# Patient Record
Sex: Male | Born: 1960 | Race: White | Hispanic: No | Marital: Married | Smoking: Former smoker
Health system: Southern US, Community
[De-identification: ages and names within clinical notes are randomized; demographics above are authoritative.]

## PROBLEM LIST (undated history)

## (undated) DIAGNOSIS — I2510 Atherosclerotic heart disease of native coronary artery without angina pectoris: Secondary | ICD-10-CM

## (undated) DIAGNOSIS — D351 Benign neoplasm of parathyroid gland: Secondary | ICD-10-CM

## (undated) DIAGNOSIS — E039 Hypothyroidism, unspecified: Secondary | ICD-10-CM

## (undated) DIAGNOSIS — E079 Disorder of thyroid, unspecified: Secondary | ICD-10-CM

## (undated) DIAGNOSIS — I10 Essential (primary) hypertension: Secondary | ICD-10-CM

## (undated) DIAGNOSIS — Z87891 Personal history of nicotine dependence: Secondary | ICD-10-CM

## (undated) DIAGNOSIS — M1990 Unspecified osteoarthritis, unspecified site: Secondary | ICD-10-CM

## (undated) DIAGNOSIS — G4730 Sleep apnea, unspecified: Secondary | ICD-10-CM

## (undated) DIAGNOSIS — I252 Old myocardial infarction: Secondary | ICD-10-CM

## (undated) DIAGNOSIS — E785 Hyperlipidemia, unspecified: Secondary | ICD-10-CM

## (undated) DIAGNOSIS — I472 Ventricular tachycardia, unspecified: Secondary | ICD-10-CM

## (undated) HISTORY — DX: Hyperlipidemia, unspecified: E78.5

## (undated) HISTORY — DX: Atherosclerotic heart disease of native coronary artery without angina pectoris: I25.10

## (undated) HISTORY — DX: Personal history of nicotine dependence: Z87.891

## (undated) HISTORY — PX: OTHER SURGICAL HISTORY: SHX169

## (undated) HISTORY — DX: Ventricular tachycardia, unspecified: I47.20

## (undated) HISTORY — DX: Ventricular tachycardia: I47.2

## (undated) HISTORY — DX: Old myocardial infarction: I25.2

## (undated) HISTORY — PX: CHOLECYSTECTOMY: SHX55

## (undated) HISTORY — DX: Benign neoplasm of parathyroid gland: D35.1

---

## 1998-03-22 HISTORY — PX: CARDIAC CATHETERIZATION: SHX172

## 1998-06-14 ENCOUNTER — Encounter: Payer: Self-pay | Admitting: Emergency Medicine

## 1998-06-14 ENCOUNTER — Inpatient Hospital Stay (HOSPITAL_COMMUNITY): Admission: AD | Admit: 1998-06-14 | Discharge: 1998-06-16 | Payer: Self-pay | Admitting: Cardiology

## 1998-06-14 ENCOUNTER — Emergency Department (HOSPITAL_COMMUNITY): Admission: EM | Admit: 1998-06-14 | Discharge: 1998-06-14 | Payer: Self-pay | Admitting: Emergency Medicine

## 2004-08-20 HISTORY — PX: CARDIAC CATHETERIZATION: SHX172

## 2004-09-03 ENCOUNTER — Ambulatory Visit: Payer: Self-pay | Admitting: Cardiology

## 2004-09-03 ENCOUNTER — Inpatient Hospital Stay (HOSPITAL_COMMUNITY): Admission: EM | Admit: 2004-09-03 | Discharge: 2004-09-07 | Payer: Self-pay | Admitting: *Deleted

## 2004-09-11 ENCOUNTER — Emergency Department: Payer: Self-pay | Admitting: Unknown Physician Specialty

## 2004-09-11 ENCOUNTER — Other Ambulatory Visit: Payer: Self-pay

## 2004-09-15 ENCOUNTER — Encounter: Payer: Self-pay | Admitting: Cardiology

## 2004-09-19 ENCOUNTER — Encounter: Payer: Self-pay | Admitting: Cardiology

## 2005-02-28 ENCOUNTER — Other Ambulatory Visit: Payer: Self-pay

## 2005-02-28 ENCOUNTER — Inpatient Hospital Stay: Payer: Self-pay | Admitting: Internal Medicine

## 2005-03-01 ENCOUNTER — Other Ambulatory Visit: Payer: Self-pay

## 2008-08-26 ENCOUNTER — Emergency Department: Payer: Self-pay | Admitting: Emergency Medicine

## 2008-11-20 HISTORY — PX: CARDIAC CATHETERIZATION: SHX172

## 2008-12-03 ENCOUNTER — Ambulatory Visit: Payer: Self-pay | Admitting: Internal Medicine

## 2008-12-03 ENCOUNTER — Inpatient Hospital Stay (HOSPITAL_COMMUNITY): Admission: EM | Admit: 2008-12-03 | Discharge: 2008-12-07 | Payer: Self-pay | Admitting: Emergency Medicine

## 2009-11-11 ENCOUNTER — Ambulatory Visit: Payer: Self-pay | Admitting: Cardiology

## 2010-05-15 ENCOUNTER — Ambulatory Visit: Payer: Self-pay | Admitting: Cardiology

## 2010-05-17 ENCOUNTER — Observation Stay: Payer: Self-pay | Admitting: Internal Medicine

## 2010-05-20 ENCOUNTER — Ambulatory Visit (INDEPENDENT_AMBULATORY_CARE_PROVIDER_SITE_OTHER): Payer: 59 | Admitting: Cardiology

## 2010-05-20 DIAGNOSIS — I472 Ventricular tachycardia: Secondary | ICD-10-CM

## 2010-05-20 DIAGNOSIS — I4949 Other premature depolarization: Secondary | ICD-10-CM

## 2010-05-20 DIAGNOSIS — I4729 Other ventricular tachycardia: Secondary | ICD-10-CM

## 2010-05-20 DIAGNOSIS — I2510 Atherosclerotic heart disease of native coronary artery without angina pectoris: Secondary | ICD-10-CM

## 2010-06-26 LAB — COMPREHENSIVE METABOLIC PANEL
Alkaline Phosphatase: 75 U/L (ref 39–117)
BUN: 16 mg/dL (ref 6–23)
Chloride: 104 mEq/L (ref 96–112)
GFR calc non Af Amer: >60 mL/min (ref >60)
Glucose, Bld: 188 mg/dL — ABNORMAL HIGH (ref 70–99)
Potassium: 4.1 mEq/L (ref 3.5–5.1)
Total Bilirubin: 0.9 mg/dL (ref 0.3–1.2)

## 2010-06-26 LAB — CBC
HCT: 41.3 % (ref 39.0–52.0)
Hemoglobin: 14.3 g/dL (ref 13.0–17.0)
MCHC: 34.7 g/dL (ref 30.0–36.0)
MCV: 97.7 fL (ref 78.0–100.0)
Platelets: 191 10*3/uL (ref 150–400)
RBC: 4.22 MIL/uL (ref 4.22–5.81)
RDW: 13 % (ref 11.5–15.5)
WBC: 7.4 10*3/uL (ref 4.0–10.5)

## 2010-06-26 LAB — DIFFERENTIAL
Basophils Absolute: 0 10*3/uL (ref 0.0–0.1)
Basophils Relative: 0 % (ref 0–1)
Neutro Abs: 4.8 10*3/uL (ref 1.7–7.7)
Neutrophils Relative %: 65 % (ref 43–77)

## 2010-06-26 LAB — GLUCOSE, CAPILLARY: Glucose-Capillary: 136 mg/dL — ABNORMAL HIGH (ref 70–99)

## 2010-06-26 LAB — PROTIME-INR
INR: 1 (ref 0.00–1.49)
Prothrombin Time: 13.1 seconds (ref 11.6–15.2)

## 2010-08-07 NOTE — Discharge Summary (Signed)
NAMEESSEX, PERRY NO.:  [id number]   MEDICAL RECORD NO.:  [id number]          PATIENT TYPE:  INP   LOCATION:  2015                         FACILITY:  [facility]   PHYSICIAN:  [name]. Swaziland, M.D.  DATE OF BIRTH:  November 09, 1960   DATE OF ADMISSION:  09/03/2004  DATE OF DISCHARGE:  09/07/2004                                 DISCHARGE SUMMARY   HISTORY OF PRESENT ILLNESS:  Mr. Ladona Ridgel is a 50 year old white male who has  known history of coronary artery disease. He had had previous stenting of  the proximal right coronary artery and distal right coronary artery. He has  a history of diabetes mellitus type 2 and dyslipidemia. He has not had any  recent cardiac follow-up. He presented to the emergency room on 09/03/2004  with complaints of chest pain involving his left upper chest and a focal  area in his left anterior shoulder. Initial ECG showed minimal ST-segment  changes. The patient became pain-free without treatment. However, he was  subsequently noted on the monitor to have significant ST elevation and  repeat ECG showed marked ST elevation in the inferior leads consistent with  acute inferior myocardial infarction. The patient was admitted for further  treatment. For details of his past medical history, social history, family  history, and physical exam, please see admission history and physical.   LABORATORY DATA:  Hemoglobin was 14.4, hematocrit 41.4, white count 6400,  platelets 191,000. Sodium 140, potassium 4.0, chloride 108, CO2 28, BUN 11,  creatinine 1.0, glucose of 164. Coag's were normal, liver function studies  were normal with the exception of AST of 63, albumin was 3.6. Calcium was  8.2. TSH was normal at 4.971. Initial point of care cardiac enzymes showed a  myoglobin of 82.9, CPK MB was less than 1, and troponin was less than 0.05.  Subsequent serial cardiac enzymes showed a peak CPK of 1099 with 149.6 MB.  Troponin increased to a peak of 32.35  before both of these values and  declined further. Serum cholesterol was 146 with an LDL 68, HDL 28, and  triglycerides of 250.   HOSPITAL COURSE:  The patient was treated in the emergency room with  aspirin, IV heparin, and IV nitroglycerin. He was taken emergently to the  cardiac catheterization laboratory where coronary angiography showed mild  nonobstructive disease throughout the left coronary system. He had a total  occlusion of the distal right coronary artery prior to the PDA. The proximal  stented segment was still patent. He was started on Integrilin and underwent  intervention of the distal right coronary artery. This was opened and  stented with a 2.5 x 24 mm Taxus stent and then in overlapping fashion,  slightly more proximal a 2.75 x 20 mm Taxus stent. An excellent angiographic  result was obtained with TIMI grade 3 flow and no evidence of distal cut-off  or thrombus. The patient's pain resolved, his ST-segment resolved. He had no  significant groin complications. His subsequent ECG shows resolution of his  ST elevation but he did have development of Q-waves in leads III and  aVF.  The patient was kept on Integrilin for 18 hours. His IV nitroglycerin was  weaned off. He had no recurrent chest pain. He had no significant  arrhythmias. He was treated with increased beta blocker therapy. An ACE  inhibitor was begun. He was placed on high dose Statin therapy. His  Glucophage was held until the time of discharge, at which time it was  resumed. The patient had no significant arrhythmias. It was felt at this  point, he was stable for discharge and he was discharged home on 09/07/2004.   DISCHARGE DIAGNOSES:  1.  Acute inferior myocardial infarction.  2.  Adult onset diabetes mellitus.  3.  Dyslipidemia.   DISCHARGE MEDICATIONS:  1.  Enteric-coated aspirin 325 milligrams daily.  2.  Plavix 75 milligrams per day.  3.  Altace 2.5 milligrams daily.  4.  Metoprolol 50 milligrams  twice a day.  5.  Effexor 150 milligrams daily.  6.  Lipitor were 80 milligrams per day.  7.  Niaspan 500 milligrams q.h.s.  8.  Actos and metformin 15/500 milligrams twice a day.  9.  Nitroglycerin p.r.n.   DISCHARGE INSTRUCTIONS:  The patient was referred to phase II cardiac rehab,  but states that he would be able to do this exercise on his own at home. He  recommended he remain on carbohydrate-modified, low-fat diet. He may not  return to work or drive for 2 weeks and will be reassessed at that point. If  he is able to return to work, he will need to have restrictions on lifting,  hours, and exposure to heat. He will follow-up with Dr. Swaziland in 2 weeks.       PMJ/MEDQ  D:  09/07/2004  T:  09/07/2004  Job:  [id number]   cc:   Julieanne Manson  [address]  [place]  [place] [address]  Fax: [phone]   Carole Binning, M.D. [facility]

## 2010-08-07 NOTE — H&P (Signed)
NAMEFELICIA, BOTH NO.:  [id number]   MEDICAL RECORD NO.:  [id number]          PATIENT TYPE:  INP   LOCATION:  1823                         FACILITY:  [facility]   PHYSICIAN:  Rollene Rotunda, M.D.   DATE OF BIRTH:  Mar 25, 1960   DATE OF ADMISSION:  09/03/2004  DATE OF DISCHARGE:                                HISTORY & PHYSICAL   REASON FOR PRESENTATION:  Chest pain.   HISTORY OF PRESENT ILLNESS:  The patient is a pleasant 50 year old gentleman  with a past history of coronary artery disease and an acute coronary  syndrome in 1999.  At that time, he had 80% proximal and 99% distal right  coronary stenosis (before the PDA.  He received two stents.  Following this,  he did very well.  He stopped smoking.  He has had follow up stress tests,  the most recent he reports three years ago.  This apparently was a nuclear  study which was negative.  He has had no symptoms.  He remains active.  He  works as a Chiropodist.  With this, he denies any chest discomfort,  shortness of breath, or other anginal symptoms.  Today, after eating lunch,  he felt some left upper chest discomfort.  It was slightly different than  his previous angina.  It was 2 out of 10 in intensity.  However, he had some  radiation to his jaw.  His wife informs me that he probably had some of this  over the weekend, as well.  He presented to the emergency room  where he  spontaneously became pain free.  Initial EKG showed some very mild inferior  ST elevation that was very subtle.  However, follow up EKG blossomed into 3-  4 mm ST elevation in 2, 3, and AVF, 1-2 mm ST elevation in v4, v5, with  reciprocal ST depression in the anterior precordial leads.   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   PAST SURGICAL HISTORY:  Cholecystectomy, parathyroidectomy for  hypercalcemia, tonsillectomy.   ALLERGIES:   Penicillin.   MEDICATIONS:  Lipitor 10 mg daily, Metoprolol b.i.d., aspirin, Effexor 150  mg daily, Actos Plus (the patient does not recall all the doses).   SOCIAL HISTORY:  The patient is married, he has three children.  He works as  a Copywriter, advertising for [employer].  He quit smoking in 1999.   FAMILY HISTORY:  Noncontributory for early coronary artery disease.   REVIEW OF SYMPTOMS:  As stated in the HPI and negative for all other  systems.   PHYSICAL EXAMINATION:  GENERAL:  The patient is in no distress.  He has an appropriate affect.  VITAL SIGNS:  Blood pressure 120/70, heart rate 80 and regular, afebrile,  HEENT:  Eyes unremarkable.  Pupils equal, round, reactive to light.  Fundi  not visualized.  Oral mucosa unremarkable.  NECK:  No jugular venous distention at 45 degrees, carotid upstroke brisk  and symmetric, no bruits, no thyromegaly.  LYMPHATICS:  No cervical, axillary, or inguinal adenopathy.  LUNGS:  Clear to auscultation bilaterally.  BACK:  No costovertebral angle tenderness.  CHEST:  Unremarkable.  HEART:  PMI non displaced or sustained.  S1 and S2 within normal limits.  No  S3, S4, no rubs.  ABDOMEN:  Flat, positive bowel sounds, normal in frequency and pitch, no  rebound, no guarding, no midline pulsatile mass, no organomegaly.  SKIN:  No rashes.  EXTREMITIES:  2+ pulses throughout, no edema, no cyanosis, no clubbing.  NEUROLOGICAL:  Oriented to person, place and time.  Cranial nerves 2-12  grossly intact.  Motor grossly intact.   LABORATORY DATA:  EKG as described above.  Chest x-ray no air space disease.  WBC 6.4, hemoglobin 14.4, platelets 199, INR 1, sodium 138, potassium 4.4,  BUN 18, creatinine 1, initial point of care markers negative.   ASSESSMENT/PLAN:  1.  Acute inferior myocardial infarction.  The patient will be taken      urgently to the cardiac catheterization lab.  The risks and benefits      have been described.  He is being managed with aspirin and  heparin      currently.  He will receive Plavix and 2B3A inhibitors as needed in the      lab.  2.  Diabetes mellitus.  The patient will continue on his oral medicines      holding Glucophage the next 48 hours, we will cover him with sliding      scale insulin.  3.  Dyslipidemia.  We will continue his Lipitor and have a lipid profile.  4.  The patient will be followed by Dr. Swaziland.       JH/MEDQ  D:  09/03/2004  T:  09/03/2004  Job:  [id number]   cc:   Julieanne Manson  [address]  [place]  [place] [address]  Fax: [phone]   [name]. Swaziland, M.D.  [address]  [place], [place] [address]  Fax: [phone]

## 2010-08-07 NOTE — Cardiovascular Report (Signed)
[name], [name] NO.:  [id number]   MEDICAL RECORD NO.:  [id number]          PATIENT TYPE:  INP   LOCATION:  2922                         FACILITY:  [facility]   PHYSICIAN:  Carole Binning, M.D. LHCDATE OF BIRTH:  1961/01/22   DATE OF PROCEDURE:  09/03/2004  DATE OF DISCHARGE:                              CARDIAC CATHETERIZATION   PROCEDURE PERFORMED:  1.  Left heart catheterization with coronary angiography and left      ventriculography.  2.  PTCA with placement of drug-eluting stents x2 in the distal right      coronary.   INDICATIONS:  The patient is a 43-year male with history of coronary disease  and previous stents to the right coronary in 2000. He presented to the  emergency room this afternoon with substernal chest pain. EKG was consistent  with an acute inferolateral wall myocardial infarction. The patient was  brought emergently to the cardiac catheterization laboratory.   CATHETERIZATION PROCEDURE NOTE:  A 6-French sheath was placed in the right  femoral artery. Coronary angiography was performed using a standard Judkins  6-French catheters. Left ventriculography was performed with an angled  pigtail catheter. Contrast was Omnipaque. There were no complications.   HEMODYNAMIC RESULTS:  Left ventricular pressure 110/25. Aortic pressure  112/74. There is no aortic valve gradient.   LEFT VENTRICULOGRAM:  There is moderate akinesis of the inferior wall.  Ejection fraction calculated at 45%. There is no mitral regurgitation.   CORONARY ARTERIOGRAPHY:  1.  Left main is normal.  2.  Left anterior descending artery has a 30% stenosis mid vessel. LAD gives      rise to single small diagonal branch.  3.  Left circumflex gives rise to a large first obtuse marginal, a normal-      sized bifurcating second obtuse marginal branch. There is a diffuse 30%      stenosis in the mid to distal circumflex.  4.  Right coronary is a dominant vessel. There is a  60% stenosis in the      proximal vessel. In the mid vessel there is a stent with approximately      50% stenosis within the stent. In the distal vessel there is a long 70%      stenosis which begins proximal to the stent and extends to within the      previously placed in the distal vessel. The distal vessels is 100%      occluded at the distal edge of the previously placed stent. There is      TIMI 0 flow beyond it. Prior to occlusion, the distal right coronary      gives rise to large acute marginal branch which supplies the apical      portion of the inferior septum. Distal to the occlusion, the right      coronary gives rise to a small posterior descending artery and a large      posterolateral branch.   IMPRESSION:  1.  Mildly decreased left ventricular systolic function secondary to the      acute inferolateral wall myocardial infarction.  2.  One-vessel coronary disease characterized by 100% occlusion of the      distal right coronary.   PLAN:  Percutaneous coronary intervention of the right coronary.   PTCA PROCEDURE NOTE:  We utilized a 6-French sheath in the right femoral  artery. Heparin and Integrilin were administered per protocol. We used a 6-  [place] JR-4 guiding catheter. A Hi-Torque floppy wire was advanced under  fluoroscopic guidance beyond the occlusion. The distal portion of this wire  was advanced into the posterior descending artery. We then performed a  single pass with an export thrombectomy catheter. This was successful in  reestablishing perfusion into the distal vessel. We then advanced an [employer]  soft coronary guidewire beyond the distal right coronary into the large  posterolateral branch. We then performed PTCA of the distal right coronary  over the [facility] wire with a 2.5 x 20-mm Maverick balloon inflated to 20  atmospheres. We then removed our Hi-Torque floppy wire from the posterior  descending artery. We advanced a 2.5 x 24 mm Taxus drug-eluting stent  and  positioned this across the more distal aspect of the disease extending the  stent into the distal AV groove portion of the right coronary. The stent was  deployed at 9 atmospheres. We then went in with a 2.5 x 15 mm Quantum  balloon and inflated this to 15 atmospheres in the distal aspect of stent  and 20 atmospheres in the proximal aspect of stent. The stent did overlap  with the old previously placed stent in the right coronary. Following this,  we advanced a 2.75 x 20 mm Taxus drug-eluting stent and positioned this  proximal to the first stent in the distal vessel with overlap of the just  placed Taxus stent. We deployed the stent at 16 atmospheres. We then went  back with a 3.0 x 20 mm Quantum balloon and inflated this to 20 atmospheres  within the newly placed stent as well as 20 atmospheres in the area of  overlap of the two stents. Intermittent doses of intracoronary nitroglycerin  were administered. Final angiographic images were obtained showing patency  right coronary with 0% residual stenosis at the stent site and TIMI III flow  into the distal vessel.   COMPLICATIONS:  None.   RESULTS:  Successful percutaneous transluminal coronary angioplasty with  placement of two drug-eluting stents in the distal right coronary. A 100%  occlusion with TIMI zero flow was reduced to 0% residual with TIMI III flow.   PLAN:  Integrilin will be continued for 24 hours. The patient will be  treated with Plavix for a minimum of 12 months. He would also benefit from  aggressive risk factor modification.       MWP/MEDQ  D:  09/03/2004  T:  09/03/2004  Job:  [id number]   cc:   Rollene Rotunda, M.D.

## 2010-10-27 ENCOUNTER — Other Ambulatory Visit: Payer: Self-pay | Admitting: *Deleted

## 2010-10-27 MED ORDER — SOTALOL HCL 120 MG PO TABS: 120.0000 mg | ORAL_TABLET | Freq: Two times a day (BID) | ORAL | Status: DC

## 2010-10-27 NOTE — Telephone Encounter (Signed)
escribe medication per fax request  

## 2011-01-07 ENCOUNTER — Other Ambulatory Visit: Payer: Self-pay | Admitting: Cardiology

## 2011-01-11 ENCOUNTER — Other Ambulatory Visit: Payer: Self-pay

## 2011-01-25 ENCOUNTER — Other Ambulatory Visit: Payer: Self-pay | Admitting: Cardiology

## 2011-04-22 ENCOUNTER — Other Ambulatory Visit: Payer: Self-pay | Admitting: Cardiology

## 2011-05-17 IMAGING — CT CT CHEST W/ CM
1 series · 16 of 33 positions shown, 20 images · IV contrast (APPLIED)
Comparison: none

REASON FOR EXAM: chest pain elevated d diamer
COMMENTS:

PROCEDURE:     CT  - CT CHEST (FOR PE) W  - May 16, 2010  [DATE]
RESULT:
TECHNIQUE: Helical 3 mm sections were obtained from the thoracic inlet
through the lung bases status post intravenous administration of 100 ml of
Msovue-[id number].

[Series 4: soft tissue · axial · 0.74mm/px · z∈[-278,+22]mm · 16 of 108 slices shown, 20 images]
[im 4/108  mediastinal]
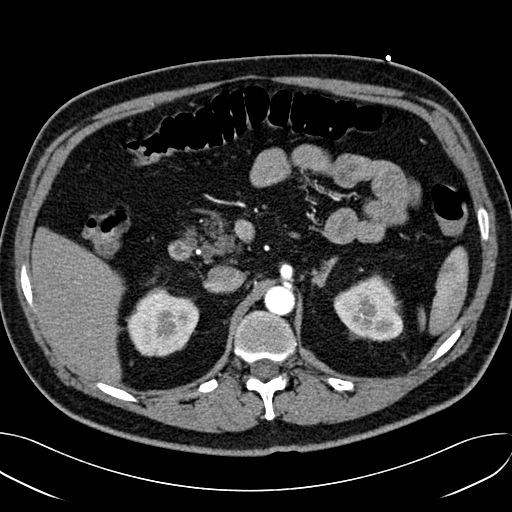
[im 4/108  lung]
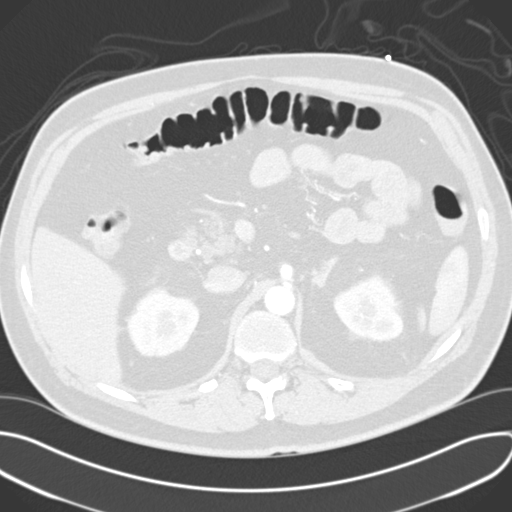
[im 12/108  lung]
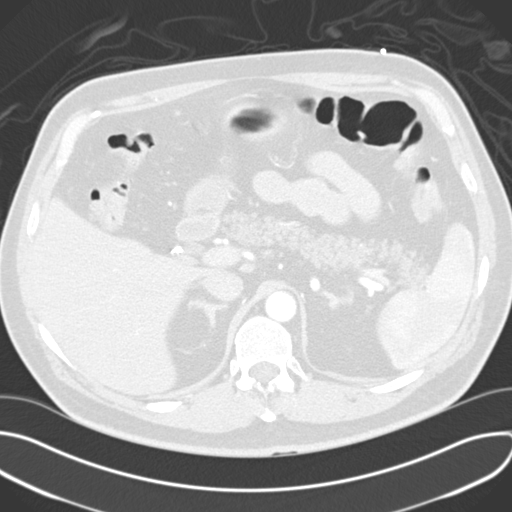
[im 20/108  lung]
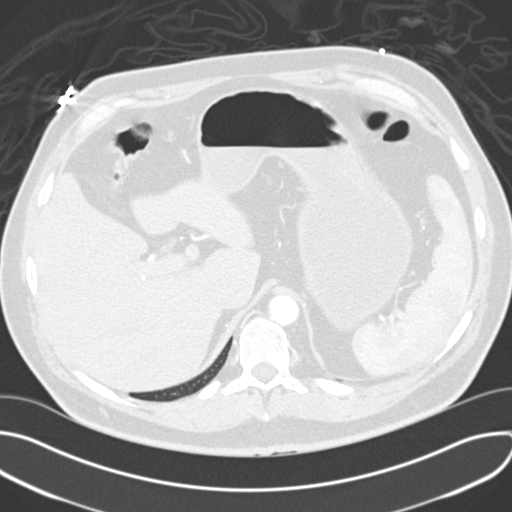
[im 24/108  lung]
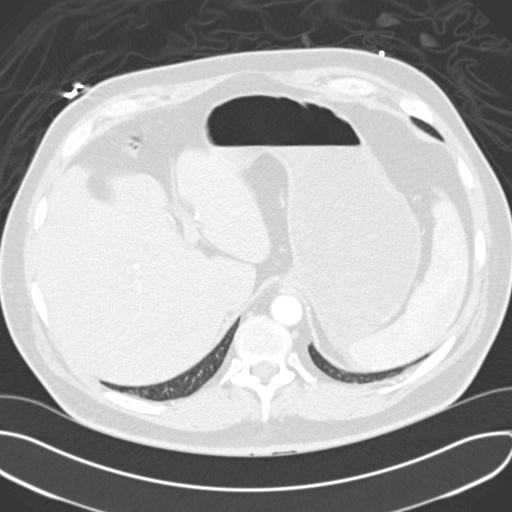
[im 32/108  mediastinal]
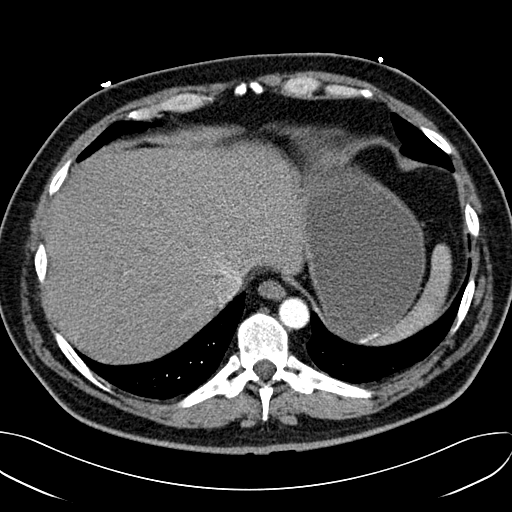
[im 32/108  lung]
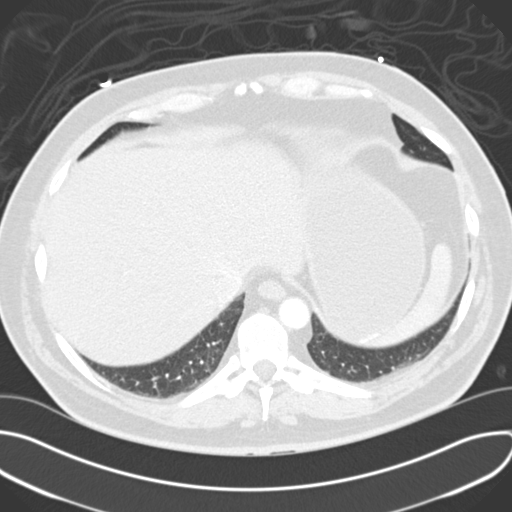
[im 40/108  lung]
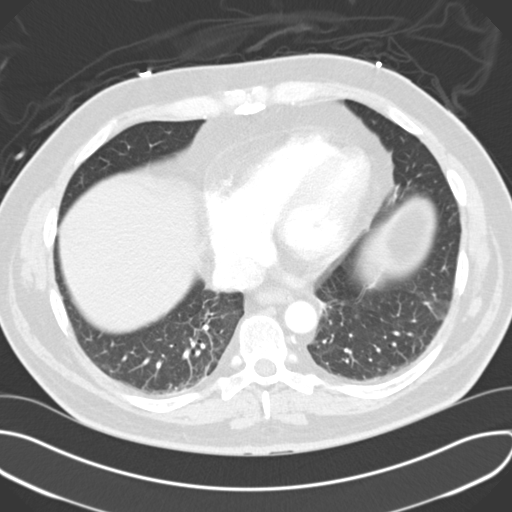
[im 44/108  lung]
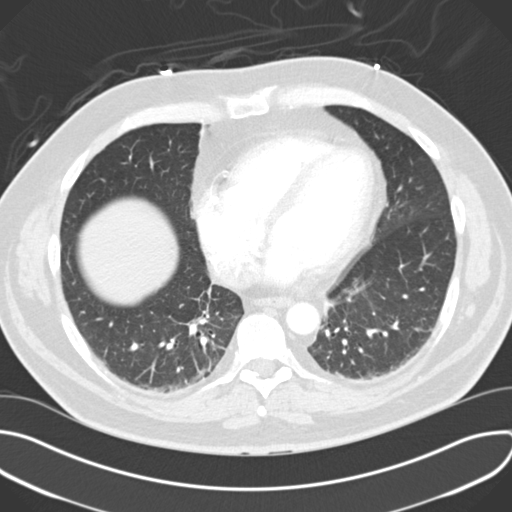
[im 52/108  lung]
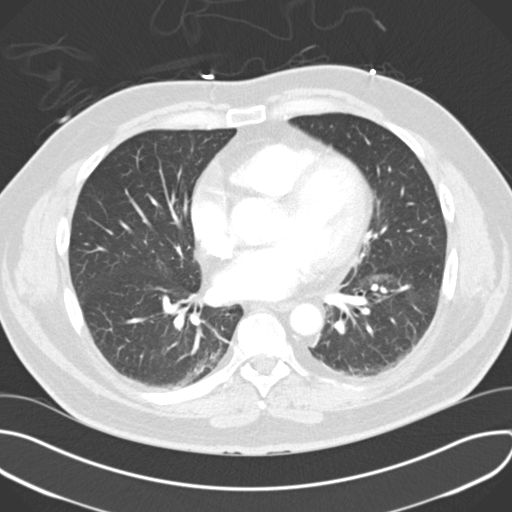
[im 57/108  mediastinal]
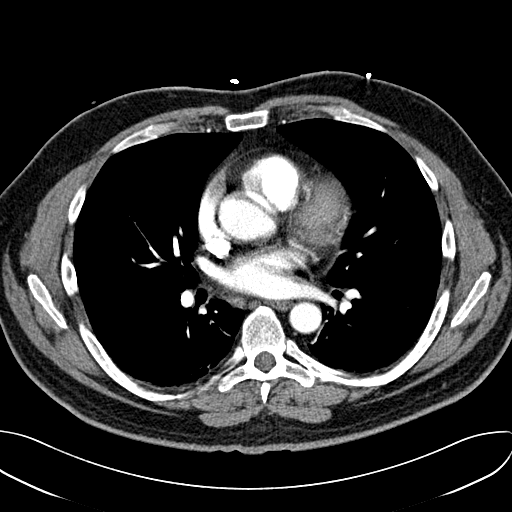
[im 57/108  lung]
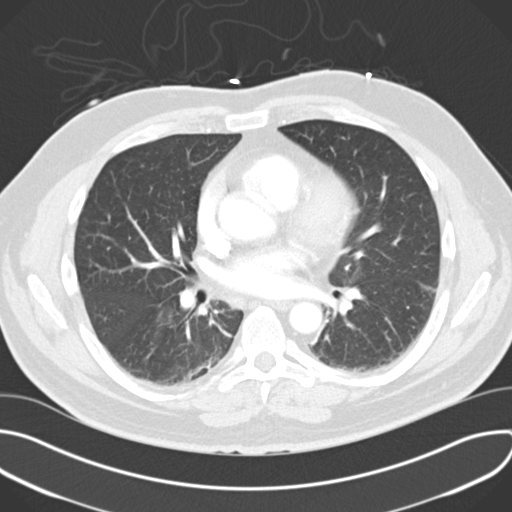
[im 64/108  lung]
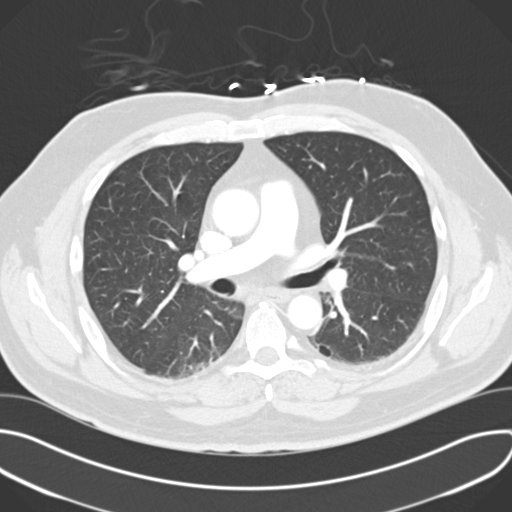
[im 68/108  lung]
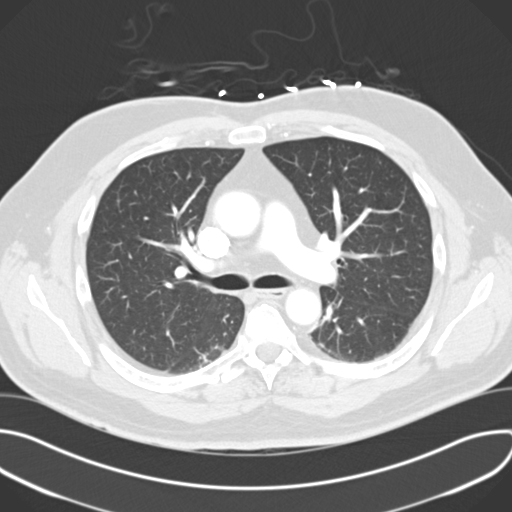
[im 76/108  lung]
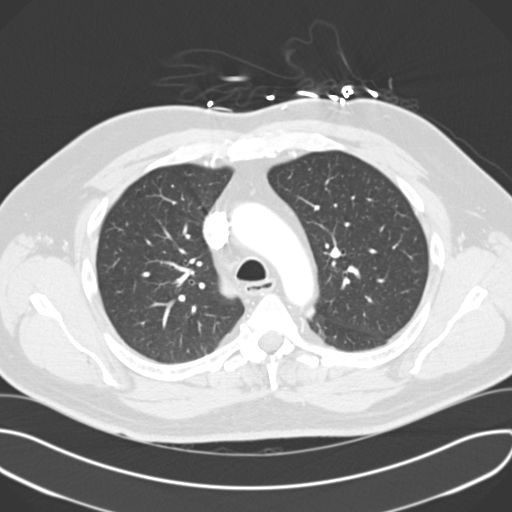
[im 84/108  mediastinal]
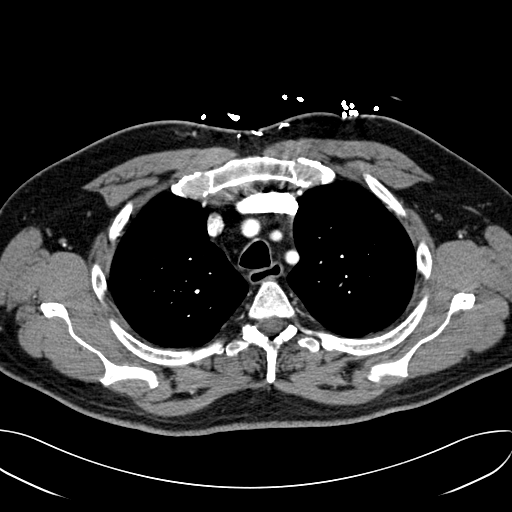
[im 84/108  lung]
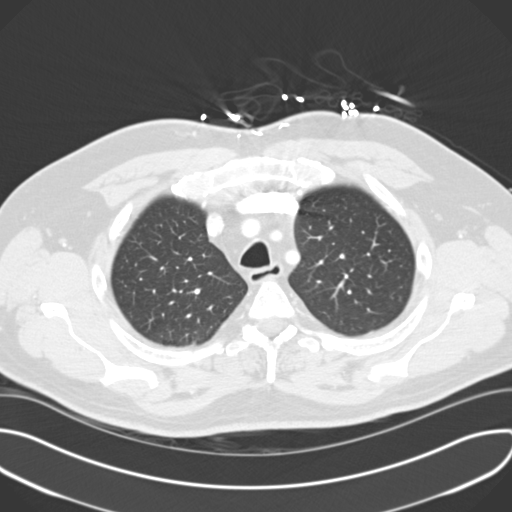
[im 88/108  lung]
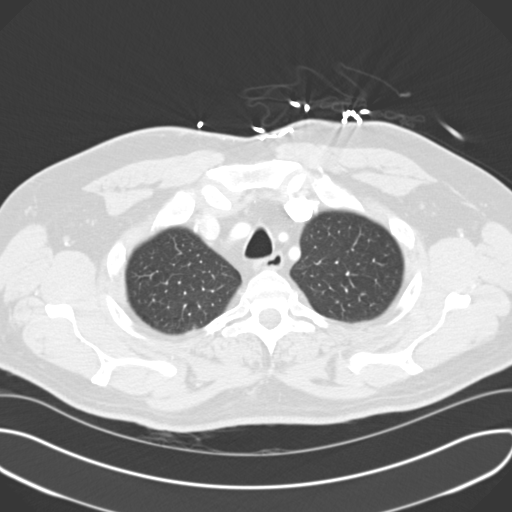
[im 96/108  lung]
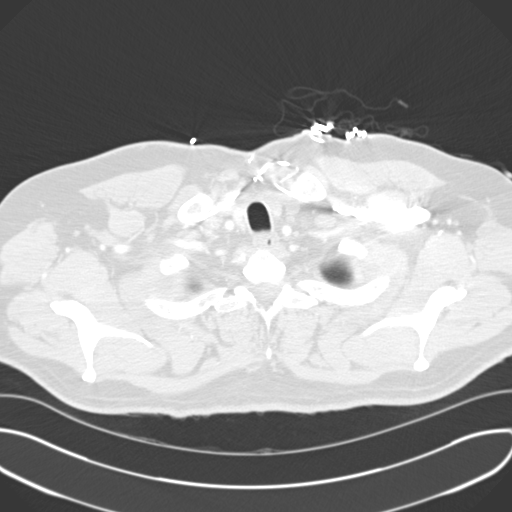
[im 104/108  lung]
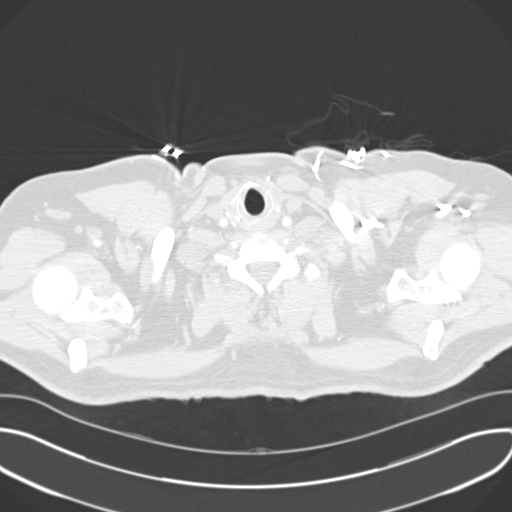

[16 of 33 positions shown; findings below may reference images not displayed]

FINDINGS: Evaluation of the mediastinum and hilar regions and structures
demonstrates no evidence of mediastinal or hilar adenopathy nor masses.
There is no evidence of filling defects within the main, lobar, or segmental
pulmonary arteries. The lung parenchyma demonstrates no evidence of focal
infiltrates, effusions or edema. Hypoventilation is appreciated within the
right lung base. The visualized upper abdominal viscera demonstrate no gross
abnormalities.
IMPRESSION: 1. No CT evidence of pulmonary arterial embolic disease.
2. No evidence of focal or acute abnormalities.

Dr. Paetzel of the Emergency Department was informed of these findings via
a preliminary faxed report.

## 2011-06-29 ENCOUNTER — Encounter: Payer: Self-pay | Admitting: Cardiology

## 2011-06-29 ENCOUNTER — Ambulatory Visit (INDEPENDENT_AMBULATORY_CARE_PROVIDER_SITE_OTHER): Payer: 59 | Admitting: Cardiology

## 2011-06-29 VITALS — BP 118/72 | HR 69 | Ht 70.0 in | Wt 211.0 lb

## 2011-06-29 DIAGNOSIS — I2510 Atherosclerotic heart disease of native coronary artery without angina pectoris: Secondary | ICD-10-CM

## 2011-06-29 DIAGNOSIS — I4729 Other ventricular tachycardia: Secondary | ICD-10-CM

## 2011-06-29 DIAGNOSIS — I252 Old myocardial infarction: Secondary | ICD-10-CM

## 2011-06-29 DIAGNOSIS — I472 Ventricular tachycardia: Secondary | ICD-10-CM | POA: Insufficient documentation

## 2011-06-29 DIAGNOSIS — E1169 Type 2 diabetes mellitus with other specified complication: Secondary | ICD-10-CM | POA: Insufficient documentation

## 2011-06-29 DIAGNOSIS — E785 Hyperlipidemia, unspecified: Secondary | ICD-10-CM

## 2011-06-29 DIAGNOSIS — E119 Type 2 diabetes mellitus without complications: Secondary | ICD-10-CM

## 2011-06-29 MED ORDER — ASPIRIN 325 MG PO TBEC: 325.0000 mg | DELAYED_RELEASE_TABLET | Freq: Every day | ORAL | Status: DC

## 2011-06-29 NOTE — Assessment & Plan Note (Signed)
His ventricular tachycardia is well controlled on his current dose of Betapace. I suspect that some of his episodes of lightheadedness are related to recurrent arrhythmia but these only last a couple of seconds at most. His QTC is normal. We will continue on his current therapy.

## 2011-06-29 NOTE — Progress Notes (Signed)
[name] Date of Birth: [birth year] Medical Record #[id number]  History of Present Illness: [name] is seen for yearly followup. He has a significant history of coronary disease. He had his first myocardial infarction at age 51. At that time he had stenting of the proximal right coronary with a 4.0 x 13 mm MultiLink stent in the distal vessel with a 3.0 x 8 mm MultiLink stent. In June of 2010 he again had an acute inferior myocardial infarction related to occlusion of the distal right coronary. The distal vessel was stented with a 2.5 x 24 mm Taxus stent and a 2.75 x 20 mm Taxus stent at the crux. He had followup cardiac catheterization in September of 2010 which demonstrated nonobstructive disease and ejection fraction of 55%. He also has a history of symptomatic monomorphic ventricular tachycardia that has been treated with Betapace therapy. On followup today he reports that he is doing well. Occasionally he will get a strange feeling in his stomach like riding a roller coaster. He feels a little bit lightheaded. It only lasts 1-2 seconds. He has had no chest pain. He denies any dyspnea. He admits that he is not doing well with his diet and eating a lot of sweets. His last A1c was 8% in January. He cannot recall when his lipids were last checked. He has not been exercising to any extent.   Current Outpatient Prescriptions on File Prior to Visit  Medication Sig Dispense Refill  . atorvastatin (LIPITOR) 80 MG tablet TAKE 1 TABLET AT BEDTIME  90 tablet  2  . ramipril (ALTACE) 2.5 MG capsule TAKE 1 CAPSULE DAILY  90 capsule  2  . sotalol (BETAPACE) 120 MG tablet TAKE 1 TABLET TWICE A DAY  180 tablet  2    Allergies  Allergen Reactions  . Crestor (Rosuvastatin Calcium)     ITCHING AND PALPITATIONS  . Penicillins     Past Medical History  Diagnosis Date  . Coronary artery disease   . Ventricular tachycardia     Symptomatic ventricular tachycardia  . Old Inferior Myocardial Infarction     . Diabetes mellitus   . Dyslipidemia   . History of tobacco use   . Gout   . Parathyroid adenoma     Past Surgical History  Procedure Date  . Cardiac catheterization     Ejection fraction is estimated at 55%  . Other surgical history     Status post removal of parathyroid adenoma  . Cholecystectomy     History  Smoking status  . Former Smoker  Smokeless tobacco  . Not on file    History  Alcohol Use     History reviewed. No pertinent family history.  Review of Systems: As noted in history of present illness.  All other systems were reviewed and are negative.  Physical Exam: BP 118/72  Pulse 69  Ht 5\' 10"  (1.778 m)  Wt 211 lb (95.709 kg)  BMI 30.28 kg/m2 He is an obese white male in no acute distress.The patient is alert and oriented x 3.  The mood and affect are normal.  The skin is warm and dry.  Color is normal.  The HEENT exam reveals that the sclera are nonicteric.  The mucous membranes are moist.  The carotids are 2+ without bruits.  There is no thyromegaly.  There is no JVD.  The lungs are clear.  The chest wall is non tender.  The heart exam reveals a regular rate with a normal S1 and  S2.  There are no murmurs, gallops, or rubs.  The PMI is not displaced.   Abdominal exam reveals good bowel sounds.  There is no guarding or rebound.  There is no hepatosplenomegaly or tenderness.  There are no masses.  Exam of the legs reveal no clubbing, cyanosis, or edema.  The legs are without rashes.  The distal pulses are intact.  Cranial nerves II - XII are intact.  Motor and sensory functions are intact.  The gait is normal.  LABORATORY DATA: ECG today demonstrates normal sinus rhythm with left axis deviation and evidence of old inferior myocardial infarction. He has poor weight progression in the anterior leads. His QTC is normal at 450 ms.  Assessment / Plan:

## 2011-06-29 NOTE — Patient Instructions (Signed)
You need to get regular aerobic exercise 4-5 days/week  Reduce your sweet intake.  Follow up with Dr. Sullivan Lone for your lab work.  Continue your medication  I will see you again in 1 year

## 2011-06-29 NOTE — Assessment & Plan Note (Signed)
Pardeep remains asymptomatic. He is on appropriate medications with aspirin, statin and ACE inhibitor. He is not on beta blocker therapy since he is on sotalol. He is really doing very little to modify his lifestyle. I stressed the importance of dietary modification with reduction in sweets intake. I recommended regular aerobic exercise at least 5 days per week. He needs to decrease his weight. I recommended that he have his lipids rechecked with his primary physician.

## 2011-09-10 ENCOUNTER — Other Ambulatory Visit: Payer: Self-pay | Admitting: *Deleted

## 2011-09-10 MED ORDER — ATORVASTATIN CALCIUM 80 MG PO TABS: 80.0000 mg | ORAL_TABLET | Freq: Every day | ORAL | Status: DC

## 2011-09-21 ENCOUNTER — Other Ambulatory Visit: Payer: Self-pay | Admitting: Cardiology

## 2011-10-05 ENCOUNTER — Encounter (HOSPITAL_COMMUNITY): Payer: Self-pay | Admitting: *Deleted

## 2011-10-05 ENCOUNTER — Inpatient Hospital Stay (HOSPITAL_COMMUNITY)
Admission: EM | Admit: 2011-10-05 | Discharge: 2011-10-06 | DRG: 287 | Disposition: A | Discharge status: Home / Self Care | Payer: 59 | Attending: Cardiovascular Disease | Admitting: Cardiovascular Disease

## 2011-10-05 ENCOUNTER — Emergency Department (HOSPITAL_COMMUNITY): Payer: 59

## 2011-10-05 DIAGNOSIS — Z880 Allergy status to penicillin: Secondary | ICD-10-CM

## 2011-10-05 DIAGNOSIS — E785 Hyperlipidemia, unspecified: Secondary | ICD-10-CM | POA: Diagnosis present

## 2011-10-05 DIAGNOSIS — E119 Type 2 diabetes mellitus without complications: Secondary | ICD-10-CM

## 2011-10-05 DIAGNOSIS — E039 Hypothyroidism, unspecified: Secondary | ICD-10-CM

## 2011-10-05 DIAGNOSIS — F419 Anxiety disorder, unspecified: Secondary | ICD-10-CM

## 2011-10-05 DIAGNOSIS — I472 Ventricular tachycardia, unspecified: Secondary | ICD-10-CM | POA: Diagnosis present

## 2011-10-05 DIAGNOSIS — I252 Old myocardial infarction: Secondary | ICD-10-CM

## 2011-10-05 DIAGNOSIS — R079 Chest pain, unspecified: Secondary | ICD-10-CM

## 2011-10-05 DIAGNOSIS — Z9861 Coronary angioplasty status: Secondary | ICD-10-CM

## 2011-10-05 DIAGNOSIS — E1169 Type 2 diabetes mellitus with other specified complication: Secondary | ICD-10-CM | POA: Diagnosis present

## 2011-10-05 DIAGNOSIS — I4729 Other ventricular tachycardia: Secondary | ICD-10-CM | POA: Diagnosis present

## 2011-10-05 DIAGNOSIS — I2510 Atherosclerotic heart disease of native coronary artery without angina pectoris: Secondary | ICD-10-CM | POA: Diagnosis present

## 2011-10-05 DIAGNOSIS — F411 Generalized anxiety disorder: Secondary | ICD-10-CM | POA: Diagnosis present

## 2011-10-05 DIAGNOSIS — R0789 Other chest pain: Principal | ICD-10-CM | POA: Diagnosis present

## 2011-10-05 DIAGNOSIS — Z87891 Personal history of nicotine dependence: Secondary | ICD-10-CM

## 2011-10-05 HISTORY — DX: Essential (primary) hypertension: I10

## 2011-10-05 HISTORY — DX: Unspecified osteoarthritis, unspecified site: M19.90

## 2011-10-05 HISTORY — DX: Sleep apnea, unspecified: G47.30

## 2011-10-05 HISTORY — DX: Disorder of thyroid, unspecified: E07.9

## 2011-10-05 HISTORY — DX: Hypothyroidism, unspecified: E03.9

## 2011-10-05 LAB — CBC
MCH: 33.2 pg (ref 26.0–34.0)
MCHC: 35.8 g/dL (ref 30.0–36.0)
Platelets: 165 10*3/uL (ref 150–400)

## 2011-10-05 LAB — BASIC METABOLIC PANEL
Calcium: 8.4 mg/dL (ref 8.4–10.5)
Creatinine, Ser: 0.81 mg/dL (ref 0.50–1.35)
GFR calc non Af Amer: >90 mL/min (ref >90)
Glucose, Bld: 264 mg/dL — ABNORMAL HIGH (ref 70–99)
Sodium: 138 mEq/L (ref 135–145)

## 2011-10-05 LAB — LIPID PANEL
Cholesterol: 108 mg/dL (ref 0–200)
Total CHOL/HDL Ratio: 2.9 RATIO
Triglycerides: 150 mg/dL — ABNORMAL HIGH (ref <150)

## 2011-10-05 LAB — CARDIAC PANEL(CRET KIN+CKTOT+MB+TROPI)
Relative Index: INVALID (ref 0.0–2.5)
Total CK: 82 U/L (ref 7–232)
Total CK: 81 U/L (ref 7–232)
Troponin I: <0.3 ng/mL (ref <0.30)
Troponin I: <0.3 ng/mL (ref <0.30)

## 2011-10-05 LAB — POCT I-STAT TROPONIN I: Troponin i, poc: 0 ng/mL (ref 0.00–0.08)

## 2011-10-05 LAB — GLUCOSE, CAPILLARY
Glucose-Capillary: 178 mg/dL — ABNORMAL HIGH (ref 70–99)
Glucose-Capillary: 239 mg/dL — ABNORMAL HIGH (ref 70–99)
Glucose-Capillary: 174 mg/dL — ABNORMAL HIGH (ref 70–99)

## 2011-10-05 LAB — MAGNESIUM: Magnesium: 1.3 mg/dL — ABNORMAL LOW (ref 1.5–2.5)

## 2011-10-05 MED ORDER — HEPARIN (PORCINE) IN NACL 100-0.45 UNIT/ML-% IJ SOLN
1100.0000 [IU]/h | INTRAMUSCULAR | Status: DC
  Administered 2011-10-05: 1100 [IU]/h via INTRAVENOUS
  Filled 2011-10-05 (×2): qty 250

## 2011-10-05 MED ORDER — SODIUM CHLORIDE 0.9 % IV SOLN
INTRAVENOUS | Status: DC
  Administered 2011-10-06: 04:00:00 via INTRAVENOUS

## 2011-10-05 MED ORDER — ASPIRIN 81 MG PO CHEW
324.0000 mg | CHEWABLE_TABLET | Freq: Once | ORAL | Status: AC
  Administered 2011-10-05: 324 mg via ORAL
  Filled 2011-10-05: qty 4

## 2011-10-05 MED ORDER — SODIUM CHLORIDE 0.9 % IJ SOLN: 3.0000 mL | INTRAMUSCULAR | Status: DC | PRN

## 2011-10-05 MED ORDER — ASPIRIN EC 81 MG PO TBEC
81.0000 mg | DELAYED_RELEASE_TABLET | Freq: Every day | ORAL | Status: DC
  Filled 2011-10-05: qty 1

## 2011-10-05 MED ORDER — SODIUM CHLORIDE 0.9 % IJ SOLN: 3.0000 mL | Freq: Two times a day (BID) | INTRAMUSCULAR | Status: DC

## 2011-10-05 MED ORDER — RAMIPRIL 2.5 MG PO CAPS
2.5000 mg | ORAL_CAPSULE | Freq: Every day | ORAL | Status: DC
  Filled 2011-10-05: qty 1

## 2011-10-05 MED ORDER — ONDANSETRON HCL 4 MG/2ML IJ SOLN
4.0000 mg | Freq: Four times a day (QID) | INTRAMUSCULAR | Status: DC | PRN
  Filled 2011-10-05: qty 2

## 2011-10-05 MED ORDER — ALPRAZOLAM 0.25 MG PO TABS: 0.2500 mg | ORAL_TABLET | Freq: Two times a day (BID) | ORAL | Status: DC | PRN

## 2011-10-05 MED ORDER — SODIUM CHLORIDE 0.9 % IV SOLN: 250.0000 mL | INTRAVENOUS | Status: DC | PRN

## 2011-10-05 MED ORDER — NITROGLYCERIN 0.4 MG SL SUBL: 0.4000 mg | SUBLINGUAL_TABLET | SUBLINGUAL | Status: DC | PRN

## 2011-10-05 MED ORDER — DIAZEPAM 5 MG PO TABS
5.0000 mg | ORAL_TABLET | ORAL | Status: AC
  Administered 2011-10-06: 5 mg via ORAL
  Filled 2011-10-05: qty 1

## 2011-10-05 MED ORDER — DIAZEPAM 5 MG PO TABS: 5.0000 mg | ORAL_TABLET | ORAL | Status: DC

## 2011-10-05 MED ORDER — ZOLPIDEM TARTRATE 5 MG PO TABS: 5.0000 mg | ORAL_TABLET | Freq: Every evening | ORAL | Status: DC | PRN

## 2011-10-05 MED ORDER — INSULIN ASPART 100 UNIT/ML ~~LOC~~ SOLN
0.0000 [IU] | Freq: Three times a day (TID) | SUBCUTANEOUS | Status: DC
  Administered 2011-10-05: 5 [IU] via SUBCUTANEOUS
  Administered 2011-10-06: 2 [IU] via SUBCUTANEOUS
  Filled 2011-10-05: qty 1

## 2011-10-05 MED ORDER — SOTALOL HCL 120 MG PO TABS
120.0000 mg | ORAL_TABLET | Freq: Two times a day (BID) | ORAL | Status: DC
  Administered 2011-10-05: 120 mg via ORAL
  Filled 2011-10-05 (×3): qty 1

## 2011-10-05 MED ORDER — HEPARIN BOLUS VIA INFUSION
4000.0000 [IU] | Freq: Once | INTRAVENOUS | Status: AC
  Administered 2011-10-05: 4000 [IU] via INTRAVENOUS

## 2011-10-05 MED ORDER — CLOPIDOGREL BISULFATE 300 MG PO TABS
600.0000 mg | ORAL_TABLET | Freq: Once | ORAL | Status: AC
  Administered 2011-10-05: 600 mg via ORAL
  Filled 2011-10-05: qty 2

## 2011-10-05 MED ORDER — ACETAMINOPHEN 325 MG PO TABS
650.0000 mg | ORAL_TABLET | ORAL | Status: DC | PRN
  Filled 2011-10-05: qty 2

## 2011-10-05 MED ORDER — ATORVASTATIN CALCIUM 80 MG PO TABS
80.0000 mg | ORAL_TABLET | Freq: Every day | ORAL | Status: DC
  Filled 2011-10-05: qty 1

## 2011-10-05 NOTE — ED Provider Notes (Signed)
I saw and evaluated the patient, reviewed the resident's note and I agree with the findings and plan.  Dollie Jonathan Corpus, MD 10/05/11 1600 

## 2011-10-05 NOTE — ED Provider Notes (Signed)
History     CSN: [id number]  Arrival date & time 10/05/11  7829   First MD Initiated Contact with Patient 10/05/11 [phone]      Chief Complaint  Patient presents with  . Chest Pain    (Consider location/radiation/quality/duration/timing/severity/associated sxs/prior treatment) HPI  Patient who has significant past medical history for coronary artery disease with stent placement as well as history of 2 myocardial infarctions with this first at age 51 the second in 2010 was followed by [facility] cardiology presents to emergency department complaining of a few day to one week history of intermittent discomfort in his left chest with associated "flushed feeling." Patient states that this morning he was lying in bed and had this intermittent fleeting discomfort in his left upper chest and would fell as if his chest "was flushed." Patient states that symptoms are fleeting. He denies associated fevers, chills, shortness of breath, diaphoresis, nausea, or vomiting. He denies abdominal pain. Patient took aspirin last night but has not had any aspirin today. Patient denies aggravating or alleviating factors. Patient denies any current pain.   Past Medical History  Diagnosis Date  . Coronary artery disease   . Ventricular tachycardia     Symptomatic ventricular tachycardia  . Old Inferior Myocardial Infarction   . Diabetes mellitus   . Dyslipidemia   . History of tobacco use   . Gout   . Parathyroid adenoma   . Thyroid disease     Past Surgical History  Procedure Date  . Cardiac catheterization     Ejection fraction is estimated at 55%  . Other surgical history     Status post removal of parathyroid adenoma  . Cholecystectomy     No family history on file.  History  Substance Use Topics  . Smoking status: Former Games developer  . Smokeless tobacco: Not on file  . Alcohol Use: No      Review of Systems  All other systems reviewed and are negative.    Allergies  Crestor and  Penicillins  Home Medications   Current Outpatient Rx  Name Route Sig Dispense Refill  . ASPIRIN 325 MG PO TABS Oral Take 325 mg by mouth daily.    . ATORVASTATIN CALCIUM 80 MG PO TABS Oral Take 1 tablet (80 mg total) by mouth daily. 90 tablet 3  . RAMIPRIL 2.5 MG PO CAPS Oral Take 2.5 mg by mouth daily.    Marland Kitchen SOTALOL HCL 120 MG PO TABS Oral Take 120 mg by mouth 2 (two) times daily.      BP 147/89  Pulse 77  Temp 98.4 F (36.9 C) (Oral)  Resp 20  SpO2 100%  Physical Exam  Nursing note and vitals reviewed. Constitutional: He is oriented to person, place, and time. He appears well-developed and well-nourished. No distress.  HENT:  Head: Normocephalic and atraumatic.  Eyes: Conjunctivae and EOM are normal. Pupils are equal, round, and reactive to light.  Neck: Normal range of motion. Neck supple.  Cardiovascular: Normal rate, regular rhythm, normal heart sounds and intact distal pulses.  Exam reveals no gallop and no friction rub.   No murmur heard. Pulmonary/Chest: Effort normal and breath sounds normal. No respiratory distress. He has no wheezes. He has no rales. He exhibits no tenderness.  Abdominal: Bowel sounds are normal. He exhibits no distension and no mass. There is no tenderness. There is no rebound and no guarding.  Musculoskeletal: Normal range of motion. He exhibits no edema and no tenderness.  Neurological: He is alert  and oriented to person, place, and time.  Skin: Skin is warm and dry. No rash noted. He is not diaphoretic. No erythema.  Psychiatric: He has a normal mood and affect.    ED Course  Procedures (including critical care time)  PO ASA  IV lock   Date: 10/05/2011  Rate: 76  Rhythm: normal sinus rhythm  QRS Axis: normal  Intervals: normal  ST/T Wave abnormalities: nonspecific T wave changes  Conduction Disutrbances:incomplete RBBB  Narrative Interpretation:   Old EKG Reviewed: non provocative EKG with no significant changes from Dec 05, 2008  9:45 AM Case discussed with Dr. Weldon Inches. Pending labs and chest xray. Will consult [facility] cards after labs result. Patient instructed to inform staff if return of chest discomfort.   Labs Reviewed  BASIC METABOLIC PANEL - Abnormal; Notable for the following:    Glucose, Bld 264 (*)     All other components within normal limits  CBC  POCT I-STAT TROPONIN I   Dg Chest 2 View  10/05/2011  *RADIOLOGY REPORT*  Clinical Data: Left upper chest pain, pressure.  CHEST - 2 VIEW  Comparison: 12/04/2008  Findings: Heart is upper limits normal in size.  Lungs are clear. No effusions.  No acute bony abnormality.  IMPRESSION: No active cardiopulmonary disease.  Original Report Authenticated By: Cyndie Chime, M.D.     1. Chest pain       MDM  Cards to admit for CP in setting of significant cardiac hx. No STEMI on EKG or NSTEMI with initial labs. CP free. VSS in ER.         [place], [place] 10/05/11 1212

## 2011-10-05 NOTE — ED Notes (Signed)
Cardiology at bedside.

## 2011-10-05 NOTE — Progress Notes (Signed)
ANTICOAGULATION CONSULT NOTE - Initial Consult  Pharmacy Consult for Heparin Indication: chest pain/ACS  Allergies  Allergen Reactions  . Crestor (Rosuvastatin Calcium)     ITCHING AND PALPITATIONS  . Penicillins     Patient Measurements: Height: 5\' 10"  (177.8 cm) Weight: 208 lb 12.8 oz (94.711 kg) IBW/kg (Calculated) : 73  Heparin Dosing Weight: 92 kg  Vital Signs: Temp: 98.1 F (36.7 C) (07/16 1700) Temp src: Oral (07/16 1234) BP: 126/74 mmHg (07/16 1700) Pulse Rate: 73  (07/16 1700)  Labs:  Basename 10/05/11 1902 10/05/11 1658 10/05/11 0933  HGB -- -- 14.3  HCT -- -- 39.9  PLT -- -- 165  APTT -- -- --  LABPROT 14.4 -- --  INR 1.10 -- --  HEPARINUNFRC 0.56 -- --  CREATININE -- -- 0.81  CKTOTAL -- 82 --  CKMB -- 1.4 --  TROPONINI -- <0.30 --    Estimated Creatinine Clearance: 121.9 ml/min (by C-G formula based on Cr of 0.81).   Medical History: Past Medical History  Diagnosis Date  . Coronary artery disease   . Ventricular tachycardia     Symptomatic ventricular tachycardia  . Old Inferior Myocardial Infarction   . Diabetes mellitus   . Dyslipidemia   . History of tobacco use   . Gout   . Parathyroid adenoma   . Thyroid disease      Assessment: 6hour heparin level = 0.56 on Heparin IV rate 1100 units/hr in this 51 yo male with PMH significant for CAD (first MI at age 79), VT, DM, hypothyroidism (recently started on Synthroid). Currently on heparin infusion for chest pain. The heparin level is therapeutic.  No bleeding noted.   Goal of Therapy:  Heparin level 0.3-0.7 units/ml Monitor platelets by anticoagulation protocol: Yes   Plan: Continue IV heparin drip 1100 units/hr.  Daily heparin level and CBC starting tomorrow morning.   Noah Delaine, RPh Clinical Pharmacist 10/05/2011,9:28 PM

## 2011-10-05 NOTE — ED Notes (Signed)
Pt has been getting flushed feeling in chest and a dull ache in left upper chest.  Pt states this has been intermittent for one week.  Pt sts he has had this before and his magnesium was low.  Pt states just started new thyroid medicine a few weeks ago and has had 2 MIs in the past

## 2011-10-05 NOTE — H&P (Signed)
History and Physical   Patient ID: [name] MRN: [id number], DOB/AGE: [birth year]   Admit date: 10/05/2011 Date of Consult: 10/05/2011   Primary Physician: Bosie Clos, MD Primary Cardiologist: [place], Leathia Farnell, MD  Pt. 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  PAST CARDIAC HISTORY  2000 cardiac cath (~12 years ago): pRCA 4.0 x 13 mm MultiLink stent, dRCA 3.0 x 8 mm MultiLink stent 08/2004 cardiac cath: acute inferior MI 2/2 dRCA occlusion s/p 2.5 x 24 mm Taxus stent and a 2.75 x 20 mm Taxus stent at the crux; LVEF 45%, moderate akinesis of the inferior wall 11/2008 cardiac cath: nonobstructive CAD, left main normal, LAD normal, minor LCx irregularities <10%, mRCA 30% ISR, dRCA 30% narrowing proximal to PDA and PLB bifurcation; stented segments widely patent, LVEF 55%; severe inferior basal wall hypokinesis  HPI:   He was last seen by Dr. [place] in clinic in 06/2011 for yearly follow-up. Symptomatically, he had been doing well noting occasional abdominal discomfort described as "riding a roller coaster" and lightheadedness lasting 1-2 seconds. The latter was believed to represent an intermittent, transient, self-limiting arrhythmia (NSVT?). He denies chest pain or SOB. Increased exercise, healthy diet and weight loss was stressed as the patient noted eating a lot of sweets (A1C 8%). He was continued on ASA/ACEi/sotalol/statin (on atorvastatin as he notes a rosuvastatin intolerance).  He reports left-upper intermittent, chest pain described as pressure beginning the morning lasting for 3-4 seconds occurring fairly frequently associated hot flushing and lightheadedness. The sensation is  localized to his left upper chest, but states his entire body soon becomes flushed. Last Thursday night, he awoke in the middle of the night with a similar sensation. No attempt to alleviate with NTG SL. He denies shortness of breath, palpitations, n/v, diaphoresis. No association with exertion. No LE edema, increased weight gain, orthopnea, PND or new cough. Denies fevers, chills or sick contacts. He recently traveled to [place] last week, and did not get out to rest. This is not reminiscent of prior heart attacks. He had eaten breakfast 10 minutes prior this morning, but describes this differently from indigestion. No aggravation with inspiration. He notes these episodes make him quite anxious. 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 He takes Actos+Met for type 2 DM managed by PCP. He is fairly sedentary, but states he could go on a long walk if he wanted to. No tobacco abuse, EtOH or illicit drug use. He admits to not taking Synthroid consistently. He also takes Effexor.   Upon arrival in the ED, EKG reveals no active ischemia and is unchanged from prior tracings (NSR, RBBB, LAD, QTc 447, low anterior lead voltage). POC trop-I WNL. No evidence of arrhythmia on telemetry review. CXR reveals no evidence of cardiopulmonary disease. CBC unremarkable. Glu 264, BMET otherwise unremarkable.   Problem List: Past Medical History  Diagnosis Date  . Coronary artery disease   . Ventricular tachycardia     Symptomatic ventricular tachycardia  . Old Inferior Myocardial Infarction   . Diabetes mellitus   . Dyslipidemia   . History of tobacco use   . Gout   . Parathyroid adenoma   . Thyroid disease     Past Surgical History  Procedure  Date  . Cardiac catheterization 11/2008    nonobstructive CAD, left main normal, LAD normal, minor LCx irregularities <10%, mRCA 30% ISR, dRCA 30% narrowing proximal to PDA and PLB bifurcation; stented segments widely patent, LVEF 55%; severe  inferior basal wall hypokinesis  . Other surgical history     Status post removal of parathyroid adenoma  . Cholecystectomy   . Cardiac catheterization 08/2004    Acute inferior MI 2/2 dRCA occlusion s/p 2.5 x 24 mm Taxus stent and a 2.75 x 20 mm Taxus stent at the crux; LVEF 45%, moderate akinesis of the inferior wall  . Cardiac catheterization 2000    pRCA 4.0 x 13 mm MultiLink stent, dRCA 3.0 x 8 mm MultiLink stent     Allergies:  Allergies  Allergen Reactions  . Crestor (Rosuvastatin Calcium)     ITCHING AND PALPITATIONS  . Penicillins     Home Medications: Prior to Admission medications   Medication Sig Start Date End Date Taking? Authorizing Provider  aspirin 325 MG tablet Take 325 mg by mouth daily.   Yes Historical Provider, MD  atorvastatin (LIPITOR) 80 MG tablet Take 1 tablet (80 mg total) by mouth daily. 09/10/11  Yes Dezarae Mcclaran M [place], MD  ramipril (ALTACE) 2.5 MG capsule Take 2.5 mg by mouth daily.   Yes Historical Provider, MD  sotalol (BETAPACE) 120 MG tablet Take 120 mg by mouth 2 (two) times daily.   Yes Historical Provider, MD    Inpatient Medications:     . aspirin  324 mg Oral Once    (Not in a hospital admission)  Family History  Problem Relation Age of Onset  . Heart attack Father      History   Social History  . Marital Status: Married    Spouse Name: N/A    Number of Children: 3  . 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 Smoking status: Former Smoker    Quit date: 10/04/1997  . Smokeless tobacco: Not on file  . Alcohol Use: No  . Drug Use: No  . Sexually Active: Not on file   Other Topics Concern  . Not on file   Social History Narrative   Lives in [place], [place] with wife.      Review of Systems: General: positive for hot flushing, negative for chills, fever, night sweats or weight changes.  Cardiovascular: positive for chest pain, negative for dyspnea on exertion, edema,  orthopnea, palpitations, paroxysmal nocturnal dyspnea or shortness of breath Dermatological: negative for rash Respiratory: negative for cough or wheezing Urologic: negative for hematuria Abdominal: negative for nausea, vomiting, diarrhea, bright red blood per rectum, melena, or hematemesis Neurologic: positive for lightheadedness, negative for visual changes, syncope All other systems reviewed and are otherwise negative except as noted above.  Physical Exam: Blood pressure 117/76, pulse 69, temperature 98.4 F (36.9 C), temperature source Oral, resp. rate 13, SpO2 100.00%.   General: Well developed, well nourished, in no acute distress. Head: Normocephalic, atraumatic, sclera non-icteric, no xanthomas, nares are without discharge.  Neck: Negative for carotid bruits. JVD not elevated. Lungs: Clear bilaterally to auscultation without wheezes, rales, or rhonchi. Breathing is unlabored. Heart: RRR with S1 S2. No murmurs, rubs, or gallops appreciated. Abdomen: Soft, non-tender, non-distended with normoactive bowel sounds. No hepatomegaly. No rebound/guarding. No obvious abdominal masses. Msk:  Strength and tone appears normal for age. Extremities: No clubbing, cyanosis or edema.  Distal pedal pulses are  2+ and equal bilaterally. Neuro: Alert and oriented X 3. Moves all extremities spontaneously. Psych:  Responds to questions appropriately with a normal affect.  Labs: Recent Labs  [facility] 10/05/11 0933   WBC 8.5   HGB 14.3   HCT 39.9   MCV 92.6   PLT 165    Lab 10/05/11 0933  NA 138  K 3.8  CL 101  CO2 27  BUN 12  CREATININE 0.81  CALCIUM 8.4  PROT --  BILITOT --  ALKPHOS --  ALT --  AST --  AMYLASE --  LIPASE --  GLUCOSE 264*   Radiology/Studies: Dg Chest 2 View  10/05/2011  *RADIOLOGY REPORT*  Clinical Data: Left upper chest pain, pressure.  CHEST - 2 VIEW  Comparison: 12/04/2008  Findings: Heart is upper limits normal in size.  Lungs are clear. No effusions.  No  acute bony abnormality.  IMPRESSION: No active cardiopulmonary disease.  Original Report Authenticated By: Cyndie Chime, M.D.    EKG: NSR, 76, RBBB, LAD, low voltage QRS anteriorly (consistent with prior tracings)  ASSESSMENT AND PLAN:   1. Unstable angina/CAD- patient reports intermittent episodes of chest pressure in his left upper chest without radiation, associated with hot flushing and lightheadedness, self-limited lasting for several seconds occuring since last week and becoming more frequent. As noted in the HPI, he denies further ischemic, arrhythmic or heart failure-type symptoms. No infectious or respiratory symptoms. Not reminiscent of prior MIs or associated with exertion. He does note a recent trip to [place], however there is no evidence of tachycardia, tachypnea, hypoxia, dyspnea or LE edema. Low-suspicion for PE. He had recently eaten breakfast prior to the episodes this morning, but denies association with meals last week and is different from indigestion. He does note significant baseline anxiety at work and has a daughter getting married soon. He states that these episodes further potentiate his anxiety. In the ED, EKG is without evidence of ischemia and POC trop-I is WNL. CXR, CBC and BMET unremarkable. Telemetry reveals no evidence of arrhythmia. After discussing with MD, given his significant cardiac history, RFs, HPI concerning for unstable angina and lack of ischemic eval since 2010, will plan for cardiac catheterization. Will load with Plavix and start on heparin. Cycle cardiac biomarkers. Continue cardiac medications including sotalol after MD discussed with EP (concern was use of sotalol given coronary disease and history of decreased LV function). Full-dose ASA will be reduced to daily low-dose ASA. Further recommendations to follow pending interventionalist's findings.  2. History of monomorphic VT- he notes lightheadedness, but denies palpitations during this time which is  characteristic of his VT/NSVT. No evidence of arrhythmia on telemetry in the ED. Will continue to monitor rhythm on telemetry on admission. Check Mg. EKG reveals QTc WNL. Continue sotalol.  3. Type 2 DM- states Hgb A1C has been somewhat high recently (8% in 06/2011 as noted by Dr. [place]). He takes Actos+Met consistently at home. Will switch to SSI while inpatient.   4. Hyperlipidemia- has been on high-dose atorvastatin since his MI in 2000. He has tolerated this well. Will review repeat lipid panel given poor diet and sedentary lifestyle.   5. Hypothyroidism- recently diagnosed per PCP. He admittedly has not been compliant with Synthroid at home, and may certainly be contributing to his lightheadedness and hot flushing. Will check TSH. He could not volunteer the exact dosage of Synthroid he takes at home. This will need to be clarified on pharmacy review of home medications so that this may be  continued inpatient.   6. Anxiety- may certainly be contributing to his symptoms, but given cardiac history will proceed with cardiac cath prior to considering this etiology. Of note, the patient is on Effexor SNRI outpatient (will need to be review per pharmacy as well for continuation on admission). I discussed methods to channel his anxiety into a consistent exercise regimen, the benefits of which would extend beyond anxiety management given his cardiac and metabolic history. He understood and agreed to implement this going forward.    Signed, R. Hurman Horn, PA-C 10/05/2011, 11:59 AM   Patient examined chart reviewed Pain a bit worrisome.  Load with Plavix and start heparin Discussed with Dr Ladona Ridgel ok to continue sotolol.  QT is ok.  No actute ECG changes Patient amenable to cath.  Charlton Haws 12:58 PM 10/05/2011

## 2011-10-05 NOTE — Progress Notes (Signed)
ANTICOAGULATION CONSULT NOTE - Initial Consult  Pharmacy Consult for Heparin Indication: chest pain/ACS  Allergies  Allergen Reactions  . Crestor (Rosuvastatin Calcium)     ITCHING AND PALPITATIONS  . Penicillins     Patient Measurements: Height: 5\' 10"  (177.8 cm) Weight: 208 lb 2 oz (94.405 kg) IBW/kg (Calculated) : 73  Heparin Dosing Weight: 92 kg  Vital Signs: Temp: 98.3 F (36.8 C) (07/16 1234) Temp src: Oral (07/16 1234) BP: 117/76 mmHg (07/16 1130) Pulse Rate: 69  (07/16 1234)  Labs:  [facility] 10/05/11 0933  HGB 14.3  HCT 39.9  PLT 165  APTT --  LABPROT --  INR --  HEPARINUNFRC --  CREATININE 0.81  CKTOTAL --  CKMB --  TROPONINI --    Estimated Creatinine Clearance: 121.7 ml/min (by C-G formula based on Cr of 0.81).   Medical History: Past Medical History  Diagnosis Date  . Coronary artery disease   . Ventricular tachycardia     Symptomatic ventricular tachycardia  . Old Inferior Myocardial Infarction   . Diabetes mellitus   . Dyslipidemia   . History of tobacco use   . Gout   . Parathyroid adenoma   . Thyroid disease      Assessment: 51 yo male with PMH significant for CAD (first MI at age 64), VT, DM, hypothyroidism (recently started on Synthroid) to start on heparin infusion for chest pain. CBC ok.   Goal of Therapy:  Heparin level 0.3-0.7 units/ml Monitor platelets by anticoagulation protocol: Yes   Plan:  Give 4000 units bolus x 1 Start heparin infusion at 1100 units/hr Heparin level in 6h at 1930 Daily heparin level and CBC starting tomorrow morning  Concha Norway 10/05/2011,12:56 PM

## 2011-10-05 NOTE — ED Provider Notes (Signed)
Medical screening examination/treatment/procedure(s) were conducted as a shared visit with non-physician practitioner(s) and myself.  I personally evaluated the patient during the encounter Hx of ami at 51 yo.  Had stent.  Had second ami with vague sxs. Now has left sided cp with flushed feeling.  Came to ed for eval. asx now.  ecg and labs neg.  Will discuss with cards for eval. Of poss [place].  Cheri Guppy, MD 10/05/11 1041

## 2011-10-06 ENCOUNTER — Encounter (HOSPITAL_COMMUNITY)
Admission: EM | Disposition: A | Discharge status: Home / Self Care | Payer: Self-pay | Source: Home / Self Care | Attending: Cardiovascular Disease

## 2011-10-06 ENCOUNTER — Encounter (HOSPITAL_COMMUNITY): Payer: Self-pay | Admitting: General Practice

## 2011-10-06 DIAGNOSIS — I2510 Atherosclerotic heart disease of native coronary artery without angina pectoris: Secondary | ICD-10-CM

## 2011-10-06 DIAGNOSIS — R079 Chest pain, unspecified: Secondary | ICD-10-CM

## 2011-10-06 HISTORY — PX: LEFT HEART CATHETERIZATION WITH CORONARY ANGIOGRAM: SHX5451

## 2011-10-06 HISTORY — PX: CARDIAC CATHETERIZATION: SHX172

## 2011-10-06 LAB — HEPARIN LEVEL (UNFRACTIONATED): Heparin Unfractionated: 0.54 IU/mL (ref 0.30–0.70)

## 2011-10-06 LAB — BASIC METABOLIC PANEL
Calcium: 8.2 mg/dL — ABNORMAL LOW (ref 8.4–10.5)
GFR calc non Af Amer: >90 mL/min (ref >90)
Glucose, Bld: 153 mg/dL — ABNORMAL HIGH (ref 70–99)
Sodium: 142 mEq/L (ref 135–145)

## 2011-10-06 LAB — CBC
MCH: 33.1 pg (ref 26.0–34.0)
MCHC: 36 g/dL (ref 30.0–36.0)
Platelets: 149 10*3/uL — ABNORMAL LOW (ref 150–400)
RBC: 4.11 MIL/uL — ABNORMAL LOW (ref 4.22–5.81)
RDW: 13.1 % (ref 11.5–15.5)

## 2011-10-06 LAB — CARDIAC PANEL(CRET KIN+CKTOT+MB+TROPI)
Relative Index: INVALID (ref 0.0–2.5)
Troponin I: <0.3 ng/mL (ref <0.30)

## 2011-10-06 LAB — GLUCOSE, CAPILLARY: Glucose-Capillary: 154 mg/dL — ABNORMAL HIGH (ref 70–99)

## 2011-10-06 SURGERY — LEFT HEART CATHETERIZATION WITH CORONARY ANGIOGRAM
Anesthesia: LOCAL

## 2011-10-06 MED ORDER — FENTANYL CITRATE 0.05 MG/ML IJ SOLN
INTRAMUSCULAR | Status: AC
  Filled 2011-10-06: qty 2

## 2011-10-06 MED ORDER — HEPARIN (PORCINE) IN NACL 2-0.9 UNIT/ML-% IJ SOLN
INTRAMUSCULAR | Status: AC
  Filled 2011-10-06: qty 2000

## 2011-10-06 MED ORDER — ASPIRIN 81 MG PO TBEC: 81.0000 mg | DELAYED_RELEASE_TABLET | Freq: Every day | ORAL | Status: AC

## 2011-10-06 MED ORDER — SODIUM CHLORIDE 0.9 % IV SOLN
INTRAVENOUS | Status: DC
  Administered 2011-10-06: 10:00:00 via INTRAVENOUS

## 2011-10-06 MED ORDER — LIDOCAINE HCL (PF) 1 % IJ SOLN
INTRAMUSCULAR | Status: AC
  Filled 2011-10-06: qty 30

## 2011-10-06 MED ORDER — NITROGLYCERIN 0.2 MG/ML ON CALL CATH LAB
INTRAVENOUS | Status: AC
  Filled 2011-10-06: qty 1

## 2011-10-06 MED ORDER — MIDAZOLAM HCL 2 MG/2ML IJ SOLN
INTRAMUSCULAR | Status: AC
  Filled 2011-10-06: qty 2

## 2011-10-06 MED ORDER — NITROGLYCERIN 0.4 MG SL SUBL: 0.4000 mg | SUBLINGUAL_TABLET | SUBLINGUAL | Status: DC | PRN

## 2011-10-06 MED ORDER — HEPARIN SODIUM (PORCINE) 1000 UNIT/ML IJ SOLN
INTRAMUSCULAR | Status: AC
  Filled 2011-10-06: qty 1

## 2011-10-06 MED ORDER — VERAPAMIL HCL 2.5 MG/ML IV SOLN
INTRAVENOUS | Status: AC
  Filled 2011-10-06: qty 2

## 2011-10-06 NOTE — Interval H&P Note (Signed)
History and Physical Interval Note:  10/06/2011 9:11 AM  [name]  has presented today for surgery, with the diagnosis of chest pain  The various methods of treatment have been discussed with the patient and family. After consideration of risks, benefits and other options for treatment, the patient has consented to  Procedure(s) (LRB): LEFT HEART CATHETERIZATION WITH CORONARY ANGIOGRAM (N/A) as a surgical intervention .  The patient's history has been reviewed, patient examined, no change in status, stable for surgery.  I have reviewed the patients' chart and labs.  Questions were answered to the patient's satisfaction.     Lorine Bears

## 2011-10-06 NOTE — Discharge Summary (Signed)
Discharge Summary   Patient ID: [name],  MRN: [id number], DOB/AGE: 1960-08-17 51 y.o.  Admit date: 2011/10/17 Discharge date: 10/06/2011  Primary Physician: Bosie Clos, MD Primary Cardiologist: Swaziland, Peter, MD  Discharge Diagnoses Principal Problem:  *Chest pain Active Problems:  CAD (coronary artery disease)  Hyperlipidemia  Ventricular tachycardia, monomorphic  Type 2 diabetes mellitus  Hypothyroidism  Anxiety   Allergies Allergies  Allergen Reactions  . Crestor (Rosuvastatin Calcium)     ITCHING AND PALPITATIONS  . Penicillins     Diagnostic Studies/Procedures  CHEST X-RAY- 17-Oct-2011  Comparison: 12/04/2008  Findings: Heart is upper limits normal in size. Lungs are clear.  No effusions. No acute bony abnormality.  IMPRESSION:  No active cardiopulmonary disease.  CARDIAC CATHETERIZATION- 10/06/11  Hemodynamics:  AO: 120/69 mmHg  LV: 120/6 mmHg  LVEDP: 17 mmHg  Coronary angiography:  Coronary dominance: Right  Left Main: Normal.  Left Anterior Descending (LAD): Normal in size with minor calcifications proximally. The vessel has minor luminal irregularities.  1st diagonal (D1): Normal in size and free of significant disease.  2nd diagonal (D2): Very small in size.  3rd diagonal (D3): Very small in size.  Circumflex (LCx): Medium in size and nondominant. The vessel has minor irregularities.  1st obtuse marginal: Large in size and free of significant disease. It's serves the ramus distribution.  2nd obtuse marginal: Small in size with minor irregularities.  3rd obtuse marginal: Medium size with minor irregularities.  Right Coronary Artery: Large in size and dominant. There is a 20% discrete stenosis proximally. The stent is noted in the midsegment with 40-50 % in-stent restenosis. A stent is noted distally which is patent with mild 20% restenosis proximally.  posterior descending artery: Normal in size with minor irregularities.  posterior  lateral branch: Large-size branch is with minor irregularities. Left ventriculography: Left ventricular systolic function is mildly reduced , LVEF is estimated at 45-50 %, there is no significant mitral regurgitation . Mild basal inferior wall hypokinesis.  Final Conclusions:  1. No evidence of obstructive coronary artery disease. Known history of one-vessel CAD with patent stents in the RCA with mild to moderate (40-50%) instent restenosis.  2. Mildly reduced LV systolic function with an ejection fraction of 45-50%.  History of Present Illness  [name] is a 51yo male with the above problem list who was admitted to [facility] on 10/17/2011 with chest pain concerning for unstable angina.   The morning prior to admission, he noted intermittent, self-limiting episodes of left-upper chest pain, hot flushing and lightheadedness. This episode had occurred several days prior, but had been less intensive and shorter in duration. Given these symptoms, he presented to [facility] ED. There, EKG revealed no evidence of ischemia. POC trop- I was WNL. CXR unremarkable. Given his cardiac history and symptoms concerning for unstable angina, he was admitted to telemetry. The plan was made to pursue cardiac catheterization the following day.   Hospital Course   The patient was continued on all outpatient medications. Heparin was dosed per pharmacy. There were no acute events overnight. Cardiac biomarkers returned WNL x 3 (trop-I x 4). EKG the following day revealed no evidence of ischemia.  The following morning, he was informed, consented and prepped for cardiac catheterization via the R wrist. The full details of the procedure are outlined above. Notably, findings consisted of normal left main, LAD, D1-3, LCx and OM1-OM3; 20% prox RCA stenosis, 40-50% ISR mid RCA, 20% ISR distal RCA; LVEF 45-50%; mild basal inferior wall hypokinesis.  He tolerated the procedure well without immediate complications. The  recommendation was made to continue medical therapy. Dr. Kirke Corin assessed the patient, and found him stable for discharge later that day. He was transferred back to telemetry in stable condition.   He will be discharged and continue outpatient medications. NTG SL PRN was added as his prior prescription was almost expired. Additionally, full-dose ASA was reduced to low-dose ASA daily to preserve antiplatelet effects and reduce bleeding risk. He will follow-up with Dr. Swaziland in ~ 4 weeks as noted below. Of note, the patient noted considerable anxiety with his job and acute stress recently as his daughter will be getting married soon. This may have contributed to his chest pain. Methods to manage his anxiety non-pharmacologically and pharmacologically were discussed. He will follow-up with his PCP for further management of this. A work note was provided as well. This information has been clearly outlined in the discharge AVS.   Discharge Vitals:  Blood pressure 129/76, pulse 66, temperature 98.5 F (36.9 C), temperature source Oral, resp. rate 18, height 5\' 10"  (1.778 m), weight 94.711 kg (208 lb 12.8 oz), SpO2 99.00%.   Labs: Recent Labs  Basename 10/06/11 0430 10/05/11 0933   WBC 8.6 8.5   HGB 13.6 14.3   HCT 37.8* 39.9   MCV 92.0 92.6   PLT 149* 165    Lab 10/06/11 0430 10/05/11 0933  NA 142 138  K 3.5 3.8  CL 104 101  CO2 26 27  BUN 10 12  CREATININE 0.70 0.81  CALCIUM 8.2* 8.4  PROT -- --  BILITOT -- --  ALKPHOS -- --  ALT -- --  AST -- --  AMYLASE -- --  LIPASE -- --  GLUCOSE 153* 264*   Recent Labs  Basename 10/06/11 0430 10/05/11 2305 10/05/11 1658   CKTOTAL 73 81 82   CKMB 1.2 1.3 1.4   CKMBINDEX -- -- --   TROPONINI <0.30 <0.30 <0.30   Recent Labs  Basename 10/05/11 0933   CHOL 108   HDL 37*   LDLCALC 41   TRIG 150*   CHOLHDL 2.9   LDLDIRECT --    Basename 10/05/11 1902  TSH 2.756  T4TOTAL --  T3FREE --  THYROIDAB --    Disposition:  Discharge Orders     Future Appointments: Provider: Department: Dept Phone: Center:   11/16/2011 3:15 PM [name] Swaziland, MD [facility] [phone] None     Follow-up Information    Follow up with Peter Swaziland, MD on 11/16/2011. (At 3:15 PM for follow-up after this hospitalization. )    Contact information:   [address], [address] [place] [address] [phone]          Discharge Medications:  Medication List  As of 10/06/2011  3:28 PM   START taking these medications         aspirin 81 MG EC tablet   Take 1 tablet (81 mg total) by mouth daily.      nitroGLYCERIN 0.4 MG SL tablet   Commonly known as: NITROSTAT   Place 1 tablet (0.4 mg total) under the tongue every 5 (five) minutes x 3 doses as needed for chest pain.         CONTINUE taking these medications         atorvastatin 80 MG tablet   Commonly known as: LIPITOR   Take 1 tablet (80 mg total) by mouth daily.      levothyroxine 50 MCG tablet  Commonly known as: SYNTHROID, LEVOTHROID      niacin 1000 MG CR tablet   Commonly known as: NIASPAN      pioglitazone-metformin 15-850 MG per tablet   Commonly known as: ACTOPLUS MET      ramipril 2.5 MG capsule   Commonly known as: ALTACE      sotalol 120 MG tablet   Commonly known as: BETAPACE      venlafaxine XR 150 MG 24 hr capsule   Commonly known as: EFFEXOR-XR          Where to get your medications    These are the prescriptions that you need to pick up. We sent them to a specific pharmacy, so you will need to go there to get them.   [id number] [place], [place] - [address]    [address] [place] [address]    Phone: [phone]        nitroGLYCERIN 0.4 MG SL tablet         Information on where to get these meds is not yet available. Ask your nurse or doctor.         aspirin 81 MG EC tablet           Outstanding Labs/Studies: None  Duration of Discharge Encounter: Greater than 30 minutes including physician  time.  Signed, R. Hurman Horn, PA-C 10/06/2011, 3:28 PM

## 2011-10-06 NOTE — Progress Notes (Signed)
ANTICOAGULATION CONSULT NOTE - Follow Up Consult  Pharmacy Consult for heparin Indication: chest pain/ACS  Allergies  Allergen Reactions  . Crestor (Rosuvastatin Calcium)     ITCHING AND PALPITATIONS  . Penicillins     Patient Measurements: Height: 5\' 10"  (177.8 cm) Weight: 208 lb 12.8 oz (94.711 kg) IBW/kg (Calculated) : 73  Heparin Dosing Weight: 92 kg  Vital Signs: Temp: 98.5 F (36.9 C) (07/17 0500) Temp src: Oral (07/17 0500) BP: 131/85 mmHg (07/17 0500) Pulse Rate: 63  (07/17 0500)  Labs:  Basename 10/06/11 0430 10/05/11 2305 10/05/11 1902 10/05/11 1658 10/05/11 0933  HGB 13.6 -- -- -- 14.3  HCT 37.8* -- -- -- 39.9  PLT 149* -- -- -- 165  APTT -- -- -- -- --  LABPROT -- -- 14.4 -- --  INR -- -- 1.10 -- --  HEPARINUNFRC 0.54 -- 0.56 -- --  CREATININE 0.70 -- -- -- 0.81  CKTOTAL 73 81 -- 82 --  CKMB 1.2 1.3 -- 1.4 --  TROPONINI <0.30 <0.30 -- <0.30 --    Estimated Creatinine Clearance: 123.4 ml/min (by C-G formula based on Cr of 0.7).   Medications:  Scheduled:    . aspirin  324 mg Oral Once  . aspirin EC  81 mg Oral Daily  . atorvastatin  80 mg Oral Daily  . clopidogrel  600 mg Oral Once  . diazepam  5 mg Oral On Call  . heparin      . heparin      . heparin  4,000 Units Intravenous Once  . insulin aspart  0-15 Units Subcutaneous TID WC  . lidocaine      . nitroGLYCERIN      . ramipril  2.5 mg Oral Daily  . sodium chloride  3 mL Intravenous Q12H  . sodium chloride  3 mL Intravenous Q12H  . sotalol  120 mg Oral BID  . verapamil      . DISCONTD: diazepam  5 mg Oral On Call   Infusions:    . sodium chloride 75 mL/hr at 10/06/11 0424  . heparin 1,100 Units/hr (10/05/11 1458)    Assessment: 51 yo male with PMH significant for CAD and VT admitted 10/05/11 with c/o CP. Cardiac enzymes neg x 3.    HL in therapeutic range this am at 0.54 on 1100 units/hr. H/H 13.6/37.8, slightly thrombocytopenic with plts 149 < 165. No bleeding noted per chart. Pt  to go to cath today.   Goal of Therapy:  Heparin level 0.3-0.7 units/ml Monitor platelets by anticoagulation protocol: Yes   Plan:  Continue IV heparin drip 1100 units/hr.  Daily heparin level and CBC    Tomi Bamberger, PharmD Clinical Pharmacist Pager: [phone] Pharmacy: [phone] 10/06/2011 9:08 AM

## 2011-10-06 NOTE — CV Procedure (Signed)
   Cardiac Catheterization Procedure Note  Name: [name] MRN: [id number] DOB: [birth year]  Procedure: Left Heart Cath, Selective Coronary Angiography, LV angiography  Indication: Unstable angina with known history of coronary artery disease.  Medications:  Sedation:  1 mg IV Versed, 100 mcg IV Fentanyl     Procedural Details: The right wrist was prepped, draped, and anesthetized with 1% lidocaine. Using the modified Seldinger technique, a 5 French sheath was introduced into the right radial artery. 3 mg of verapamil was administered through the sheath, weight-based unfractionated heparin was administered intravenously. A TIG 4 catheter was used for selective coronary angiography. A JR 4 catheter was used to engage the right coronary artery A pigtail catheter was used for left ventriculography. Catheter exchanges were performed over an exchange length guidewire. There were no immediate procedural complications. A TR band was used for radial hemostasis at the completion of the procedure.  The patient was transferred to the post catheterization recovery area for further monitoring.  Procedural Findings:  Hemodynamics: AO:  120/69   mmHg LV:  120/6    mmHg LVEDP: 17  mmHg  Coronary angiography: Coronary dominance: Right   Left Main:  Normal.  Left Anterior Descending (LAD):  Normal in size with minor calcifications proximally. The vessel has minor luminal irregularities.  1st diagonal (D1):  Normal in size and free of significant disease.  2nd diagonal (D2):  Very small in size.  3rd diagonal (D3):  Very small in size.  Circumflex (LCx):  Medium in size and nondominant. The vessel has minor irregularities.  1st obtuse marginal:  Large in size and free of significant disease. It's serves the ramus distribution.  2nd obtuse marginal:  Small in size with minor irregularities.  3rd obtuse marginal:  Medium size with minor irregularities.   Right Coronary Artery: Large in size  and dominant. There is a 20% discrete stenosis proximally. The stent is noted in the midsegment with 40-50 % in-stent restenosis. A stent is noted distally which is patent with mild 20% restenosis proximally.  posterior descending artery: Normal in size with minor irregularities.  posterior lateral branch:  Large-size branch is with minor irregularities.  Left ventriculography: Left ventricular systolic function is mildly reduced , LVEF is estimated at 45-50 %, there is no significant mitral regurgitation . Mild basal inferior wall hypokinesis.  Final Conclusions:   1. No evidence of obstructive coronary artery disease. Known history of one-vessel CAD with patent stents in the RCA with mild to moderate (40-50%) instent restenosis. 2. Mildly reduced LV systolic function with an ejection fraction of 45-50%.  Recommendations:  Continue medical therapy for coronary artery disease and mild cardiomyopathy. The patient can likely be discharged home today if no other issues.  Lorine Bears MD, [facility] 10/06/2011, 9:55 AM

## 2011-10-07 LAB — GLUCOSE, CAPILLARY: Glucose-Capillary: 202 mg/dL — ABNORMAL HIGH (ref 70–99)

## 2011-11-16 ENCOUNTER — Encounter: Payer: 59 | Admitting: Cardiology

## 2011-12-17 ENCOUNTER — Other Ambulatory Visit: Payer: Self-pay | Admitting: Cardiology

## 2012-02-09 ENCOUNTER — Encounter: Payer: Self-pay | Admitting: Cardiology

## 2012-02-15 ENCOUNTER — Other Ambulatory Visit: Payer: Self-pay

## 2012-02-15 MED ORDER — RAMIPRIL 2.5 MG PO CAPS: 2.5000 mg | ORAL_CAPSULE | Freq: Every day | ORAL | Status: DC

## 2012-05-15 ENCOUNTER — Encounter: Payer: Self-pay | Admitting: Cardiology

## 2012-05-17 ENCOUNTER — Other Ambulatory Visit: Payer: Self-pay | Admitting: Cardiology

## 2012-05-17 NOTE — Telephone Encounter (Signed)
..   Requested Prescriptions   Pending Prescriptions Disp Refills  . sotalol (BETAPACE) 120 MG tablet [Pharmacy Med Name: SOTALOL HCL TABS 120MG ] 180 tablet 1    Sig: TAKE 1 TABLET TWICE A DAY

## 2012-09-20 ENCOUNTER — Encounter: Payer: Self-pay | Admitting: Cardiology

## 2012-09-20 ENCOUNTER — Ambulatory Visit (INDEPENDENT_AMBULATORY_CARE_PROVIDER_SITE_OTHER): Payer: BC Managed Care – PPO | Admitting: Cardiology

## 2012-09-20 VITALS — BP 126/82 | HR 65 | Ht 70.0 in | Wt 203.8 lb

## 2012-09-20 DIAGNOSIS — I472 Ventricular tachycardia: Secondary | ICD-10-CM

## 2012-09-20 DIAGNOSIS — I4729 Other ventricular tachycardia: Secondary | ICD-10-CM

## 2012-09-20 DIAGNOSIS — F419 Anxiety disorder, unspecified: Secondary | ICD-10-CM

## 2012-09-20 DIAGNOSIS — E119 Type 2 diabetes mellitus without complications: Secondary | ICD-10-CM

## 2012-09-20 DIAGNOSIS — I252 Old myocardial infarction: Secondary | ICD-10-CM

## 2012-09-20 DIAGNOSIS — I2510 Atherosclerotic heart disease of native coronary artery without angina pectoris: Secondary | ICD-10-CM

## 2012-09-20 DIAGNOSIS — F411 Generalized anxiety disorder: Secondary | ICD-10-CM

## 2012-09-20 NOTE — Progress Notes (Signed)
[name] Date of Birth: Mar 04, 1961 Medical Record #[id number]  History of Present Illness: [name] is seen for followup. He was last seen one year ago. He has a significant history of coronary disease. He had his first myocardial infarction at age 52. At that time he had stenting of the proximal right coronary with a 4.0 x 13 mm MultiLink stent and the distal vessel with a 3.0 x 8 mm MultiLink stent. In June of 2010 he again had an acute inferior myocardial infarction related to occlusion of the distal right coronary. The distal vessel was stented with a 2.5 x 24 mm Taxus stent and a 2.75 x 20 mm Taxus stent at the crux. He had followup cardiac catheterization in September of 2010 which demonstrated nonobstructive disease and ejection fraction of 55%. His last evaluation with cardiac catheterization in July of 2013 showed nonobstructive disease as well. He also has a history of symptomatic monomorphic ventricular tachycardia that has been treated with Betapace therapy. On followup today he states he is doing okay from a cardiac standpoint. Several weeks ago he is experiencing some brief episodes of palpitations and feeling that his heart rate would increase to about 110 beats per minute. This usually occurred in the afternoon. He has been drinking a moderate amount of caffeine. He denies any significant chest pain or shortness of breath. He has been dealing with significant anxiety and depression symptoms. He is very sedentary and does no exercise. He is very stressed by his work environment. He has been started on thyroid replacement about 8 months ago.   Current Outpatient Prescriptions on File Prior to Visit  Medication Sig Dispense Refill  . aspirin EC 81 MG EC tablet Take 1 tablet (81 mg total) by mouth daily.      Marland Kitchen atorvastatin (LIPITOR) 80 MG tablet Take 1 tablet (80 mg total) by mouth daily.  90 tablet  3  . levothyroxine (SYNTHROID, LEVOTHROID) 50 MCG tablet Take 50 mcg by mouth daily.       . niacin (NIASPAN) 1000 MG CR tablet Take 1,000 mg by mouth at bedtime. Takes niaspan ER at bedtime with aspirin.      . nitroGLYCERIN (NITROSTAT) 0.4 MG SL tablet Place 1 tablet (0.4 mg total) under the tongue every 5 (five) minutes x 3 doses as needed for chest pain.  25 tablet  3  . pioglitazone-metformin (ACTOPLUS MET) 15-850 MG per tablet Take 1 tablet by mouth 2 (two) times daily with a meal.      . ramipril (ALTACE) 2.5 MG capsule Take 1 capsule (2.5 mg total) by mouth daily.  90 capsule  2  . sotalol (BETAPACE) 120 MG tablet TAKE 1 TABLET TWICE A DAY  180 tablet  1  . venlafaxine XR (EFFEXOR-XR) 150 MG 24 hr capsule Take 150 mg by mouth daily.       No current facility-administered medications on file prior to visit.    Allergies  Allergen Reactions  . Crestor (Rosuvastatin Calcium)     ITCHING AND PALPITATIONS  . Penicillins     Past Medical History  Diagnosis Date  . Coronary artery disease   . Ventricular tachycardia     Symptomatic ventricular tachycardia  . Old inferior myocardial infarction   . Diabetes mellitus   . Dyslipidemia   . History of tobacco use   . Gout   . Parathyroid adenoma   . Thyroid disease   . Hypertension   . Hypothyroidism   . Sleep apnea  does not use cpap  . Arthritis     Past Surgical History  Procedure Laterality Date  . Cardiac catheterization  11/2008    nonobstructive CAD, left main normal, LAD normal, minor LCx irregularities <10%, mRCA 30% ISR, dRCA 30% narrowing proximal to PDA and PLB bifurcation; stented segments widely patent, LVEF 55%; severe inferior basal wall hypokinesis  . Other surgical history      Status post removal of parathyroid adenoma  . Cholecystectomy    . Cardiac catheterization  08/2004    Acute inferior MI 2/2 dRCA occlusion s/p 2.5 x 24 mm Taxus stent and a 2.75 x 20 mm Taxus stent at the crux; LVEF 45%, moderate akinesis of the inferior wall  . Cardiac catheterization  2000    pRCA 4.0 x 13 mm  MultiLink stent, dRCA 3.0 x 8 mm MultiLink stent  . Parathroid    . Cardiac catheterization  10/06/11    normal left main, LAD, D1-3, LCx and OM1-OM3; 20% prox RCA stenosis, 40-50% ISR mid RCA, 20% ISR distal RCA; LVEF 45-50%; mild basal inferior wall hypokinesis.    History  Smoking status  . Former Smoker  . Quit date: 10/04/1997  Smokeless tobacco  . Current User  . Types: Snuff    History  Alcohol Use No    Family History  Problem Relation Age of Onset  . Heart attack Father     Review of Systems: As noted in history of present illness.  All other systems were reviewed and are negative.  Physical Exam: BP 126/82  Pulse 65  Ht 5\' 10"  (1.778 m)  Wt 203 lb 12.8 oz (92.443 kg)  BMI 29.24 kg/m2 He is an obese white male in no acute distress.The patient is alert and oriented x 3.  The mood and affect are depressed.  The skin is warm and dry.  Color is normal.  The HEENT exam reveals that the sclera are nonicteric.  The mucous membranes are moist.  The carotids are 2+ without bruits.  There is no thyromegaly.  There is no JVD.  The lungs are clear.   The heart exam reveals a regular rate with a normal S1 and S2.  There are no murmurs, gallops, or rubs.  The PMI is not displaced.   Abdominal exam reveals good bowel sounds.  There is no guarding or rebound.  There is no hepatosplenomegaly or tenderness.  There are no masses.  Exam of the legs reveal no clubbing, cyanosis, or edema.  The legs are without rashes.  The distal pulses are intact.  Cranial nerves II - XII are intact.  Motor and sensory functions are intact.  The gait is normal.  LABORATORY DATA: ECG today demonstrates normal sinus rhythm with left axis deviation and evidence of old inferior myocardial infarction.  His QTC is normal at 440 ms.  Assessment / Plan: 1. Coronary disease with multiple interventions as noted in history of present illness. Patient is having no significant anginal symptoms. Continue aspirin  therapy.  2. Ventricular tachycardia. This is well controlled with sotalol therapy. QTC is normal. He has had some intermittent mild palpitations but nothing sustained. I have recommended avoidance of caffeine.  3. Dyslipidemia. Continue Lipitor and Niaspan.  4. Diabetes mellitus type 2.  5. Hypothyroidism.  6. Anxiety/depression.  I strongly encouraged Trey Paula to get regular aerobic exercise. I think this will help with weight control, conditioning, and his mental status. I'll followup again in 6 months.

## 2012-09-20 NOTE — Patient Instructions (Signed)
You need to increase your aerobic exercise. Try and work your way up to 30-45 minutes daily.  Avoid caffeine  Continue your other medication.  I will see you in 6 months.

## 2012-10-05 IMAGING — CR DG CHEST 2V
2 series · 2 of 2 positions shown · non-contrast
Comparison: 12/04/2008

CLINICAL DATA: Left upper chest pain, pressure.

CHEST - 2 VIEW

[w chest pa]
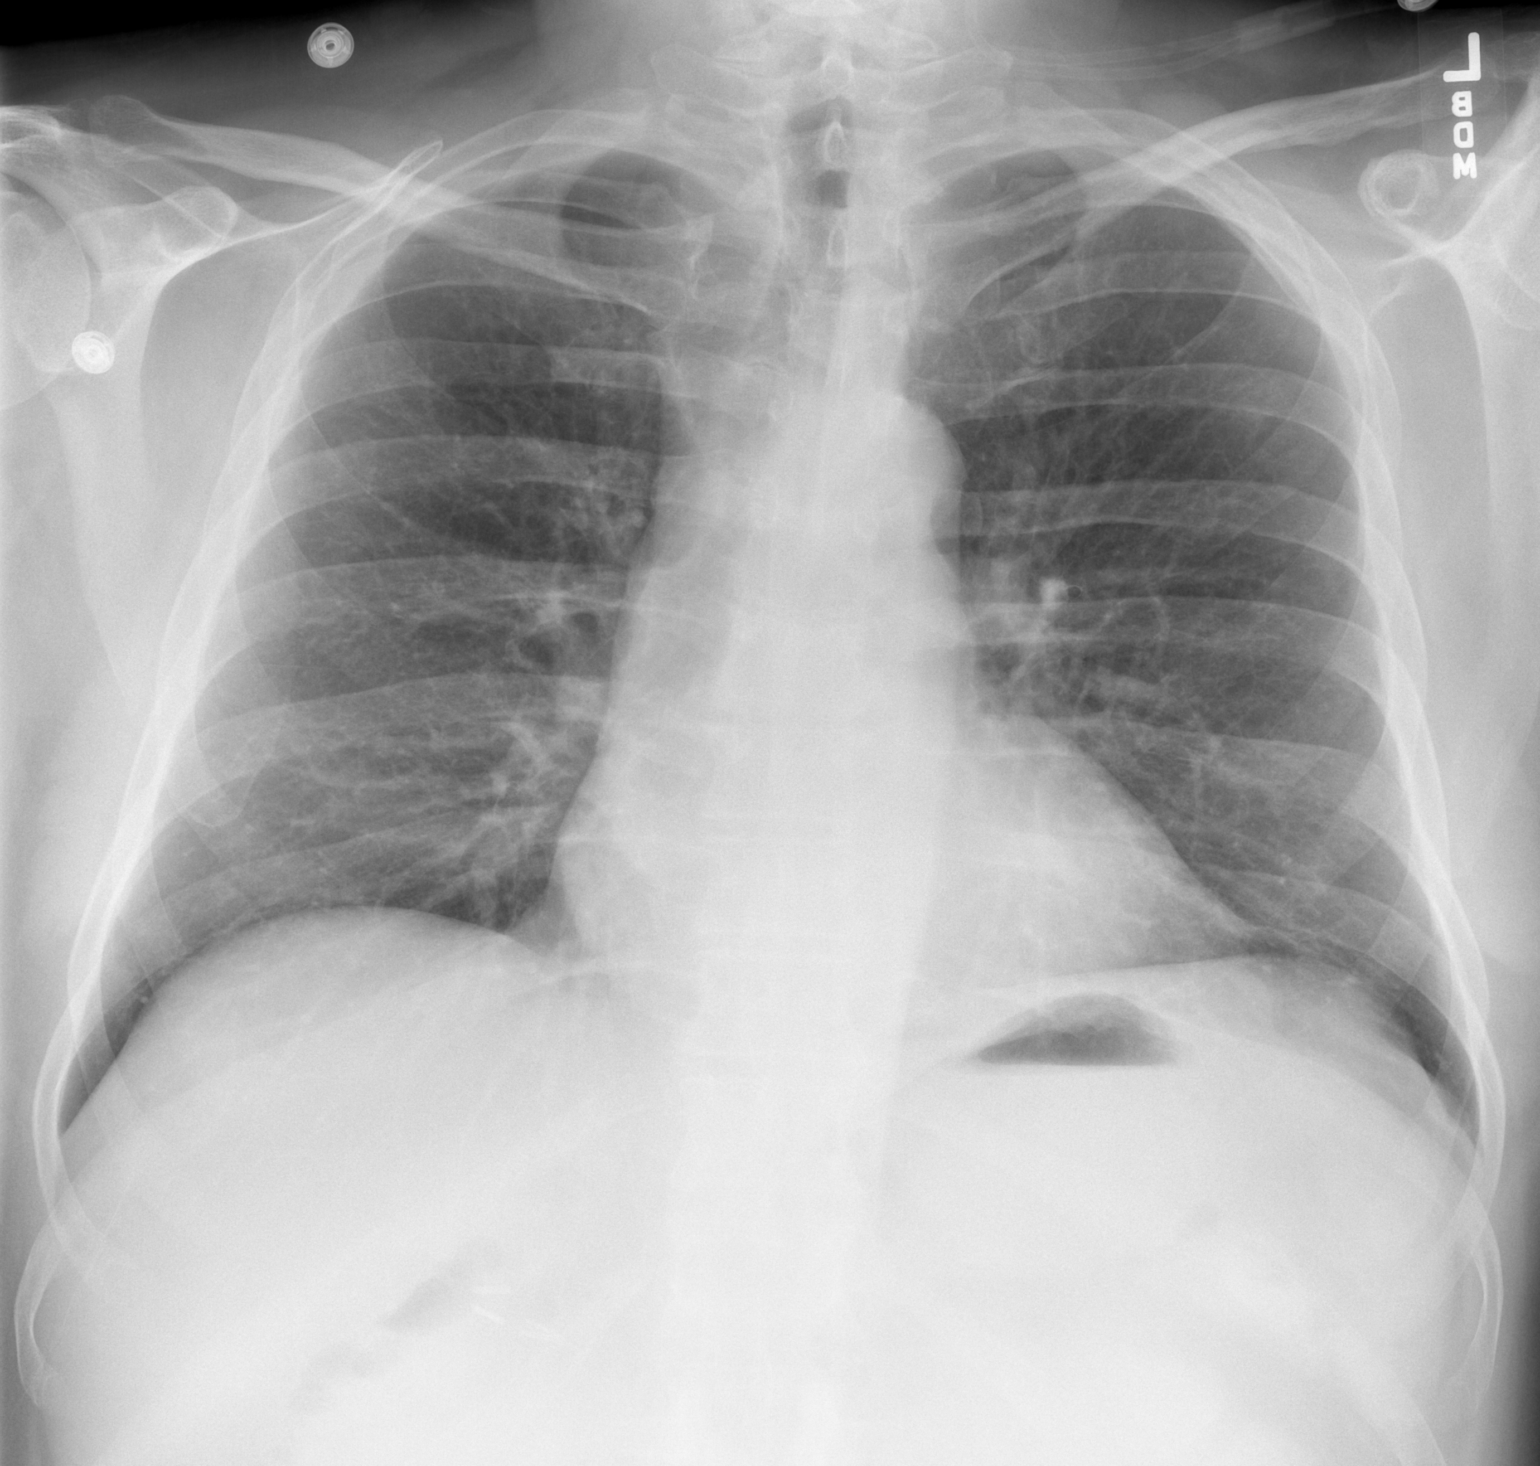

[w chest lat]
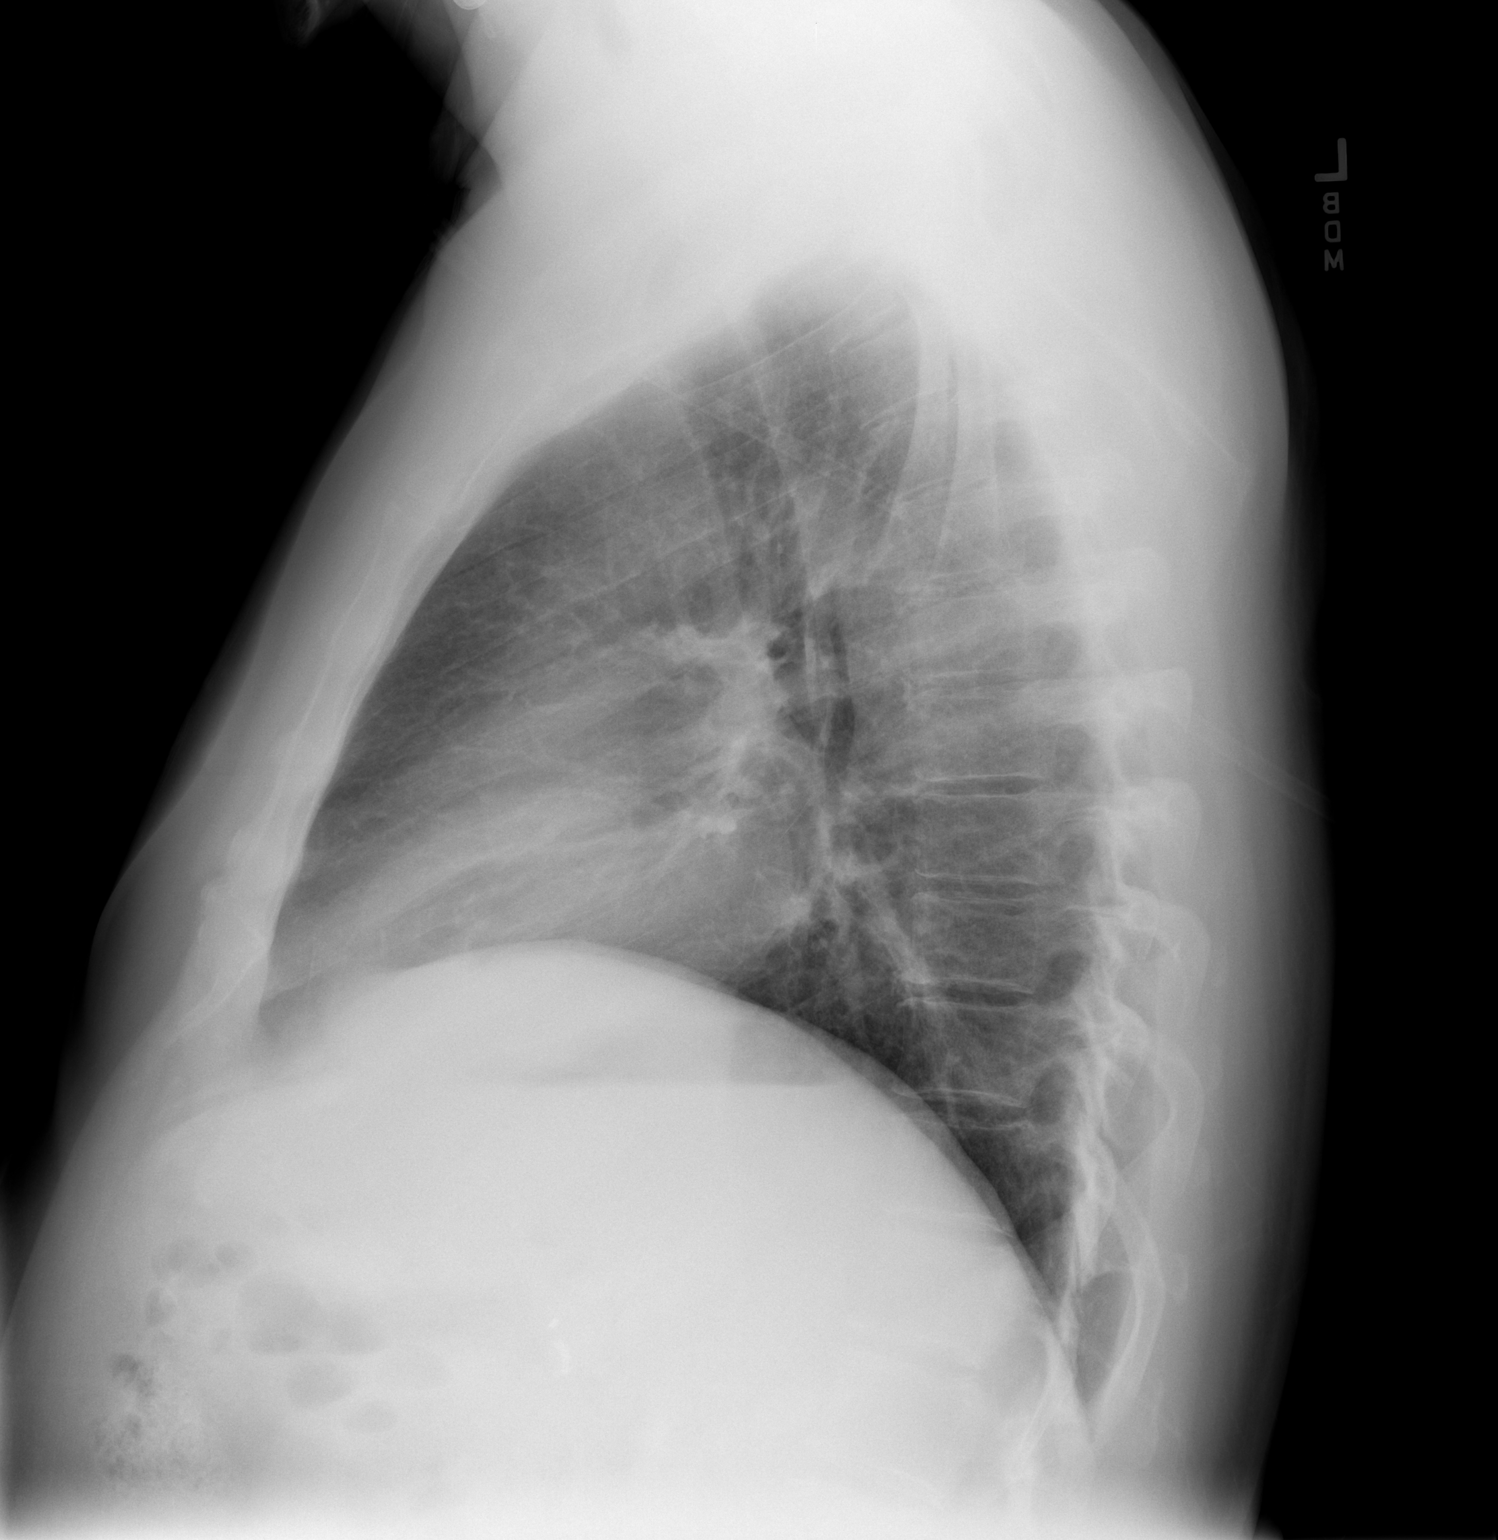

[2 of 2 positions shown; findings below may reference images not displayed]

FINDINGS: Heart is upper limits normal in size.  Lungs are clear.
No effusions.  No acute bony abnormality.
IMPRESSION: No active cardiopulmonary disease.

## 2012-10-07 ENCOUNTER — Other Ambulatory Visit: Payer: Self-pay | Admitting: Cardiology

## 2012-10-09 NOTE — Telephone Encounter (Signed)
atorvastatin (LIPITOR) 80 MG tablet  Take 1 tablet (80 mg total) by mouth daily.   90 tablet   3   Dyslipidemia. Continue Lipitor and Niaspan. [name] Swazil[place], MD at 09/20/2012 12:59 PM

## 2013-01-09 ENCOUNTER — Other Ambulatory Visit: Payer: Self-pay | Admitting: *Deleted

## 2013-01-09 MED ORDER — RAMIPRIL 2.5 MG PO CAPS: 2.5000 mg | ORAL_CAPSULE | Freq: Every day | ORAL | Status: DC

## 2013-01-09 MED ORDER — SOTALOL HCL 120 MG PO TABS: ORAL_TABLET | ORAL | Status: DC

## 2013-04-02 ENCOUNTER — Other Ambulatory Visit: Payer: Self-pay

## 2013-04-02 MED ORDER — NITROGLYCERIN 0.4 MG SL SUBL: 0.4000 mg | SUBLINGUAL_TABLET | SUBLINGUAL | Status: DC | PRN

## 2013-04-05 ENCOUNTER — Encounter: Payer: Self-pay | Admitting: Cardiology

## 2013-04-20 ENCOUNTER — Ambulatory Visit: Payer: BC Managed Care – PPO | Admitting: Cardiology

## 2013-04-30 ENCOUNTER — Ambulatory Visit (INDEPENDENT_AMBULATORY_CARE_PROVIDER_SITE_OTHER): Payer: BC Managed Care – PPO | Admitting: Cardiology

## 2013-04-30 ENCOUNTER — Encounter: Payer: Self-pay | Admitting: Cardiology

## 2013-04-30 ENCOUNTER — Encounter (INDEPENDENT_AMBULATORY_CARE_PROVIDER_SITE_OTHER): Payer: Self-pay

## 2013-04-30 VITALS — BP 122/78 | HR 67 | Ht 70.0 in | Wt 196.4 lb

## 2013-04-30 DIAGNOSIS — I4729 Other ventricular tachycardia: Secondary | ICD-10-CM

## 2013-04-30 DIAGNOSIS — I2510 Atherosclerotic heart disease of native coronary artery without angina pectoris: Secondary | ICD-10-CM

## 2013-04-30 DIAGNOSIS — I252 Old myocardial infarction: Secondary | ICD-10-CM

## 2013-04-30 DIAGNOSIS — I472 Ventricular tachycardia: Secondary | ICD-10-CM

## 2013-04-30 DIAGNOSIS — E785 Hyperlipidemia, unspecified: Secondary | ICD-10-CM

## 2013-04-30 NOTE — Progress Notes (Signed)
[name] Date of Birth: October 31, 1960 Medical Record #[id number]  History of Present Illness: [name] is seen for followup. He was last seen one year ago. He has a significant history of coronary disease. He had his first myocardial infarction at age 53. At that time he had stenting of the proximal right coronary with a 4.0 x 13 mm MultiLink stent and the distal vessel with a 3.0 x 8 mm MultiLink stent. In June of 2010 he again had an acute inferior myocardial infarction related to occlusion of the distal right coronary. The distal vessel was stented with a 2.5 x 24 mm Taxus stent and a 2.75 x 20 mm Taxus stent at the crux. He had followup cardiac catheterization in September of 2010 which demonstrated nonobstructive disease and ejection fraction of 55%. His last evaluation with cardiac catheterization in July of 2013 showed nonobstructive disease as well. He also has a history of symptomatic monomorphic ventricular tachycardia that has been treated with Betapace therapy. On followup today he states he is doing well from a cardiac standpoint. Denies palpitations, chest pain, SOB, or dizziness. He is still sedentary- the only exercise he gets is at work.   Current Outpatient Prescriptions on File Prior to Visit  Medication Sig Dispense Refill  . atorvastatin (LIPITOR) 80 MG tablet TAKE 1 TABLET DAILY  90 tablet  3  . levothyroxine (SYNTHROID, LEVOTHROID) 50 MCG tablet Take 50 mcg by mouth daily.      . nitroGLYCERIN (NITROSTAT) 0.4 MG SL tablet Place 1 tablet (0.4 mg total) under the tongue every 5 (five) minutes x 3 doses as needed for chest pain.  25 tablet  3  . pioglitazone-metformin (ACTOPLUS MET) 15-850 MG per tablet Take 1 tablet by mouth 2 (two) times daily with a meal.      . ramipril (ALTACE) 2.5 MG capsule Take 1 capsule (2.5 mg total) by mouth daily.  90 capsule  0  . sotalol (BETAPACE) 120 MG tablet TAKE 1 TABLET TWICE A DAY  180 tablet  0  . temazepam (RESTORIL) 30 MG capsule          No current facility-administered medications on file prior to visit.    Allergies  Allergen Reactions  . Crestor [Rosuvastatin Calcium]     ITCHING AND PALPITATIONS  . Penicillins     Past Medical History  Diagnosis Date  . Coronary artery disease   . Ventricular tachycardia     Symptomatic ventricular tachycardia  . Old inferior myocardial infarction   . Diabetes mellitus   . Dyslipidemia   . History of tobacco use   . Gout   . Parathyroid adenoma   . Thyroid disease   . Hypertension   . Hypothyroidism   . Sleep apnea     does not use cpap  . Arthritis     Past Surgical History  Procedure Laterality Date  . Cardiac catheterization  11/2008    nonobstructive CAD, left main normal, LAD normal, minor LCx irregularities <10%, mRCA 30% ISR, dRCA 30% narrowing proximal to PDA and PLB bifurcation; stented segments widely patent, LVEF 55%; severe inferior basal wall hypokinesis  . Other surgical history      Status post removal of parathyroid adenoma  . Cholecystectomy    . Cardiac catheterization  08/2004    Acute inferior MI 2/2 dRCA occlusion s/p 2.5 x 24 mm Taxus stent and a 2.75 x 20 mm Taxus stent at the crux; LVEF 45%, moderate akinesis of the inferior wall  .  Cardiac catheterization  2000    pRCA 4.0 x 13 mm MultiLink stent, dRCA 3.0 x 8 mm MultiLink stent  . Parathroid    . Cardiac catheterization  10/06/11    normal left main, LAD, D1-3, LCx and OM1-OM3; 20% prox RCA stenosis, 40-50% ISR mid RCA, 20% ISR distal RCA; LVEF 45-50%; mild basal inferior wall hypokinesis.    History  Smoking status  . Former Smoker  . Quit date: 10/04/1997  Smokeless tobacco  . Current User  . Types: Snuff    History  Alcohol Use No    Family History  Problem Relation Age of Onset  . Heart attack Father     Review of Systems: As noted in history of present illness.  All other systems were reviewed and are negative.  Physical Exam: BP 122/78  Pulse 67  Ht _0   (1.778 m)  Wt 196 lb 6.4 oz (89.086 kg)  BMI 28.18 kg/m2  SpO2 99% He is a WD white male in no acute distress.  The HEENT exam is normal. The carotids are 2+ without bruits.  There is no thyromegaly.  There is no JVD.  The lungs are clear.   The heart exam reveals a regular rate with a normal S1 and S2.  There are no murmurs, gallops, or rubs.  The PMI is not displaced.   Abdominal exam reveals good bowel sounds.    There is no hepatosplenomegaly or tenderness.  There are no masses.  Exam of the legs reveal no clubbing, cyanosis, or edema.  The legs are without rashes.  The distal pulses are intact.  Cranial nerves II - XII are intact.  Motor and sensory functions are intact.  The gait is normal.  LABORATORY DATA:   Assessment / Plan: 1. Coronary disease with multiple interventions as noted in history of present illness. Last cath in 2013 showed nonobstructive disease. Patient is having no significant anginal symptoms. Continue aspirin therapy.  2. Ventricular tachycardia. This is well controlled with sotalol therapy. QTC is normal in July 2014. He is asymptomatic  3. Dyslipidemia. Continue Lipitor high dose. Based on results of AIM-HIGH study and others I have recommended stopping Niaspan.  4. Diabetes mellitus type 2.  5. Hypothyroidism.  6. Anxiety/depression.  I strongly encouraged Merry Proud to get regular aerobic exercise. I'll followup again in 6 months.

## 2013-04-30 NOTE — Patient Instructions (Signed)
Stop taking Niaspan  Continue your other therapy  I will see you in 6 months

## 2013-05-02 ENCOUNTER — Other Ambulatory Visit: Payer: Self-pay | Admitting: Cardiology

## 2013-08-03 ENCOUNTER — Other Ambulatory Visit: Payer: Self-pay | Admitting: Cardiology

## 2013-08-08 ENCOUNTER — Other Ambulatory Visit: Payer: Self-pay | Admitting: *Deleted

## 2013-08-08 MED ORDER — SOTALOL HCL 120 MG PO TABS: ORAL_TABLET | ORAL | Status: DC

## 2013-09-19 LAB — HEMOGLOBIN A1C: Hgb A1c MFr Bld: 7.7 % — AB (ref 4.0–6.0)

## 2013-10-11 LAB — LIPID PANEL
CHOLESTEROL: 104 mg/dL (ref 0–200)
HDL: 34 mg/dL — AB (ref 35–70)
LDL CALC: 52 mg/dL
LDL/HDL RATIO: 1.5
Triglycerides: 92 mg/dL (ref 40–160)

## 2013-10-11 LAB — HEPATIC FUNCTION PANEL
ALT: 84 U/L — AB (ref 10–40)
AST: 16 U/L (ref 14–40)
Alkaline Phosphatase: 84 U/L (ref 25–125)
BILIRUBIN, TOTAL: 0.4 mg/dL

## 2013-10-11 LAB — TSH: TSH: 4.85 u[IU]/mL (ref <=5.90)

## 2013-10-22 LAB — BASIC METABOLIC PANEL
BUN: 21 mg/dL (ref 4–21)
CREATININE: 0.9 mg/dL (ref 0.6–1.3)
GLUCOSE: 193 mg/dL
Potassium: 4.8 mmol/L (ref 3.4–5.3)
Sodium: 142 mmol/L (ref 137–147)

## 2013-11-01 ENCOUNTER — Other Ambulatory Visit: Payer: Self-pay | Admitting: Cardiology

## 2013-11-05 ENCOUNTER — Other Ambulatory Visit: Payer: Self-pay | Admitting: Cardiology

## 2014-01-30 ENCOUNTER — Other Ambulatory Visit: Payer: Self-pay | Admitting: Cardiology

## 2014-02-06 ENCOUNTER — Other Ambulatory Visit: Payer: Self-pay | Admitting: Cardiology

## 2014-02-28 ENCOUNTER — Encounter (HOSPITAL_COMMUNITY): Payer: Self-pay | Admitting: Cardiovascular Disease

## 2014-03-31 ENCOUNTER — Other Ambulatory Visit: Payer: Self-pay | Admitting: Cardiology

## 2014-05-14 ENCOUNTER — Other Ambulatory Visit: Payer: Self-pay | Admitting: Cardiology

## 2014-05-28 ENCOUNTER — Other Ambulatory Visit: Payer: Self-pay | Admitting: Cardiology

## 2014-07-01 ENCOUNTER — Other Ambulatory Visit: Payer: Self-pay

## 2014-07-01 MED ORDER — NITROGLYCERIN 0.4 MG SL SUBL: 0.4000 mg | SUBLINGUAL_TABLET | SUBLINGUAL | Status: DC | PRN

## 2014-07-08 ENCOUNTER — Encounter: Payer: Self-pay | Admitting: Cardiology

## 2014-07-08 ENCOUNTER — Ambulatory Visit (INDEPENDENT_AMBULATORY_CARE_PROVIDER_SITE_OTHER): Payer: BLUE CROSS/BLUE SHIELD | Admitting: Cardiology

## 2014-07-08 VITALS — BP 120/80 | HR 65 | Ht 70.0 in | Wt 203.0 lb

## 2014-07-08 DIAGNOSIS — I2510 Atherosclerotic heart disease of native coronary artery without angina pectoris: Secondary | ICD-10-CM | POA: Diagnosis not present

## 2014-07-08 DIAGNOSIS — I4729 Other ventricular tachycardia: Secondary | ICD-10-CM

## 2014-07-08 DIAGNOSIS — E785 Hyperlipidemia, unspecified: Secondary | ICD-10-CM

## 2014-07-08 DIAGNOSIS — I472 Ventricular tachycardia: Secondary | ICD-10-CM | POA: Diagnosis not present

## 2014-07-08 DIAGNOSIS — E038 Other specified hypothyroidism: Secondary | ICD-10-CM

## 2014-07-08 DIAGNOSIS — I2583 Coronary atherosclerosis due to lipid rich plaque: Principal | ICD-10-CM

## 2014-07-08 MED ORDER — ATORVASTATIN CALCIUM 80 MG PO TABS: 80.0000 mg | ORAL_TABLET | Freq: Every day | ORAL | Status: DC

## 2014-07-08 MED ORDER — SOTALOL HCL 120 MG PO TABS: 120.0000 mg | ORAL_TABLET | Freq: Two times a day (BID) | ORAL | Status: DC

## 2014-07-08 MED ORDER — RAMIPRIL 2.5 MG PO CAPS
2.5000 mg | ORAL_CAPSULE | Freq: Every day | ORAL | Status: DC
Start: 2014-07-08 — End: 2015-06-09

## 2014-07-08 MED ORDER — NITROGLYCERIN 0.4 MG SL SUBL: 0.4000 mg | SUBLINGUAL_TABLET | SUBLINGUAL | Status: DC | PRN

## 2014-07-08 NOTE — Patient Instructions (Signed)
Continue your current therapy  I will see you in one year.  Please send me a copy of your lab work.

## 2014-07-08 NOTE — Progress Notes (Signed)
[name] Date of Birth: [birth year] Medical Record #[id number]  History of Present Illness: [name] is seen for followup CAD and VT . He has a significant history of coronary disease. He had his first myocardial infarction at age 55. At that time he had stenting of the proximal right coronary with a 4.0 x 13 mm MultiLink stent and the distal vessel with a 3.0 x 8 mm MultiLink stent. In June of 2010 he again had an acute inferior myocardial infarction related to occlusion of the distal right coronary. The distal vessel was stented with a 2.5 x 24 mm Taxus stent and a 2.75 x 20 mm Taxus stent at the crux. He had followup cardiac catheterization in September of 2010 which demonstrated nonobstructive disease and ejection fraction of 55%. His last evaluation with cardiac catheterization in July of 2013 showed nonobstructive disease as well.  He also has a history of symptomatic monomorphic ventricular tachycardia that has been treated with Betapace therapy. On followup today he states he is doing well from a cardiac standpoint. Denies palpitations, chest pain, SOB, or dizziness. He iis very stressed with his work and does have occasional anxiety attacks. s   Current Outpatient Prescriptions on File Prior to Visit  Medication Sig Dispense Refill  . amphetamine-dextroamphetamine (ADDERALL) 20 MG tablet     . levothyroxine (SYNTHROID, LEVOTHROID) 50 MCG tablet Take 50 mcg by mouth daily.    . pioglitazone-metformin (ACTOPLUS MET) 15-850 MG per tablet Take 1 tablet by mouth 2 (two) times daily with a meal.    . sertraline (ZOLOFT) 100 MG tablet     . temazepam (RESTORIL) 30 MG capsule      No current facility-administered medications on file prior to visit.    Allergies  Allergen Reactions  . Crestor [Rosuvastatin Calcium]     ITCHING AND PALPITATIONS  . Penicillins     Past Medical History  Diagnosis Date  . Coronary artery disease   . Ventricular tachycardia     Symptomatic ventricular  tachycardia  . Old inferior myocardial infarction   . Diabetes mellitus   . Dyslipidemia   . History of tobacco use   . Gout   . Parathyroid adenoma   . Thyroid disease   . Hypertension   . Hypothyroidism   . Sleep apnea     does not use cpap  . Arthritis     Past Surgical History  Procedure Laterality Date  . Cardiac catheterization  11/2008    nonobstructive CAD, left main normal, LAD normal, minor LCx irregularities <10%, mRCA 30% ISR, dRCA 30% narrowing proximal to PDA and PLB bifurcation; stented segments widely patent, LVEF 55%; severe inferior basal wall hypokinesis  . Other surgical history      Status post removal of parathyroid adenoma  . Cholecystectomy    . Cardiac catheterization  08/2004    Acute inferior MI 2/2 dRCA occlusion s/p 2.5 x 24 mm Taxus stent and a 2.75 x 20 mm Taxus stent at the crux; LVEF 45%, moderate akinesis of the inferior wall  . Cardiac catheterization  2000    pRCA 4.0 x 13 mm MultiLink stent, dRCA 3.0 x 8 mm MultiLink stent  . Parathroid    . Cardiac catheterization  10/06/11    normal left main, LAD, D1-3, LCx and OM1-OM3; 20% prox RCA stenosis, 40-50% ISR mid RCA, 20% ISR distal RCA; LVEF 45-50%; mild basal inferior wall hypokinesis.  . Left heart catheterization with coronary angiogram N/A 10/06/2011  Procedure: LEFT HEART CATHETERIZATION WITH CORONARY ANGIOGRAM;  Surgeon: Wellington Hampshire, MD;  Location: [facility];  Service: Cardiovascular;  Laterality: N/A;    History  Smoking status  . Former Smoker  . Quit date: 10/04/1997  Smokeless tobacco  . Current User  . Types: Snuff    History  Alcohol Use No    Family History  Problem Relation Age of Onset  . Heart attack Father     Review of Systems: As noted in history of present illness.  All other systems were reviewed and are negative.  Physical Exam: BP 120/80 mmHg  Pulse 65  Ht 5' 10"  (1.778 m)  Wt 203 lb (92.08 kg)  BMI 29.13 kg/m2 He is a WD white male in no  acute distress.  The HEENT exam is normal. The carotids are 2+ without bruits.  There is no thyromegaly.  There is no JVD.  The lungs are clear.   The heart exam reveals a regular rate with a normal S1 and S2.  There are no murmurs, gallops, or rubs.  The PMI is not displaced.   Abdominal exam reveals good bowel sounds.    There is no hepatosplenomegaly or tenderness.  There are no masses.  Exam of the legs reveal no clubbing, cyanosis, or edema.  The legs are without rashes.  The distal pulses are intact.  Cranial nerves II - XII are intact.  Motor and sensory functions are intact.  The gait is normal.  LABORATORY DATA: Ecg today shows NSR with old inferior infarct. LAD. QTc is normal at 425 msec. I have personally reviewed and interpreted this study.   Assessment / Plan: 1. Coronary disease with multiple interventions as noted in history of present illness. Last cath in 2013 showed nonobstructive disease. Patient is having no significant anginal symptoms. Continue aspirin therapy.  2. Ventricular tachycardia. This is well controlled with sotalol therapy. QTC is normal. He is asymptomatic  3. Dyslipidemia. Continue Lipitor high dose. Labs followed by Dr. Rosanna Randy. Request a copy.  4. Diabetes mellitus type 2.  5. Hypothyroidism.  6. Anxiety/depression.  Increase aerobic exercise. I'll followup again in one year.Marland Kitchen

## 2014-08-26 DIAGNOSIS — F319 Bipolar disorder, unspecified: Secondary | ICD-10-CM | POA: Insufficient documentation

## 2014-08-26 DIAGNOSIS — S4990XA Unspecified injury of shoulder and upper arm, unspecified arm, initial encounter: Secondary | ICD-10-CM | POA: Insufficient documentation

## 2014-08-26 DIAGNOSIS — I10 Essential (primary) hypertension: Secondary | ICD-10-CM | POA: Insufficient documentation

## 2014-08-26 DIAGNOSIS — E213 Hyperparathyroidism, unspecified: Secondary | ICD-10-CM | POA: Insufficient documentation

## 2014-08-26 DIAGNOSIS — M109 Gout, unspecified: Secondary | ICD-10-CM | POA: Insufficient documentation

## 2014-08-26 DIAGNOSIS — F32A Depression, unspecified: Secondary | ICD-10-CM | POA: Insufficient documentation

## 2014-08-26 DIAGNOSIS — F988 Other specified behavioral and emotional disorders with onset usually occurring in childhood and adolescence: Secondary | ICD-10-CM | POA: Insufficient documentation

## 2014-08-26 DIAGNOSIS — G4700 Insomnia, unspecified: Secondary | ICD-10-CM | POA: Insufficient documentation

## 2014-08-26 DIAGNOSIS — N200 Calculus of kidney: Secondary | ICD-10-CM | POA: Insufficient documentation

## 2014-08-26 DIAGNOSIS — E875 Hyperkalemia: Secondary | ICD-10-CM | POA: Insufficient documentation

## 2014-08-26 DIAGNOSIS — F330 Major depressive disorder, recurrent, mild: Secondary | ICD-10-CM | POA: Insufficient documentation

## 2014-08-26 DIAGNOSIS — E039 Hypothyroidism, unspecified: Secondary | ICD-10-CM | POA: Insufficient documentation

## 2014-08-26 DIAGNOSIS — R238 Other skin changes: Secondary | ICD-10-CM | POA: Insufficient documentation

## 2014-08-26 DIAGNOSIS — M7030 Other bursitis of elbow, unspecified elbow: Secondary | ICD-10-CM | POA: Insufficient documentation

## 2014-08-26 DIAGNOSIS — W57XXXA Bitten or stung by nonvenomous insect and other nonvenomous arthropods, initial encounter: Secondary | ICD-10-CM | POA: Insufficient documentation

## 2014-08-26 DIAGNOSIS — F411 Generalized anxiety disorder: Secondary | ICD-10-CM | POA: Insufficient documentation

## 2014-08-26 DIAGNOSIS — E119 Type 2 diabetes mellitus without complications: Secondary | ICD-10-CM | POA: Insufficient documentation

## 2014-08-26 DIAGNOSIS — F329 Major depressive disorder, single episode, unspecified: Secondary | ICD-10-CM | POA: Insufficient documentation

## 2014-10-03 ENCOUNTER — Other Ambulatory Visit: Payer: Self-pay | Admitting: Family Medicine

## 2014-10-11 ENCOUNTER — Other Ambulatory Visit: Payer: Self-pay

## 2014-10-11 MED ORDER — PIOGLITAZONE HCL-METFORMIN HCL 15-850 MG PO TABS: 1.0000 | ORAL_TABLET | Freq: Two times a day (BID) | ORAL | Status: DC

## 2014-10-21 ENCOUNTER — Ambulatory Visit (INDEPENDENT_AMBULATORY_CARE_PROVIDER_SITE_OTHER): Payer: BLUE CROSS/BLUE SHIELD | Admitting: Family Medicine

## 2014-10-21 ENCOUNTER — Encounter: Payer: Self-pay | Admitting: Family Medicine

## 2014-10-21 VITALS — BP 128/70 | HR 64 | Temp 98.0°F | Resp 16 | Wt 201.0 lb

## 2014-10-21 DIAGNOSIS — E785 Hyperlipidemia, unspecified: Secondary | ICD-10-CM | POA: Diagnosis not present

## 2014-10-21 DIAGNOSIS — E118 Type 2 diabetes mellitus with unspecified complications: Secondary | ICD-10-CM | POA: Diagnosis not present

## 2014-10-21 DIAGNOSIS — E039 Hypothyroidism, unspecified: Secondary | ICD-10-CM | POA: Diagnosis not present

## 2014-10-21 DIAGNOSIS — M109 Gout, unspecified: Secondary | ICD-10-CM

## 2014-10-21 DIAGNOSIS — I10 Essential (primary) hypertension: Secondary | ICD-10-CM

## 2014-10-21 LAB — POCT GLYCOSYLATED HEMOGLOBIN (HGB A1C): Hemoglobin A1C: 10.3

## 2014-10-21 NOTE — Progress Notes (Signed)
Patient ID: [name], male   DOB: [birth year], 54 y.o.   MRN: [id number]    Subjective:  HPI Pt reports that he got his medications confused He was taking triple the amount of Lipitor ( 1 am and 2 pm) and NOT his Metformin. He noticed this about a week ago and started taking his metformin again. He noticed that his blood sugars are not coming down they are still running in the 300's.    Last A1c was done in 05/2014-- it was not documented. But pt believes it was high 6's or low 7's.   Prior to Admission medications   Medication Sig Start Date End Date Taking? Authorizing Provider  amphetamine-dextroamphetamine (ADDERALL) 20 MG tablet  03/26/13  Yes Historical Provider, MD  amphetamine-dextroamphetamine (ADDERALL) 20 MG tablet Take by mouth. 12/26/12  Yes Historical Provider, MD  aspirin 81 MG tablet Take by mouth. 12/26/12  Yes Historical Provider, MD  atorvastatin (LIPITOR) 80 MG tablet Take by mouth. 01/10/12  Yes Historical Provider, MD  colchicine 0.6 MG tablet Take by mouth. 05/22/14  Yes Historical Provider, MD  lamoTRIgine (LAMICTAL) 25 MG tablet Take by mouth.   Yes Historical Provider, MD  levothyroxine (SYNTHROID, LEVOTHROID) 50 MCG tablet TAKE 1 TABLET DAILY 10/03/14  Yes [name], MD  naproxen (NAPROSYN) 500 MG tablet Take by mouth. 05/22/14  Yes Historical Provider, MD  nitroGLYCERIN (NITROSTAT) 0.4 MG SL tablet Place 1 tablet (0.4 mg total) under the tongue every 5 (five) minutes x 3 doses as needed for chest pain. 07/08/14 07/08/15 Yes [name] [name], MD  pioglitazone-metformin (ACTOPLUS MET) [phone] MG per tablet Take 1 tablet by mouth 2 (two) times daily. 10/11/14  Yes Jonnathan Birman Maceo Pro., MD  ramipril (ALTACE) 2.5 MG capsule Take by mouth. 10/24/13  Yes Historical Provider, MD  sertraline (ZOLOFT) 100 MG tablet Take by mouth. 12/26/12  Yes Historical Provider, MD  sotalol (BETAPACE) 120 MG tablet Take 1 tablet (120 mg total) by mouth 2 (two) times daily. TAKE 1 TABLET TWICE  A DAY 07/08/14  Yes [name] [name], MD  SOTALOL AF 120 MG TABS Take by mouth. 12/17/10  Yes Historical Provider, MD  temazepam (RESTORIL) 30 MG capsule Take by mouth. 05/01/13  Yes Historical Provider, MD    Patient Active Problem List   Diagnosis Date Noted  . 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 ADD (attention deficit disorder) 08/26/2014  . Multiple blisters 08/26/2014  . Clinical depression 08/26/2014  . Diabetes mellitus, type 2 08/26/2014  . Essential (primary) hypertension 08/26/2014  . Anxiety, generalized 08/26/2014  . High potassium 08/26/2014  . HPTH (hyperparathyroidism) 08/26/2014  . BP (high blood pressure) 08/26/2014  . Bug bite with infection 08/26/2014  . Injury, shoulder and upper arm 08/26/2014  . Cannot sleep 08/26/2014  . Calculus of kidney 08/26/2014  . Depression, major, recurrent, mild 08/26/2014  . Bursitis of elbow 08/26/2014  . 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 Adult hypothyroidism 08/26/2014  . Chest pain 10/06/2011  . Type 2 diabetes mellitus 10/05/2011  . Hypothyroidism 10/05/2011  . Anxiety 10/05/2011  . CAD (coronary artery disease) 06/29/2011  . Old MI (myocardial infarction) 06/29/2011  . Hyperlipidemia 06/29/2011  . Ventricular tachycardia, monomorphic 06/29/2011    Past Medical History  Diagnosis Date  . Coronary artery disease   . Ventricular tachycardia     Symptomatic ventricular tachycardia  . Old inferior myocardial infarction   . Diabetes mellitus   . Dyslipidemia   . History of tobacco  use   . Gout   . Parathyroid adenoma   . Thyroid disease   . Hypertension   . Hypothyroidism   . Sleep apnea     does not use cpap  . Arthritis     History   Social History  . Marital Status: Married    Spouse Name: N/A  . Number of Children: 3  . 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 Smoking status: Former Smoker    Quit date: 10/04/1997  . Smokeless tobacco: Current  User    Types: Snuff  . Alcohol Use: No  . Drug Use: No  . Sexual Activity: Yes   Other Topics Concern  . Not on file   Social History Narrative   Lives in [place], [place] with wife.     Allergies  Allergen Reactions  . Crestor [Rosuvastatin Calcium]     ITCHING AND PALPITATIONS  . Penicillins     As a child; took as adult and no reaction.    Review of Systems  Eyes: Negative.   Respiratory: Negative.   Cardiovascular: Negative.   Gastrointestinal: Negative.   Genitourinary: Negative.   Musculoskeletal: Negative.   Skin: Negative.   Neurological: Negative.   Endo/Heme/Allergies: Negative.   Psychiatric/Behavioral: Negative.   All other systems reviewed and are negative.   Immunization History  Administered Date(s) Administered  . Tdap 12/17/2010   Objective:  BP 128/70 mmHg  Pulse 64  Temp(Src) 98 F (36.7 C) (Oral)  Resp 16  Wt 201 lb (91.173 kg)  Physical Exam  Constitutional: He is oriented to person, place, and time and well-developed, well-nourished, and in no distress.  HENT:  Head: Normocephalic and atraumatic.  Right Ear: External ear normal.  Left Ear: External ear normal.  Nose: Nose normal.  Eyes: Conjunctivae and EOM are normal. Pupils are equal, round, and reactive to light.  Neck: Normal range of motion. Neck supple.  Cardiovascular: Normal rate, regular rhythm, normal heart sounds and intact distal pulses.   Pulmonary/Chest: Effort normal and breath sounds normal.  Musculoskeletal: Normal range of motion.  Neurological: He is alert and oriented to person, place, and time. He has normal reflexes. Gait normal. GCS score is 15.  Skin: Skin is warm and dry.  Psychiatric: Mood, memory, affect and judgment normal.    Lab Results  Component Value Date   WBC 8.6 10/06/2011   HGB 13.6 10/06/2011   HCT 37.8* 10/06/2011   PLT 149* 10/06/2011   GLUCOSE 153* 10/06/2011   CHOL 104 10/11/2013   TRIG 92 10/11/2013   HDL 34* 10/11/2013   LDLCALC  52 10/11/2013   TSH 4.85 10/11/2013   INR 1.10 10/05/2011   HGBA1C 7.7* 09/19/2013    CMP     Component Value Date/Time   NA 142 10/22/2013   NA 142 10/06/2011 0430   K 4.8 10/22/2013   CL 104 10/06/2011 0430   CO2 26 10/06/2011 0430   GLUCOSE 153* 10/06/2011 0430   BUN 21 10/22/2013   BUN 10 10/06/2011 0430   CREATININE 0.9 10/22/2013   CREATININE 0.70 10/06/2011 0430   CALCIUM 8.2* 10/06/2011 0430   PROT 6.8 12/03/2008 1810   ALBUMIN 4.1 12/03/2008 1810   AST 16 10/11/2013   ALT 84* 10/11/2013   ALKPHOS 84 10/11/2013   BILITOT 0.9 12/03/2008 1810   GFRNONAA >90 10/06/2011 0430   GFRAA >90 10/06/2011 0430    Assessment and  Plan :   1. Type 2 diabetes mellitus with complication F/u 2-4 weeks and monitor blood sugars. Will bring in fasting blood sugar and this will determine if we add another medication or not.  - POCT HgB A1C - COMPLETE METABOLIC PANEL WITH GFR  2. Hypothyroidism, unspecified hypothyroidism type  - TSH  3. Essential (primary) hypertension  - CBC with Differential/Platelet  4. Gout, unspecified cause, unspecified chronicity, unspecified site  - Uric acid  5. Hyperlipidemia  - Lipid Panel With LDL/HDL Ratio Pt had labs ordered at [facility] and did not get them done, so he requested a new lab order today.   6. CAD without angina Patient was seen and examined by Dr. [name], and noted scribed by Webb Laws, Muddy MD [facility] 10/21/2014 3:50 PM

## 2014-11-06 ENCOUNTER — Encounter: Payer: Self-pay | Admitting: Family Medicine

## 2014-12-31 ENCOUNTER — Other Ambulatory Visit: Payer: Self-pay | Admitting: Family Medicine

## 2015-01-29 ENCOUNTER — Ambulatory Visit: Payer: BLUE CROSS/BLUE SHIELD | Admitting: Family Medicine

## 2015-02-03 ENCOUNTER — Ambulatory Visit (INDEPENDENT_AMBULATORY_CARE_PROVIDER_SITE_OTHER): Payer: BLUE CROSS/BLUE SHIELD | Admitting: Family Medicine

## 2015-02-03 VITALS — BP 122/68 | HR 60 | Temp 98.9°F | Resp 14 | Wt 201.0 lb

## 2015-02-03 DIAGNOSIS — M10069 Idiopathic gout, unspecified knee: Secondary | ICD-10-CM | POA: Diagnosis not present

## 2015-02-03 DIAGNOSIS — E119 Type 2 diabetes mellitus without complications: Secondary | ICD-10-CM | POA: Diagnosis not present

## 2015-02-03 DIAGNOSIS — I10 Essential (primary) hypertension: Secondary | ICD-10-CM

## 2015-02-03 DIAGNOSIS — E785 Hyperlipidemia, unspecified: Secondary | ICD-10-CM | POA: Diagnosis not present

## 2015-02-03 DIAGNOSIS — M109 Gout, unspecified: Secondary | ICD-10-CM

## 2015-02-03 NOTE — Progress Notes (Signed)
Patient ID: [name], male   DOB: September 09, 1960, 54 y.o.   MRN: [id number]    [name]  MRN: [id number] DOB: 1960-12-17  Subjective:  HPI   1. Essential hypertension Patient is a 54 year old male who presents for follow up of hypertension.  His last visit was 10/21/14 and his blood pressure at that time was 128/70.  No management changes were made at that time.  He isn ot checking his pressure at home.  2. Type 2 diabetes mellitus without complication, without long-term current use of insulin ([place]) Patient is also here for his diabetes.  His last A1C in August was 10.3.  He is compliant with his medication.  He has not been checking his glucose as he stated that his machine has been broken.  We will give him a new machine today.  3. Hyperlipidemia Patient did not have his labs done in August and would like to get another order today.  4. Gout of knee, unspecified cause, unspecified chronicity, unspecified laterality Patient did not have his labs done in August and would like to get another order today.   Patient Active Problem List   Diagnosis Date Noted  . Gout 10/21/2014  . 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 ADD (attention deficit disorder) 08/26/2014  . Multiple blisters 08/26/2014  . Clinical depression 08/26/2014  . Diabetes mellitus, type 2 ([place]) 08/26/2014  . Essential (primary) hypertension 08/26/2014  . Anxiety, generalized 08/26/2014  . High potassium 08/26/2014  . HPTH (hyperparathyroidism) ([place]) 08/26/2014  . BP (high blood pressure) 08/26/2014  . Bug bite with infection 08/26/2014  . Injury, shoulder and upper arm 08/26/2014  . Cannot sleep 08/26/2014  . Calculus of kidney 08/26/2014  . Depression, major, recurrent, mild ([place]) 08/26/2014  . Bursitis of elbow 08/26/2014  . Affective bipolar disorder ([place]) 08/26/2014  . Adult hypothyroidism 08/26/2014  . Chest pain 10/06/2011  . Type 2 diabetes mellitus ([place]) 10/05/2011  . Hypothyroidism 10/05/2011  . Anxiety  10/05/2011  . CAD (coronary artery disease) 06/29/2011  . Old MI (myocardial infarction) 06/29/2011  . Hyperlipidemia 06/29/2011  . Ventricular tachycardia, monomorphic ([place]) 06/29/2011    Past Medical History  Diagnosis Date  . Coronary artery disease   . Ventricular tachycardia     Symptomatic ventricular tachycardia  . Old inferior myocardial infarction   . Diabetes mellitus   . Dyslipidemia   . History of tobacco use   . Gout   . Parathyroid adenoma   . Thyroid disease   . Hypertension   . Hypothyroidism   . Sleep apnea     does not use cpap  . Arthritis     Social History   Social History  . Marital Status: Married    Spouse Name: N/A  . Number of Children: 3  . 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 Smoking status: Former Smoker    Quit date: 10/04/1997  . Smokeless tobacco: Current User    Types: Snuff  . Alcohol Use: No  . Drug Use: No  . Sexual Activity: Yes   Other Topics Concern  . Not on file   Social History Narrative   Lives in [place], [place] with wife.     Outpatient Prescriptions Prior to Visit  Medication Sig Dispense Refill  . amphetamine-dextroamphetamine (ADDERALL) 20 MG tablet Take by mouth.    Marland Kitchen aspirin 81 MG tablet Take by mouth.    Marland Kitchen  atorvastatin (LIPITOR) 80 MG tablet Take by mouth.    . colchicine 0.6 MG tablet Take by mouth.    . lamoTRIgine (LAMICTAL) 25 MG tablet Take by mouth.    . levothyroxine (SYNTHROID, LEVOTHROID) 50 MCG tablet TAKE 1 TABLET DAILY 90 tablet 2  . naproxen (NAPROSYN) 500 MG tablet Take by mouth.    . nitroGLYCERIN (NITROSTAT) 0.4 MG SL tablet Place 1 tablet (0.4 mg total) under the tongue every 5 (five) minutes x 3 doses as needed for chest pain. 25 tablet 6  . pioglitazone-metformin (ACTOPLUS MET) 15-850 MG tablet TAKE 1 TABLET TWICE A DAY 180 tablet 2  . ramipril (ALTACE) 2.5 MG capsule Take by mouth.    . sertraline (ZOLOFT) 100 MG tablet Take  by mouth.    . sotalol (BETAPACE) 120 MG tablet Take 1 tablet (120 mg total) by mouth 2 (two) times daily. TAKE 1 TABLET TWICE A DAY 180 tablet 3  . SOTALOL AF 120 MG TABS Take by mouth.    . temazepam (RESTORIL) 30 MG capsule Take by mouth.    Marland Kitchen amphetamine-dextroamphetamine (ADDERALL) 20 MG tablet      No facility-administered medications prior to visit.    Allergies  Allergen Reactions  . Crestor [Rosuvastatin Calcium]     ITCHING AND PALPITATIONS  . Penicillins     As a child; took as adult and no reaction.    Review of Systems  Constitutional: Negative.   HENT: Negative.   Eyes: Negative.   Respiratory: Negative.   Cardiovascular: Negative.   Gastrointestinal: Negative.   Musculoskeletal: Negative for myalgias, back pain, joint pain and neck pain.  Skin: Negative.   Neurological: Negative.  Negative for headaches.  Psychiatric/Behavioral: Negative.    Objective:  BP 122/68 mmHg  Pulse 60  Temp(Src) 98.9 F (37.2 C) (Oral)  Resp 14  Wt 201 lb (91.173 kg)  Physical Exam  Assessment and Plan :   1. Essential hypertension  - CBC With Differential/Platelet - COMPLETE METABOLIC PANEL WITH GFR - TSH  2. Type 2 diabetes mellitus without complication, without long-term current use of insulin (H[facility])  - Lipid Panel With LDL/HDL Ratio - Hemoglobin A1c  3. Hyperlipidemia   4. Gout of knee, unspecified cause, unspecified chronicity, unspecified laterality  - Uric acid 5.CAD All risk factors treated 6. Major depressive disorder In remission. Also a positive thing might be the retirement of the patient in the next few months from his work. This is been a major stressor the last 2 years. 7. ADD Jeremiah Aschoff MD [facility] 02/03/2015 3:23 PM

## 2015-02-14 LAB — LIPID PANEL WITH LDL/HDL RATIO
Cholesterol, Total: 111 mg/dL (ref 100–199)
HDL: 31 mg/dL — ABNORMAL LOW (ref >39)
LDL CALC: 57 mg/dL (ref 0–99)
LDl/HDL Ratio: 1.8 ratio units (ref 0.0–3.6)
TRIGLYCERIDES: 117 mg/dL (ref 0–149)
VLDL CHOLESTEROL CAL: 23 mg/dL (ref 5–40)

## 2015-02-14 LAB — COMPREHENSIVE METABOLIC PANEL
A/G RATIO: 1.8 (ref 1.1–2.5)
ALK PHOS: 104 IU/L (ref 39–117)
ALT: 19 IU/L (ref 0–44)
AST: 20 IU/L (ref 0–40)
Albumin: 4.6 g/dL (ref 3.5–5.5)
BUN/Creatinine Ratio: 23 — ABNORMAL HIGH (ref 9–20)
BUN: 19 mg/dL (ref 6–24)
Bilirubin Total: 0.4 mg/dL (ref 0.0–1.2)
CO2: 26 mmol/L (ref 18–29)
CREATININE: 0.84 mg/dL (ref 0.76–1.27)
Calcium: 9.1 mg/dL (ref 8.7–10.2)
Chloride: 97 mmol/L (ref 97–106)
GFR calc Af Amer: 113 mL/min/{1.73_m2} (ref >59)
GFR calc non Af Amer: 98 mL/min/{1.73_m2} (ref >59)
GLOBULIN, TOTAL: 2.5 g/dL (ref 1.5–4.5)
Glucose: 200 mg/dL — ABNORMAL HIGH (ref 65–99)
Potassium: 4.8 mmol/L (ref 3.5–5.2)
Sodium: 140 mmol/L (ref 136–144)
Total Protein: 7.1 g/dL (ref 6.0–8.5)

## 2015-02-14 LAB — CBC WITH DIFFERENTIAL/PLATELET
BASOS: 1 %
Basophils Absolute: 0 10*3/uL (ref 0.0–0.2)
EOS (ABSOLUTE): 0.3 10*3/uL (ref 0.0–0.4)
EOS: 3 %
HEMATOCRIT: 45.7 % (ref 37.5–51.0)
HEMOGLOBIN: 15.1 g/dL (ref 12.6–17.7)
Immature Grans (Abs): 0 10*3/uL (ref 0.0–0.1)
Immature Granulocytes: 0 %
LYMPHS ABS: 2.4 10*3/uL (ref 0.7–3.1)
Lymphs: 30 %
MCH: 33.2 pg — ABNORMAL HIGH (ref 26.6–33.0)
MCHC: 33 g/dL (ref 31.5–35.7)
MCV: 100 fL — AB (ref 79–97)
MONOCYTES: 4 %
Monocytes Absolute: 0.3 10*3/uL (ref 0.1–0.9)
NEUTROS PCT: 62 %
Neutrophils Absolute: 4.9 10*3/uL (ref 1.4–7.0)
Platelets: 234 10*3/uL (ref 150–379)
RBC: 4.55 x10E6/uL (ref 4.14–5.80)
RDW: 13.8 % (ref 12.3–15.4)
WBC: 7.9 10*3/uL (ref 3.4–10.8)

## 2015-02-14 LAB — TSH: TSH: 12.45 u[IU]/mL — ABNORMAL HIGH (ref 0.450–4.500)

## 2015-02-14 LAB — URIC ACID: URIC ACID: 5.1 mg/dL (ref 3.7–8.6)

## 2015-02-14 LAB — HEMOGLOBIN A1C
Est. average glucose Bld gHb Est-mCnc: 203 mg/dL
Hgb A1c MFr Bld: 8.7 % — ABNORMAL HIGH (ref 4.8–5.6)

## 2015-02-17 ENCOUNTER — Telehealth: Payer: Self-pay

## 2015-02-17 DIAGNOSIS — E039 Hypothyroidism, unspecified: Secondary | ICD-10-CM

## 2015-02-17 MED ORDER — LEVOTHYROXINE SODIUM 100 MCG PO TABS: 100.0000 ug | ORAL_TABLET | Freq: Every day | ORAL | Status: DC

## 2015-02-17 NOTE — Telephone Encounter (Signed)
-----   Message from [name], MD sent at 02/17/2015 10:21 AM EST ----- Lipids stable increase synthroid from 50 to 180mcg daily

## 2015-02-17 NOTE — Telephone Encounter (Signed)
Advised patient as below. Sent in medication into pharmacy.  

## 2015-06-03 ENCOUNTER — Ambulatory Visit (INDEPENDENT_AMBULATORY_CARE_PROVIDER_SITE_OTHER): Payer: Managed Care, Other (non HMO) | Admitting: Family Medicine

## 2015-06-03 VITALS — BP 110/62 | HR 84 | Temp 97.8°F | Resp 16 | Wt 202.0 lb

## 2015-06-03 DIAGNOSIS — E039 Hypothyroidism, unspecified: Secondary | ICD-10-CM

## 2015-06-03 DIAGNOSIS — E119 Type 2 diabetes mellitus without complications: Secondary | ICD-10-CM

## 2015-06-03 NOTE — Progress Notes (Signed)
Patient ID: [name], male   DOB: [birth year], 55 y.o.   MRN: [id number]   [name]  MRN: [id number] DOB: 03-19-1961  Subjective:  HPI   1. Type 2 diabetes mellitus without complication, without long-term current use of insulin [facility]) The patient is a 55 year old male who presents today for follow up of his diabetes.  He states that his fasting glucose has been running about 140 and his post prandial are sometimes 300.  His last on 02/12/15 was 8.7. Patient trying to make plans to retire from [employer] in the next year.  Patient Active Problem List   Diagnosis Date Noted  . Gout 10/21/2014  . 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 ADD (attention deficit disorder) 08/26/2014  . Multiple blisters 08/26/2014  . Clinical depression 08/26/2014  . Diabetes mellitus, type 2 ([place]) 08/26/2014  . Essential (primary) hypertension 08/26/2014  . Anxiety, generalized 08/26/2014  . High potassium 08/26/2014  . HPTH (hyperparathyroidism) ([place]) 08/26/2014  . BP (high blood pressure) 08/26/2014  . Bug bite with infection 08/26/2014  . Injury, shoulder and upper arm 08/26/2014  . Cannot sleep 08/26/2014  . Calculus of kidney 08/26/2014  . Depression, major, recurrent, mild ([place]) 08/26/2014  . Bursitis of elbow 08/26/2014  . Affective bipolar disorder ([place]) 08/26/2014  . Adult hypothyroidism 08/26/2014  . Chest pain 10/06/2011  . Type 2 diabetes mellitus ([place]) 10/05/2011  . Hypothyroidism 10/05/2011  . Anxiety 10/05/2011  . CAD (coronary artery disease) 06/29/2011  . Old MI (myocardial infarction) 06/29/2011  . Hyperlipidemia 06/29/2011  . 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 Coronary artery disease   . Ventricular tachycardia     Symptomatic ventricular tachycardia  . Old inferior myocardial infarction   . Diabetes mellitus   . Dyslipidemia   . History of tobacco use   . Gout   . Parathyroid adenoma   . Thyroid  disease   . Hypertension   . Hypothyroidism   . Sleep apnea     does not use cpap  . Arthritis     Social History   Social History  . Marital Status: Married    Spouse Name: N/A  . Number of Children: 3  . 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 Smoking status: Former Smoker    Quit date: 10/04/1997  . Smokeless tobacco: Current User    Types: Snuff  . Alcohol Use: No  . Drug Use: No  . Sexual Activity: Yes   Other Topics Concern  . Not on file   Social History Narrative   Lives in [place], [place] with wife.     Outpatient Prescriptions Prior to Visit  Medication Sig Dispense Refill  . amphetamine-dextroamphetamine (ADDERALL) 20 MG tablet Take by mouth.    Marland Kitchen aspirin 81 MG tablet Take by mouth.    Marland Kitchen atorvastatin (LIPITOR) 80 MG tablet Take by mouth.    . colchicine 0.6 MG tablet Take by mouth.    . lamoTRIgine (LAMICTAL) 25 MG tablet Take by mouth.    . levothyroxine (SYNTHROID, LEVOTHROID) 100 MCG tablet Take 1 tablet (100 mcg total) by mouth daily before breakfast. 90 tablet 3  . naproxen (NAPROSYN) 500 MG tablet Take by mouth.    . nitroGLYCERIN (NITROSTAT) 0.4 MG SL tablet Place 1 tablet (0.4 mg total) under the tongue every 5 (five) minutes x 3  doses as needed for chest pain. 25 tablet 6  . pioglitazone-metformin (ACTOPLUS MET) 15-850 MG tablet TAKE 1 TABLET TWICE A DAY 180 tablet 2  . ramipril (ALTACE) 2.5 MG capsule Take by mouth.    . sertraline (ZOLOFT) 100 MG tablet Take by mouth.    . sotalol (BETAPACE) 120 MG tablet Take 1 tablet (120 mg total) by mouth 2 (two) times daily. TAKE 1 TABLET TWICE A DAY 180 tablet 3  . temazepam (RESTORIL) 30 MG capsule Take by mouth.    Marland Kitchen SOTALOL AF 120 MG TABS Take by mouth.     No facility-administered medications prior to visit.    Allergies  Allergen Reactions  . Crestor [Rosuvastatin Calcium]     ITCHING AND PALPITATIONS  . Penicillins     As a child; took  as adult and no reaction.    Review of Systems  Constitutional: Negative for fever and malaise/fatigue.  Eyes: Negative.   Respiratory: Negative for cough, shortness of breath and wheezing.   Cardiovascular: Negative for chest pain, palpitations, orthopnea and leg swelling.  Gastrointestinal: Negative.   Neurological: Negative for weakness.  Endo/Heme/Allergies: Negative for polydipsia.  Psychiatric/Behavioral: Negative.    Objective:  BP 110/62 mmHg  Pulse 84  Temp(Src) 97.8 F (36.6 C) (Oral)  Resp 16  Wt 202 lb (91.627 kg)  Physical Exam  Constitutional: He is oriented to person, place, and time and well-developed, well-nourished, and in no distress.  HENT:  Head: Normocephalic and atraumatic.  Right Ear: External ear normal.  Left Ear: External ear normal.  Nose: Nose normal.  Neck: Neck supple. No thyromegaly present.  Cardiovascular: Normal rate, regular rhythm, normal heart sounds and intact distal pulses.   Pulmonary/Chest: Effort normal and breath sounds normal.  Abdominal: Soft.  Lymphadenopathy:    He has no cervical adenopathy.  Neurological: He is alert and oriented to person, place, and time.  Skin: Skin is warm and dry.  Psychiatric: Mood, memory, affect and judgment normal.    Assessment and Plan :  Type 2 diabetes mellitus without complication, without long-term current use of insulin (HCC) A1c is 7.9 today which is improved. CAD All risk factors treated Major depressive disorder/ADD I have done the exam and reviewed the above chart and it is accurate to the best of my knowledge.  [name] MD [facility] 06/03/2015 4:41 PM

## 2015-06-09 ENCOUNTER — Other Ambulatory Visit: Payer: Self-pay | Admitting: Cardiology

## 2015-06-09 NOTE — Telephone Encounter (Signed)
Rx request sent to pharmacy.  

## 2015-06-12 ENCOUNTER — Other Ambulatory Visit: Payer: Self-pay | Admitting: Cardiology

## 2015-06-12 NOTE — Telephone Encounter (Signed)
Rx(s) sent to pharmacy electronically.  

## 2015-08-12 ENCOUNTER — Ambulatory Visit (INDEPENDENT_AMBULATORY_CARE_PROVIDER_SITE_OTHER): Payer: Managed Care, Other (non HMO) | Admitting: Family Medicine

## 2015-08-12 ENCOUNTER — Encounter: Payer: Self-pay | Admitting: Family Medicine

## 2015-08-12 VITALS — BP 132/60 | HR 60 | Temp 98.2°F | Resp 14 | Wt 207.0 lb

## 2015-08-12 DIAGNOSIS — L259 Unspecified contact dermatitis, unspecified cause: Secondary | ICD-10-CM

## 2015-08-12 DIAGNOSIS — W57XXXA Bitten or stung by nonvenomous insect and other nonvenomous arthropods, initial encounter: Secondary | ICD-10-CM

## 2015-08-12 DIAGNOSIS — S40862A Insect bite (nonvenomous) of left upper arm, initial encounter: Secondary | ICD-10-CM | POA: Diagnosis not present

## 2015-08-12 MED ORDER — DOXYCYCLINE HYCLATE 100 MG PO TABS: 100.0000 mg | ORAL_TABLET | Freq: Two times a day (BID) | ORAL | Status: DC

## 2015-08-12 NOTE — Progress Notes (Signed)
Patient ID: [name], male   DOB: 04/30/1960, 55 y.o.   MRN: [id number]       Patient: [name] Male    DOB: 06-18-1960   55 y.o.   MRN: [id number] Visit Date: 08/12/2015  Today's Provider: Vernie Murders, PA   Chief Complaint  Patient presents with  . Insect Bite   Subjective:    HPI  Patient states that on Friday or Saturday May 20th he pulled a tick off of him. Since then he has had soreness and itchiness in that area-Left upper inner arm. He has not had a fever, he does feel sluggish today. The area of the bite is red, swollen and inflammed looking.   Patient Active Problem List   Diagnosis Date Noted  . Gout 10/21/2014  . 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 ADD (attention deficit disorder) 08/26/2014  . Multiple blisters 08/26/2014  . Clinical depression 08/26/2014  . Diabetes mellitus, type 2 ([place]) 08/26/2014  . Essential (primary) hypertension 08/26/2014  . Anxiety, generalized 08/26/2014  . High potassium 08/26/2014  . HPTH (hyperparathyroidism) ([place]) 08/26/2014  . BP (high blood pressure) 08/26/2014  . Bug bite with infection 08/26/2014  . Injury, shoulder and upper arm 08/26/2014  . Cannot sleep 08/26/2014  . Calculus of kidney 08/26/2014  . Depression, major, recurrent, mild ([place]) 08/26/2014  . Bursitis of elbow 08/26/2014  . Affective bipolar disorder ([place]) 08/26/2014  . Adult hypothyroidism 08/26/2014  . Chest pain 10/06/2011  . Type 2 diabetes mellitus ([place]) 10/05/2011  . Hypothyroidism 10/05/2011  . Anxiety 10/05/2011  . CAD (coronary artery disease) 06/29/2011  . Old MI (myocardial infarction) 06/29/2011  . Hyperlipidemia 06/29/2011  . 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 Cardiac catheterization  11/2008    nonobstructive CAD, left main normal, LAD normal, minor LCx irregularities <10%, mRCA 30% ISR, dRCA 30% narrowing proximal to PDA and PLB bifurcation; stented segments  widely patent, LVEF 55%; severe inferior basal wall hypokinesis  . Other surgical history      Status post removal of parathyroid adenoma  . Cholecystectomy    . Cardiac catheterization  08/2004    Acute inferior MI 2/2 dRCA occlusion s/p 2.5 x 24 mm Taxus stent and a 2.75 x 20 mm Taxus stent at the crux; LVEF 45%, moderate akinesis of the inferior wall  . Cardiac catheterization  2000    pRCA 4.0 x 13 mm MultiLink stent, dRCA 3.0 x 8 mm MultiLink stent  . Parathroid    . Cardiac catheterization  10/06/11    normal left main, LAD, D1-3, LCx and OM1-OM3; 20% prox RCA stenosis, 40-50% ISR mid RCA, 20% ISR distal RCA; LVEF 45-50%; mild basal inferior wall hypokinesis.  . Left heart catheterization with coronary angiogram N/A 10/06/2011    Procedure: LEFT HEART CATHETERIZATION WITH CORONARY ANGIOGRAM;  Surgeon: Wellington Hampshire, MD;  Location: [place] CATH LAB;  Service: Cardiovascular;  Laterality: N/A;   Family History  Problem Relation Age of Onset  . Heart attack Father   . Stroke Father   . Alzheimer's disease Father   . Diabetes Mother    Allergies  Allergen Reactions  . Crestor [Rosuvastatin Calcium]     ITCHING AND PALPITATIONS  . Penicillins     As a child; took as adult and no reaction.   Previous Medications   AMPHETAMINE-DEXTROAMPHETAMINE (ADDERALL) 20 MG TABLET    Take by mouth.   ASPIRIN 81 MG TABLET  Take by mouth.   ATORVASTATIN (LIPITOR) 80 MG TABLET    Take by mouth.   COLCHICINE 0.6 MG TABLET    Take by mouth.   LAMOTRIGINE (LAMICTAL) 25 MG TABLET    Take by mouth.   LEVOTHYROXINE (SYNTHROID, LEVOTHROID) 100 MCG TABLET    Take 1 tablet (100 mcg total) by mouth daily before breakfast.   NAPROXEN (NAPROSYN) 500 MG TABLET    Take by mouth.   NITROGLYCERIN (NITROSTAT) 0.4 MG SL TABLET    Place 1 tablet (0.4 mg total) under the tongue every 5 (five) minutes x 3 doses as needed for chest pain.   PIOGLITAZONE-METFORMIN (ACTOPLUS MET) 15-850 MG TABLET    TAKE 1 TABLET TWICE A  DAY   RAMIPRIL (ALTACE) 2.5 MG CAPSULE    Take 1 capsule (2.5 mg total) by mouth daily.   SERTRALINE (ZOLOFT) 100 MG TABLET    Take by mouth.   SOTALOL (BETAPACE) 120 MG TABLET    TAKE 1 TABLET TWICE A DAY   TEMAZEPAM (RESTORIL) 30 MG CAPSULE    Take by mouth.    Review of Systems  Constitutional: Positive for fatigue. Negative for fever, chills, activity change and appetite change.  Respiratory: Negative.   Cardiovascular: Negative.   Gastrointestinal: Negative.   Musculoskeletal: Positive for myalgias.  Skin: Positive for rash and wound.    Social History  Substance Use Topics  . Smoking status: Former Smoker    Quit date: 10/04/1997  . Smokeless tobacco: Current User    Types: Snuff  . Alcohol Use: No   Objective:   BP 132/60 mmHg  Pulse 60  Temp(Src) 98.2 F (36.8 C)  Resp 14  Wt 207 lb (93.895 kg)  Physical Exam  Constitutional: He is oriented to person, place, and time. He appears well-developed and well-nourished. No distress.  HENT:  Head: Normocephalic and atraumatic.  Right Ear: Hearing normal.  Left Ear: Hearing normal.  Nose: Nose normal.  Eyes: Conjunctivae and lids are normal. Right eye exhibits no discharge. Left eye exhibits no discharge. No scleral icterus.  Pulmonary/Chest: Effort normal. No respiratory distress.  Musculoskeletal: Normal range of motion.  Neurological: He is alert and oriented to person, place, and time.  Skin: Skin is intact. Rash noted. No lesion noted.  Weepy linear rash around the tick bite site in the left upper axilla. No local lymphadenopathy.  Psychiatric: He has a normal mood and affect. His speech is normal and behavior is normal. Thought content normal.      Assessment & Plan:     1. Tick bite of axillary region, left, initial encounter Onset 2 days ago. Removed attached tick from the left axilla and having some itching with weeping rash. No fever or other rashes. Treat with Doxycycline and recheck if no better in 5-7  days. - doxycycline (VIBRA-TABS) 100 MG tablet; Take 1 tablet (100 mg total) by mouth 2 (two) times daily.  Dispense: 20 tablet; Refill: 0  2. Contact dermatitis Onset after working in the yard and removing a tick. May use Benadryl prn itching and Hydrocortisone cream TID. Recheck prn.       Vernie Murders, PA  [facility]

## 2015-08-12 NOTE — Patient Instructions (Signed)
Tick Bite Information Ticks are insects that attach themselves to the skin and draw blood for food. There are various types of ticks. Common types include wood ticks and deer ticks. Most ticks live in shrubs and grassy areas. Ticks can climb onto your body when you make contact with leaves or grass where the tick is waiting. The most common places on the body for ticks to attach themselves are the scalp, neck, armpits, waist, and groin. Most tick bites are harmless, but sometimes ticks carry germs that cause diseases. These germs can be spread to a person during the tick's feeding process. The chance of a disease spreading through a tick bite depends on:   The type of tick.  Time of year.   How long the tick is attached.   Geographic location.  HOW CAN YOU PREVENT TICK BITES? Take these steps to help prevent tick bites when you are outdoors:  Wear protective clothing. Long sleeves and long pants are best.   Wear white clothes so you can see ticks more easily.  Tuck your pant legs into your socks.   If walking on a trail, stay in the middle of the trail to avoid brushing against bushes.  Avoid walking through areas with long grass.  Put insect repellent on all exposed skin and along boot tops, pant legs, and sleeve cuffs.   Check clothing, hair, and skin repeatedly and before going inside.   Brush off any ticks that are not attached.  Take a shower or bath as soon as possible after being outdoors.  WHAT IS THE PROPER WAY TO REMOVE A TICK? Ticks should be removed as soon as possible to help prevent diseases caused by tick bites. 1. If latex gloves are available, put them on before trying to remove a tick.  2. Using fine-point tweezers, grasp the tick as close to the skin as possible. You may also use curved forceps or a tick removal tool. Grasp the tick as close to its head as possible. Avoid grasping the tick on its body. 3. Pull gently with steady upward pressure until  the tick lets go. Do not twist the tick or jerk it suddenly. This may break off the tick's head or mouth parts. 4. Do not squeeze or crush the tick's body. This could force disease-carrying fluids from the tick into your body.  5. After the tick is removed, wash the bite area and your hands with soap and water or other disinfectant such as alcohol. 6. Apply a small amount of antiseptic cream or ointment to the bite site.  7. Wash and disinfect any instruments that were used.  Do not try to remove a tick by applying a hot match, petroleum jelly, or fingernail polish to the tick. These methods do not work and may increase the chances of disease being spread from the tick bite.  WHEN SHOULD YOU SEEK MEDICAL CARE? Contact your health care provider if you are unable to remove a tick from your skin or if a part of the tick breaks off and is stuck in the skin.  After a tick bite, you need to be aware of signs and symptoms that could be related to diseases spread by ticks. Contact your health care provider if you develop any of the following in the days or weeks after the tick bite:  Unexplained fever.  Rash. A circular rash that appears days or weeks after the tick bite may indicate the possibility of Lyme disease. The rash may resemble   a target with a bull's-eye and may occur at a different part of your body than the tick bite.  Redness and swelling in the area of the tick bite.   Tender, swollen lymph glands.   Diarrhea.   Weight loss.   Cough.   Fatigue.   Muscle, joint, or bone pain.   Abdominal pain.   Headache.   Lethargy or a change in your level of consciousness.  Difficulty walking or moving your legs.   Numbness in the legs.   Paralysis.  Shortness of breath.   Confusion.   Repeated vomiting.    This information is not intended to replace advice given to you by your health care provider. Make sure you discuss any questions you have with your health  care provider.   Document Released: 03/05/2000 Document Revised: 03/29/2014 Document Reviewed: 08/16/2012 Elsevier Interactive Patient Education 2016 Elsevier Inc.  

## 2015-09-15 ENCOUNTER — Ambulatory Visit (INDEPENDENT_AMBULATORY_CARE_PROVIDER_SITE_OTHER): Payer: Managed Care, Other (non HMO) | Admitting: Cardiology

## 2015-09-15 ENCOUNTER — Encounter: Payer: Self-pay | Admitting: Cardiology

## 2015-09-15 VITALS — BP 126/83 | HR 62 | Ht 70.0 in | Wt 202.4 lb

## 2015-09-15 DIAGNOSIS — E118 Type 2 diabetes mellitus with unspecified complications: Secondary | ICD-10-CM | POA: Diagnosis not present

## 2015-09-15 DIAGNOSIS — I472 Ventricular tachycardia: Secondary | ICD-10-CM

## 2015-09-15 DIAGNOSIS — I10 Essential (primary) hypertension: Secondary | ICD-10-CM | POA: Diagnosis not present

## 2015-09-15 DIAGNOSIS — I2510 Atherosclerotic heart disease of native coronary artery without angina pectoris: Secondary | ICD-10-CM

## 2015-09-15 DIAGNOSIS — I4729 Other ventricular tachycardia: Secondary | ICD-10-CM

## 2015-09-15 NOTE — Progress Notes (Signed)
[name] Date of Birth: 12-16-1960 Medical Record #[id number]  History of Present Illness: [name] is seen for followup CAD and VT . He has a significant history of coronary disease. He had his first myocardial infarction at age 55. At that time he had stenting of the proximal right coronary with a 4.0 x 13 mm MultiLink stent and the distal vessel with a 3.0 x 8 mm MultiLink stent. In June of 2010 he again had an acute inferior myocardial infarction related to occlusion of the distal right coronary. The distal vessel was stented with a 2.5 x 24 mm Taxus stent and a 2.75 x 20 mm Taxus stent at the crux. He had followup cardiac catheterization in September of 2010 which demonstrated nonobstructive disease and ejection fraction of 55%. His last evaluation with cardiac catheterization in July of 2013 showed nonobstructive disease as well.  He also has a history of symptomatic monomorphic ventricular tachycardia that has been treated with Betapace therapy.   On followup today he states he is doing well from a cardiac standpoint. He had one episode of mild chest tightness with a tingling feeling all over. This was better after belching. Otherwise denies palpitations, chest pain, SOB, or dizziness.    Current Outpatient Prescriptions on File Prior to Visit  Medication Sig Dispense Refill  . amphetamine-dextroamphetamine (ADDERALL) 20 MG tablet Take by mouth.    Marland Kitchen aspirin 81 MG tablet Take by mouth.    Marland Kitchen atorvastatin (LIPITOR) 80 MG tablet Take by mouth.    . colchicine 0.6 MG tablet Take by mouth.    . lamoTRIgine (LAMICTAL) 25 MG tablet Take by mouth.    . levothyroxine (SYNTHROID, LEVOTHROID) 100 MCG tablet Take 1 tablet (100 mcg total) by mouth daily before breakfast. 90 tablet 3  . naproxen (NAPROSYN) 500 MG tablet Take by mouth.    . pioglitazone-metformin (ACTOPLUS MET) 15-850 MG tablet TAKE 1 TABLET TWICE A DAY 180 tablet 2  . ramipril (ALTACE) 2.5 MG capsule Take 1 capsule (2.5 mg  total) by mouth daily. 90 capsule 0  . sertraline (ZOLOFT) 100 MG tablet Take by mouth.    . sotalol (BETAPACE) 120 MG tablet TAKE 1 TABLET TWICE A DAY 180 tablet 0  . temazepam (RESTORIL) 30 MG capsule Take by mouth.    . nitroGLYCERIN (NITROSTAT) 0.4 MG SL tablet Place 1 tablet (0.4 mg total) under the tongue every 5 (five) minutes x 3 doses as needed for chest pain. 25 tablet 6   No current facility-administered medications on file prior to visit.    Allergies  Allergen Reactions  . Crestor [Rosuvastatin Calcium]     ITCHING AND PALPITATIONS  . Penicillins     As a child; took as adult and no reaction.    Past Medical History  Diagnosis Date  . Coronary artery disease   . Ventricular tachycardia (HCC)     Symptomatic ventricular tachycardia  . Old inferior myocardial infarction   . Diabetes mellitus   . Dyslipidemia   . History of tobacco use   . Gout   . Parathyroid adenoma   . Thyroid disease   . Hypertension   . Hypothyroidism   . Sleep apnea     does not use cpap  . Arthritis     Past Surgical History  Procedure Laterality Date  . Cardiac catheterization  11/2008    nonobstructive CAD, left main normal, LAD normal, minor LCx irregularities <10%, mRCA 30% ISR, dRCA 30% narrowing proximal to PDA  and PLB bifurcation; stented segments widely patent, LVEF 55%; severe inferior basal wall hypokinesis  . Other surgical history      Status post removal of parathyroid adenoma  . Cholecystectomy    . Cardiac catheterization  08/2004    Acute inferior MI 2/2 dRCA occlusion s/p 2.5 x 24 mm Taxus stent and a 2.75 x 20 mm Taxus stent at the crux; LVEF 45%, moderate akinesis of the inferior wall  . Cardiac catheterization  2000    pRCA 4.0 x 13 mm MultiLink stent, dRCA 3.0 x 8 mm MultiLink stent  . Parathroid    . Cardiac catheterization  10/06/11    normal left main, LAD, D1-3, LCx and OM1-OM3; 20% prox RCA stenosis, 40-50% ISR mid RCA, 20% ISR distal RCA; LVEF 45-50%; mild  basal inferior wall hypokinesis.  . Left heart catheterization with coronary angiogram N/A 10/06/2011    Procedure: LEFT HEART CATHETERIZATION WITH CORONARY ANGIOGRAM;  Surgeon: Wellington Hampshire, MD;  Location: [place] CATH LAB;  Service: Cardiovascular;  Laterality: N/A;    History  Smoking status  . Former Smoker  . Quit date: 10/04/1997  Smokeless tobacco  . Current User  . Types: Snuff    History  Alcohol Use No    Family History  Problem Relation Age of Onset  . Heart attack Father   . Stroke Father   . Alzheimer's disease Father   . Diabetes Mother     Review of Systems: As noted in history of present illness.  All other systems were reviewed and are negative.  Physical Exam: BP 126/83 mmHg  Pulse 62  Ht 5' 10"  (1.778 m)  Wt 202 lb 6.4 oz (91.808 kg)  BMI 29.04 kg/m2 He is a WD white male in no acute distress.  The HEENT exam is normal. The carotids are 2+ without bruits.  There is no thyromegaly.  There is no JVD.  The lungs are clear.   The heart exam reveals a regular rate with a normal S1 and S2.  There are no murmurs, gallops, or rubs.  The PMI is not displaced.   Abdominal exam reveals good bowel sounds.    There is no hepatosplenomegaly or tenderness.  There are no masses.  Exam of the legs reveal no clubbing, cyanosis, or edema.  The legs are without rashes.  The distal pulses are intact.  Cranial nerves II - XII are intact.  Motor and sensory functions are intact.  The gait is normal.  LABORATORY DATA: Ecg today shows NSR with PVC. Old inferior infarct. LAD. QTc is normal at 390 msec. I have personally reviewed and interpreted this study.  Lab Results  Component Value Date   WBC 7.9 02/12/2015   HGB 13.6 10/06/2011   HCT 45.7 02/12/2015   PLT 234 02/12/2015   GLUCOSE 200* 02/12/2015   CHOL 111 02/12/2015   TRIG 117 02/12/2015   HDL 31* 02/12/2015   LDLCALC 57 02/12/2015   ALT 19 02/12/2015   AST 20 02/12/2015   NA 140 02/12/2015   K 4.8 02/12/2015   CL  97 02/12/2015   CREATININE 0.84 02/12/2015   BUN 19 02/12/2015   CO2 26 02/12/2015   TSH 12.450* 02/12/2015   INR 1.10 10/05/2011   HGBA1C 8.7* 02/12/2015     Assessment / Plan: 1. Coronary disease with multiple interventions as noted in history of present illness. Last cath in 2013 showed nonobstructive disease. Patient is having no significant anginal symptoms. Continue aspirin therapy.  2.  Ventricular tachycardia. This is well controlled with sotalol therapy. QTC is normal. He is asymptomatic  3. Dyslipidemia. Continue Lipitor high dose. Controlled.   4. Diabetes mellitus type 2. Poorly controlled. Rx per primary care.   5. Hypothyroidism. On replacement therapy.  6. Anxiety/depression.  Encourage more aerobic exercise. I'll followup again in one year.Marland Kitchen

## 2015-09-15 NOTE — Patient Instructions (Signed)
Continue your current therapy  I will see you in one year   

## 2015-09-18 ENCOUNTER — Other Ambulatory Visit: Payer: Self-pay | Admitting: Cardiology

## 2015-09-18 NOTE — Telephone Encounter (Signed)
Rx(s) sent to pharmacy electronically.  

## 2015-09-27 ENCOUNTER — Other Ambulatory Visit: Payer: Self-pay | Admitting: Family Medicine

## 2015-10-02 ENCOUNTER — Ambulatory Visit (INDEPENDENT_AMBULATORY_CARE_PROVIDER_SITE_OTHER): Payer: Managed Care, Other (non HMO) | Admitting: Family Medicine

## 2015-10-02 VITALS — BP 118/62 | HR 62 | Resp 16 | Wt 204.0 lb

## 2015-10-02 DIAGNOSIS — F988 Other specified behavioral and emotional disorders with onset usually occurring in childhood and adolescence: Secondary | ICD-10-CM

## 2015-10-02 DIAGNOSIS — E119 Type 2 diabetes mellitus without complications: Secondary | ICD-10-CM | POA: Diagnosis not present

## 2015-10-02 DIAGNOSIS — F909 Attention-deficit hyperactivity disorder, unspecified type: Secondary | ICD-10-CM

## 2015-10-02 DIAGNOSIS — I10 Essential (primary) hypertension: Secondary | ICD-10-CM

## 2015-10-02 DIAGNOSIS — M25561 Pain in right knee: Secondary | ICD-10-CM | POA: Diagnosis not present

## 2015-10-02 DIAGNOSIS — H9313 Tinnitus, bilateral: Secondary | ICD-10-CM | POA: Diagnosis not present

## 2015-10-02 LAB — POCT GLYCOSYLATED HEMOGLOBIN (HGB A1C): HEMOGLOBIN A1C: 8.4

## 2015-10-02 MED ORDER — NAPROXEN 500 MG PO TABS: 500.0000 mg | ORAL_TABLET | Freq: Two times a day (BID) | ORAL | Status: DC

## 2015-10-02 NOTE — Progress Notes (Addendum)
Subjective:  HPI  Patient is here for follow up.  Patient does not check b/p. BP Readings from Last 3 Encounters:  10/02/15 118/62  09/15/15 126/83  08/12/15 132/60   Diabetes: Patient checks his sugar fasting some days, readings are usually around 130, no hypoglycemic episodes. Lab Results  Component Value Date   HGBA1C 8.7* 02/12/2015   Insomnia: Takes Temazepam at bedtime and symptoms are controlled.  ADD: Takes Adderall 20 mg 1 tablet twice daily usually and occasionally will take on the weekend also.  His right knee is bothering him again, this has been going on for 4 to 5 years off and on. He did not injury yet that he can remember, we have discussed this before and he was told probably from playing sports when he was younger.  Prior to Admission medications   Medication Sig Start Date End Date Taking? Authorizing Provider  amphetamine-dextroamphetamine (ADDERALL) 20 MG tablet Take by mouth. 12/26/12  Yes Historical Provider, MD  aspirin 81 MG tablet Take by mouth. 12/26/12  Yes Historical Provider, MD  atorvastatin (LIPITOR) 80 MG tablet Take 1 tablet (80 mg total) by mouth daily at 6 PM. 09/18/15  Yes [name] [name], MD  colchicine 0.6 MG tablet Take by mouth. 05/22/14  Yes Historical Provider, MD  lamoTRIgine (LAMICTAL) 25 MG tablet Take by mouth.   Yes Historical Provider, MD  levothyroxine (SYNTHROID, LEVOTHROID) 100 MCG tablet Take 1 tablet (100 mcg total) by mouth daily before breakfast. 02/17/15  Yes [name], MD  naproxen (NAPROSYN) 500 MG tablet Take by mouth. 05/22/14  Yes Historical Provider, MD  pioglitazone-metformin (ACTOPLUS MET) 15-850 MG tablet TAKE 1 TABLET TWICE A DAY 09/29/15  Yes [name], MD  ramipril (ALTACE) 2.5 MG capsule TAKE 1 CAPSULE DAILY 09/18/15  Yes [name] [name], MD  sertraline (ZOLOFT) 100 MG tablet Take by mouth. 12/26/12  Yes Historical Provider, MD  sotalol (BETAPACE) 120 MG tablet TAKE 1 TABLET TWICE A DAY 09/18/15   Yes [name] [name], MD  temazepam (RESTORIL) 30 MG capsule Take by mouth. 05/01/13  Yes Historical Provider, MD  nitroGLYCERIN (NITROSTAT) 0.4 MG SL tablet Place 1 tablet (0.4 mg total) under the tongue every 5 (five) minutes x 3 doses as needed for chest pain. 07/08/14 07/08/15  [name] [name], MD    Patient Active Problem List   Diagnosis Date Noted  . Gout 10/21/2014  . 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 ADD (attention deficit disorder) 08/26/2014  . Multiple blisters 08/26/2014  . Clinical depression 08/26/2014  . Diabetes mellitus, type 2 ([place]) 08/26/2014  . Essential (primary) hypertension 08/26/2014  . Anxiety, generalized 08/26/2014  . High potassium 08/26/2014  . HPTH (hyperparathyroidism) ([place]) 08/26/2014  . BP (high blood pressure) 08/26/2014  . Bug bite with infection 08/26/2014  . Injury, shoulder and upper arm 08/26/2014  . Cannot sleep 08/26/2014  . Calculus of kidney 08/26/2014  . Depression, major, recurrent, mild ([place]) 08/26/2014  . Bursitis of elbow 08/26/2014  . Affective bipolar disorder ([place]) 08/26/2014  . Adult hypothyroidism 08/26/2014  . Chest pain 10/06/2011  . Type 2 diabetes mellitus ([place]) 10/05/2011  . Hypothyroidism 10/05/2011  . Anxiety 10/05/2011  . CAD (coronary artery disease) 06/29/2011  . Old MI (myocardial infarction) 06/29/2011  . Hyperlipidemia 06/29/2011  . Ventricular tachycardia, monomorphic ([place]) 06/29/2011    Past Medical History  Diagnosis Date  . Coronary artery disease   . Ventricular tachycardia (HCC)     Symptomatic  ventricular tachycardia  . Old inferior myocardial infarction   . Diabetes mellitus   . Dyslipidemia   . History of tobacco use   . Gout   . Parathyroid adenoma   . Thyroid disease   . Hypertension   . Hypothyroidism   . Sleep apnea     does not use cpap  . Arthritis     Social History   Social History  . Marital Status: Married    Spouse Name: N/A  . Number of Children: 3  . 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 Smoking status: Former Smoker    Quit date: 10/04/1997  . Smokeless tobacco: Current User    Types: Snuff  . Alcohol Use: No  . Drug Use: No  . Sexual Activity: Yes   Other Topics Concern  . Not on file   Social History Narrative   Lives in [place], [place] with wife.     Allergies  Allergen Reactions  . Crestor [Rosuvastatin Calcium]     ITCHING AND PALPITATIONS  . Penicillins     As a child; took as adult and no reaction.    Review of Systems  Constitutional: Negative.   HENT: Positive for tinnitus.   Eyes: Negative.   Respiratory: Negative.   Cardiovascular: Negative.   Gastrointestinal: Negative.   Musculoskeletal: Positive for joint pain.  Neurological: Negative.   Endo/Heme/Allergies: Negative.   Psychiatric/Behavioral: Negative.     Immunization History  Administered Date(s) Administered  . Tdap 12/17/2010   Objective:  BP 118/62 mmHg  Pulse 62  Resp 16  Wt 204 lb (92.534 kg)  Physical Exam  Constitutional: He is oriented to person, place, and time and well-developed, well-nourished, and in no distress.  HENT:  Head: Normocephalic and atraumatic.  Right Ear: External ear normal.  Left Ear: External ear normal.  Nose: Nose normal.  Eyes: Conjunctivae are normal. Pupils are equal, round, and reactive to light.  Neck: Normal range of motion. Neck supple.  Cardiovascular: Normal rate, regular rhythm, normal heart sounds and intact distal pulses.   No murmur heard. Pulmonary/Chest: Effort normal and breath sounds normal. No respiratory distress. He has no wheezes.  Abdominal: Soft. Bowel sounds are normal.  Musculoskeletal:  Tender over the insertion of patellar tendon at the right patella  Neurological: He is alert and oriented to person, place, and time. Gait normal.  Skin: Skin is warm and dry.  Psychiatric: Mood, memory, affect and judgment normal.    Lab Results   Component Value Date   WBC 7.9 02/12/2015   HGB 13.6 10/06/2011   HCT 45.7 02/12/2015   PLT 234 02/12/2015   GLUCOSE 200* 02/12/2015   CHOL 111 02/12/2015   TRIG 117 02/12/2015   HDL 31* 02/12/2015   LDLCALC 57 02/12/2015   TSH 12.450* 02/12/2015   INR 1.10 10/05/2011   HGBA1C 8.7* 02/12/2015    CMP     Component Value Date/Time   NA 140 02/12/2015 1101   NA 142 10/06/2011 0430   K 4.8 02/12/2015 1101   CL 97 02/12/2015 1101   CO2 26 02/12/2015 1101   GLUCOSE 200* 02/12/2015 1101   GLUCOSE 153* 10/06/2011 0430   BUN 19 02/12/2015 1101   BUN 10 10/06/2011 0430   CREATININE 0.84 02/12/2015 1101   CREATININE 0.9 10/22/2013   CALCIUM 9.1 02/12/2015 1101   PROT 7.1 02/12/2015 1101   PROT 6.8 12/03/2008 1810  ALBUMIN 4.6 02/12/2015 1101   ALBUMIN 4.1 12/03/2008 1810   AST 20 02/12/2015 1101   ALT 19 02/12/2015 1101   ALKPHOS 104 02/12/2015 1101   BILITOT 0.4 02/12/2015 1101   BILITOT 0.9 12/03/2008 1810   GFRNONAA 98 02/12/2015 1101   GFRAA 113 02/12/2015 1101    Assessment and Plan :  1. Type 2 diabetes mellitus without complication, without long-term current use of insulin (HCC) A1C 8.4 better. Continue current regimen. Follow. - POCT HgB A1C--8.4   2. Essential hypertension Stable.  3. Right knee pain Patellar tendon. Try Naproxen. Will get Xray. If symptoms are not better will refer to ortho (Dr. Sabra Heck and Dr. Trudee Grip that point. - naproxen (NAPROSYN) 500 MG tablet; Take 1 tablet (500 mg total) by mouth 2 (two) times daily with a meal.  Dispense: 60 tablet; Refill: 5 - DG Knee Complete 4 Views Right; Future - Ambulatory referral to Orthopedic Surgery  4. ADD Stable on Adderall-sees Dr. Nicolasa Ducking  5. Tinnitus bilateral Not an ear wax issue per exam today. No dizziness. Discussed with patient that referral to ENT can be arranged at any time. Will follow. 6. CAD All risk factors treated. 7. Right patellar tendinitis Treat and plan to refer to  orthopedics if it does not respond to nonsteroidals. Patient All riskwas seen and examined by Dr. Eulas Post and note was scribed by Theressa Millard, RMA.   [name] MD [facility] 10/02/2015 4:00 PM

## 2015-12-13 ENCOUNTER — Encounter: Payer: Self-pay | Admitting: Family Medicine

## 2015-12-13 ENCOUNTER — Ambulatory Visit (INDEPENDENT_AMBULATORY_CARE_PROVIDER_SITE_OTHER): Payer: Managed Care, Other (non HMO) | Admitting: Family Medicine

## 2015-12-13 VITALS — BP 122/78 | HR 60 | Temp 97.9°F | Resp 16 | Wt 207.0 lb

## 2015-12-13 DIAGNOSIS — W57XXXA Bitten or stung by nonvenomous insect and other nonvenomous arthropods, initial encounter: Principal | ICD-10-CM

## 2015-12-13 DIAGNOSIS — L089 Local infection of the skin and subcutaneous tissue, unspecified: Secondary | ICD-10-CM

## 2015-12-13 DIAGNOSIS — S90465A Insect bite (nonvenomous), left lesser toe(s), initial encounter: Secondary | ICD-10-CM | POA: Diagnosis not present

## 2015-12-13 MED ORDER — DOXYCYCLINE HYCLATE 100 MG PO TABS: 100.0000 mg | ORAL_TABLET | Freq: Two times a day (BID) | ORAL | 0 refills | Status: DC

## 2015-12-13 NOTE — Progress Notes (Signed)
Patient: [name] Male    DOB: September 23, 1960   55 y.o.   MRN: [id number] Visit Date: 12/13/2015  Today's Provider: Vernie Murders, PA   Chief Complaint  Patient presents with  . Toe Injury   Subjective:    HPI  Patient states that he went to his truck barefoot Thursday September 21st, he does not recall stepping on anything or being bitten. That night he woke up with itching around left big toe and second toe. Friday he woke up and his toes were red and swollen and there were 2 blisters which he popped. Clear fluid came out. Swelling , redness and itching still present. No real pain. No fever or chills. He has applied cortisone cream around the affected area and used antibiotic cream.   Past Medical History:  Diagnosis Date  . Arthritis   . Coronary artery disease   . Diabetes mellitus   . Dyslipidemia   . Gout   . History of tobacco use   . Hypertension   . Hypothyroidism   . Old inferior myocardial infarction   . Parathyroid adenoma   . Sleep apnea    does not use cpap  . Thyroid disease   . Ventricular tachycardia (HCC)    Symptomatic ventricular tachycardia   Past Surgical History:  Procedure Laterality Date  . CARDIAC CATHETERIZATION  11/2008   nonobstructive CAD, left main normal, LAD normal, minor LCx irregularities <10%, mRCA 30% ISR, dRCA 30% narrowing proximal to PDA and PLB bifurcation; stented segments widely patent, LVEF 55%; severe inferior basal wall hypokinesis  . CARDIAC CATHETERIZATION  08/2004   Acute inferior MI 2/2 dRCA occlusion s/p 2.5 x 24 mm Taxus stent and a 2.75 x 20 mm Taxus stent at the crux; LVEF 45%, moderate akinesis of the inferior wall  . CARDIAC CATHETERIZATION  2000   pRCA 4.0 x 13 mm MultiLink stent, dRCA 3.0 x 8 mm MultiLink stent  . CARDIAC CATHETERIZATION  10/06/11   normal left main, LAD, D1-3, LCx and OM1-OM3; 20% prox RCA stenosis, 40-50% ISR mid RCA, 20% ISR distal RCA; LVEF 45-50%; mild basal inferior wall  hypokinesis.  . CHOLECYSTECTOMY    . LEFT HEART CATHETERIZATION WITH CORONARY ANGIOGRAM N/A 10/06/2011   Procedure: LEFT HEART CATHETERIZATION WITH CORONARY ANGIOGRAM;  Surgeon: Wellington Hampshire, MD;  Location: [facility];  Service: Cardiovascular;  Laterality: N/A;  . OTHER SURGICAL HISTORY     Status post removal of parathyroid adenoma  . parathroid      Allergies  Allergen Reactions  . Crestor [Rosuvastatin Calcium]     ITCHING AND PALPITATIONS  . Penicillins     As a child; took as adult and no reaction.     Current Outpatient Prescriptions:  .  amphetamine-dextroamphetamine (ADDERALL) 20 MG tablet, Take by mouth., Disp: , Rfl:  .  aspirin 81 MG tablet, Take by mouth., Disp: , Rfl:  .  atorvastatin (LIPITOR) 80 MG tablet, Take 1 tablet (80 mg total) by mouth daily at 6 PM., Disp: 90 tablet, Rfl: 3 .  colchicine 0.6 MG tablet, Take by mouth., Disp: , Rfl:  .  lamoTRIgine (LAMICTAL) 25 MG tablet, Take by mouth., Disp: , Rfl:  .  levothyroxine (SYNTHROID, LEVOTHROID) 100 MCG tablet, Take 1 tablet (100 mcg total) by mouth daily before breakfast., Disp: 90 tablet, Rfl: 3 .  naproxen (NAPROSYN) 500 MG tablet, Take 1 tablet (500 mg total) by mouth 2 (two) times daily with a meal.,  Disp: 60 tablet, Rfl: 5 .  pioglitazone-metformin (ACTOPLUS MET) 15-850 MG tablet, TAKE 1 TABLET TWICE A DAY, Disp: 180 tablet, Rfl: 3 .  ramipril (ALTACE) 2.5 MG capsule, TAKE 1 CAPSULE DAILY, Disp: 90 capsule, Rfl: 3 .  sertraline (ZOLOFT) 100 MG tablet, Take by mouth., Disp: , Rfl:  .  sotalol (BETAPACE) 120 MG tablet, TAKE 1 TABLET TWICE A DAY, Disp: 180 tablet, Rfl: 3 .  temazepam (RESTORIL) 30 MG capsule, Take by mouth., Disp: , Rfl:  .  nitroGLYCERIN (NITROSTAT) 0.4 MG SL tablet, Place 1 tablet (0.4 mg total) under the tongue every 5 (five) minutes x 3 doses as needed for chest pain., Disp: 25 tablet, Rfl: 6  Review of Systems  Constitutional: Negative.   Respiratory: Negative.   Cardiovascular:  Negative.   Musculoskeletal: Positive for joint swelling.       Redness in the toes on the left    Social History  Substance Use Topics  . Smoking status: Former Smoker    Quit date: 10/04/1997  . Smokeless tobacco: Current User    Types: Snuff  . Alcohol use No   Objective:   BP 122/78   Pulse 60   Temp 97.9 F (36.6 C)   Resp 16   Wt 207 lb (93.9 kg)   BMI 29.70 kg/m   Physical Exam  Constitutional: He is oriented to person, place, and time. He appears well-developed and well-nourished. No distress.  HENT:  Head: Normocephalic and atraumatic.  Right Ear: Hearing normal.  Left Ear: Hearing normal.  Nose: Nose normal.  Eyes: Conjunctivae and lids are normal. Right eye exhibits no discharge. Left eye exhibits no discharge. No scleral icterus.  Pulmonary/Chest: Effort normal. No respiratory distress.  Musculoskeletal: Normal range of motion.  Neurological: He is alert and oriented to person, place, and time.  Skin: Skin is intact. No lesion noted. There is erythema.  Redness and some swelling of the left first and second toes. Open blisters at the base of each toe at the web between them.  Psychiatric: He has a normal mood and affect. His speech is normal and behavior is normal. Thought content normal.      Assessment & Plan:     1. Infected insect bite of toe of left foot, initial encounter Onset 3 days ago. Too dark to see any insects and no immediate pain. May continue Band-Aid covering when wearing shoes. Recommend antibiotic and hot Epsom saltwater soaks each evening. Recheck if any worsening in 5 days. - doxycycline (VIBRA-TABS) 100 MG tablet; Take 1 tablet (100 mg total) by mouth 2 (two) times daily.  Dispense: 20 tablet; Refill: [place], PA  [facility]

## 2015-12-13 NOTE — Patient Instructions (Signed)
Insect Bite Mosquitoes, flies, fleas, bedbugs, and many other insects can bite. Insect bites are different from insect stings. A sting is when poison (venom) is injected into the skin. Insect bites can cause pain or itching for a few days, but they are usually not serious. Some insects can spread diseases to people through a bite. SYMPTOMS  Symptoms of an insect bite include:  Itching or pain in the bite area.  Redness and swelling in the bite area.  An open wound (skin ulcer). In many cases, symptoms last for 2-4 days.  DIAGNOSIS  This condition is usually diagnosed based on symptoms and a physical exam. TREATMENT  Treatment is usually not needed for an insect bite. Symptoms often go away on their own. Your health care provider may recommend creams or lotions to help reduce itching. Antibiotic medicines may be prescribed if the bite becomes infected. A tetanus shot may be given in some cases. If you develop an allergic reaction to an insect bite, your health care provider will prescribe medicines to treat the reaction (antihistamines). This is rare. HOME CARE INSTRUCTIONS  Do not scratch the bite area.  Keep the bite area clean and dry. Wash the bite area daily with soap and water as told by your health care provider.  If directed, applyice to the bite area.  Put ice in a plastic bag.  Place a towel between your skin and the bag.  Leave the ice on for 20 minutes, 2-3 times per day.  To help reduce itching and swelling, try applying a baking soda paste, cortisone cream, or calamine lotion to the bite area as told by your health care provider.  Apply or take over-the-counter and prescription medicines only as told by your health care provider.  If you were prescribed an antibiotic medicine, use it as told by your health care provider. Do not stop using the antibiotic even if your condition improves.  Keep all follow-up visits as told by your health care provider. This is  important. PREVENTION   Use insect repellent. The best insect repellents contain:  DEET, picaridin, oil of lemon eucalyptus (OLE), or IR3535.  Higher amounts of an active ingredient.  When you are outdoors, wear clothing that covers your arms and legs.  Avoid opening windows that do not have window screens. SEEK MEDICAL CARE IF:  You have increased redness, swelling, or pain in the bite area.  You have a fever. SEEK IMMEDIATE MEDICAL CARE IF:   You have joint pain.   You have fluid, blood, or pus coming from the bite area.  You have a headache or neck pain.  You have unusual weakness.  You have a rash.  You have chest pain or shortness of breath.  You have abdominal pain, nausea, or vomiting.  You feel unusually tired or sleepy.   This information is not intended to replace advice given to you by your health care provider. Make sure you discuss any questions you have with your health care provider.   Document Released: 04/15/2004 Document Revised: 11/27/2014 Document Reviewed: 07/24/2014 Elsevier Interactive Patient Education 2016 Elsevier Inc.  

## 2016-01-27 ENCOUNTER — Other Ambulatory Visit: Payer: Self-pay | Admitting: Family Medicine

## 2016-01-27 DIAGNOSIS — E039 Hypothyroidism, unspecified: Secondary | ICD-10-CM

## 2016-02-03 ENCOUNTER — Ambulatory Visit (INDEPENDENT_AMBULATORY_CARE_PROVIDER_SITE_OTHER): Payer: Managed Care, Other (non HMO) | Admitting: Family Medicine

## 2016-02-03 VITALS — BP 134/72 | HR 84 | Temp 98.9°F | Resp 16 | Wt 209.0 lb

## 2016-02-03 DIAGNOSIS — I10 Essential (primary) hypertension: Secondary | ICD-10-CM

## 2016-02-03 DIAGNOSIS — E119 Type 2 diabetes mellitus without complications: Secondary | ICD-10-CM

## 2016-02-03 DIAGNOSIS — F988 Other specified behavioral and emotional disorders with onset usually occurring in childhood and adolescence: Secondary | ICD-10-CM

## 2016-02-03 DIAGNOSIS — Z23 Encounter for immunization: Secondary | ICD-10-CM | POA: Diagnosis not present

## 2016-02-03 DIAGNOSIS — M109 Gout, unspecified: Secondary | ICD-10-CM | POA: Diagnosis not present

## 2016-02-03 DIAGNOSIS — E784 Other hyperlipidemia: Secondary | ICD-10-CM

## 2016-02-03 DIAGNOSIS — E7849 Other hyperlipidemia: Secondary | ICD-10-CM

## 2016-02-03 LAB — POCT GLYCOSYLATED HEMOGLOBIN (HGB A1C): HEMOGLOBIN A1C: 8.1

## 2016-02-03 NOTE — Progress Notes (Signed)
[name]  MRN: [id number] DOB: 04-Jan-1961  Subjective:  HPI   Patient is here for follow up. Patient checks his sugar off and on and fasting readings have been around 130. No hypoglycemic episodes. No numbness or tingling sensation. Lab Results  Component Value Date   HGBA1C 8.4 10/02/2015   He just got b/p machine and started to check his b/p and readings have been around 130/80-85, last reading was 110/72. No cardiac symptoms. BP Readings from Last 3 Encounters:  02/03/16 134/72  12/13/15 122/78  10/02/15 118/62   He has been seen Dr Nicolasa Ducking for his Adderall but she said that patient did not need to really see her anymore for that and that we can take over. He is stable emotionally. He saw her last in September and has appointment coming up.  Cardiac status stable.  Patient Active Problem List   Diagnosis Date Noted  . Gout 10/21/2014  . 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 ADD (attention deficit disorder) 08/26/2014  . Multiple blisters 08/26/2014  . Clinical depression 08/26/2014  . Diabetes mellitus, type 2 ([place]) 08/26/2014  . Essential (primary) hypertension 08/26/2014  . Anxiety, generalized 08/26/2014  . High potassium 08/26/2014  . HPTH (hyperparathyroidism) ([place]) 08/26/2014  . BP (high blood pressure) 08/26/2014  . Bug bite with infection 08/26/2014  . Injury, shoulder and upper arm 08/26/2014  . Cannot sleep 08/26/2014  . Calculus of kidney 08/26/2014  . Depression, major, recurrent, mild ([place]) 08/26/2014  . Bursitis of elbow 08/26/2014  . Affective bipolar disorder ([place]) 08/26/2014  . Adult hypothyroidism 08/26/2014  . Chest pain 10/06/2011  . Type 2 diabetes mellitus ([place]) 10/05/2011  . Hypothyroidism 10/05/2011  . Anxiety 10/05/2011  . CAD (coronary artery disease) 06/29/2011  . Old MI (myocardial infarction) 06/29/2011  . Hyperlipidemia 06/29/2011  . 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  Arthritis   . Coronary artery disease   . Diabetes mellitus   . Dyslipidemia   . Gout   . History of tobacco use   . Hypertension   . Hypothyroidism   . Old inferior myocardial infarction   . Parathyroid adenoma   . Sleep apnea    does not use cpap  . Thyroid disease   . Ventricular tachycardia (HCC)    Symptomatic ventricular tachycardia    Social History   Social History  . Marital status: Married    Spouse name: N/A  . Number of children: 3  . 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 Smoking status: Former Smoker    Quit date: 10/04/1997  . Smokeless tobacco: Current User    Types: Snuff  . Alcohol use No  . Drug use: No  . Sexual activity: Yes   Other Topics Concern  . Not on file   Social History Narrative   Lives in [place], [place] with wife.     Outpatient Encounter Prescriptions as of 02/03/2016  Medication Sig Note  . amphetamine-dextroamphetamine (ADDERALL) 20 MG tablet Take by mouth. 08/26/2014: for November Received from: [facility]  . aspirin 81 MG tablet Take by mouth. 08/26/2014: Received from: [facility]  . atorvastatin (LIPITOR) 80 MG tablet Take 1 tablet (80 mg total) by mouth daily at 6 PM.   . colchicine 0.6 MG tablet Take by mouth. 08/26/2014: Medication taken as needed.  Received from: [facility]  .  lamoTRIgine (LAMICTAL) 25 MG tablet Take by mouth. 08/26/2014: Received from: [facility]  . levothyroxine (SYNTHROID, LEVOTHROID) 100 MCG tablet TAKE 1 TABLET DAILY BEFORE BREAKFAST   . naproxen (NAPROSYN) 500 MG tablet Take 1 tablet (500 mg total) by mouth 2 (two) times daily with a meal.   . pioglitazone-metformin (ACTOPLUS MET) 15-850 MG tablet TAKE 1 TABLET TWICE A DAY   . ramipril (ALTACE) 2.5 MG capsule TAKE 1 CAPSULE DAILY   . sertraline (ZOLOFT) 100 MG tablet Take by mouth. 08/26/2014: Received from: [facility]  . sotalol (BETAPACE) 120 MG tablet TAKE 1 TABLET TWICE A DAY   . temazepam (RESTORIL) 30 MG capsule Take by mouth. 08/26/2014: Received from: [facility]  . nitroGLYCERIN (NITROSTAT) 0.4 MG SL tablet Place 1 tablet (0.4 mg total) under the tongue every 5 (five) minutes x 3 doses as needed for chest pain.   . [DISCONTINUED] doxycycline (VIBRA-TABS) 100 MG tablet Take 1 tablet (100 mg total) by mouth 2 (two) times daily.    No facility-administered encounter medications on file as of 02/03/2016.     Allergies  Allergen Reactions  . Crestor [Rosuvastatin Calcium]     ITCHING AND PALPITATIONS  . Penicillins     As a child; took as adult and no reaction.    Review of Systems  Constitutional: Negative.   Respiratory: Negative.   Cardiovascular: Negative.   Gastrointestinal: Negative.   Musculoskeletal: Negative.   Neurological: Negative.   Psychiatric/Behavioral: Negative.        Stable on medication    Objective:  BP 134/72   Pulse 84   Temp 98.9 F (37.2 C)   Resp 16   Wt 209 lb (94.8 kg)   BMI 29.99 kg/m   Physical Exam  Constitutional: He is oriented to person, place, and time and well-developed, well-nourished, and in no distress.  HENT:  Head: Normocephalic and atraumatic.  Right Ear: External ear normal.  Left Ear: External ear normal.  Nose: Nose normal.  Eyes: Conjunctivae are normal. Pupils are equal, round, and reactive to light.  Neck: Normal range of motion. Neck supple.  Cardiovascular: Normal rate, regular rhythm, normal heart sounds and intact distal pulses.   No murmur heard. Pulmonary/Chest: Effort normal and breath sounds normal. No respiratory distress. He has no wheezes.  Abdominal: Soft.  Musculoskeletal: He exhibits no edema or tenderness.  Neurological: He is alert and oriented to person, place, and time.  Skin: Skin is warm and dry.  Psychiatric: Mood, memory, affect and judgment normal.    Assessment and  Plan :  1. Type 2 diabetes mellitus without complication, without long-term current use of insulin (HCC) A1C 8.1 today. Better. Continue medications and working on habits.  2. Essential hypertension Stable.  3. Attention deficit disorder, unspecified hyperactivity presence Seen Dr Maryruth Bun. Advised patient that we can take over and start writing Adderall but will not be able to do this electronically will have to a be a hard copy. Patient will see Dr Maryruth Bun in December and will let us know what he decides.  4. Other hyperlipidemia Check labs today.  5. Gout, unspecified cause, unspecified chronicity, unspecified site 6.CAD All issues treated.  HPI, Exam and A&P transcribed under direction and in the presence of Julieanne Manson, MD. I have done the exam and reviewed the chart and it is accurate to the best of my knowledge. Dentist has been used and  any errors in dictation or transcription are unintentional. Gerlene Burdock  Rosanna Randy M.D. [facility]

## 2016-02-25 ENCOUNTER — Telehealth: Payer: Self-pay

## 2016-02-25 LAB — COMPREHENSIVE METABOLIC PANEL
A/G RATIO: 1.7 (ref 1.2–2.2)
ALBUMIN: 4.4 g/dL (ref 3.5–5.5)
ALK PHOS: 96 IU/L (ref 39–117)
ALT: 15 IU/L (ref 0–44)
AST: 15 IU/L (ref 0–40)
BUN / CREAT RATIO: 17 (ref 9–20)
BUN: 17 mg/dL (ref 6–24)
Bilirubin Total: 0.6 mg/dL (ref 0.0–1.2)
CO2: 25 mmol/L (ref 18–29)
CREATININE: 0.99 mg/dL (ref 0.76–1.27)
Calcium: 9.3 mg/dL (ref 8.7–10.2)
Chloride: 98 mmol/L (ref 96–106)
GFR calc Af Amer: 97 mL/min/{1.73_m2} (ref >59)
GFR, EST NON AFRICAN AMERICAN: 84 mL/min/{1.73_m2} (ref >59)
GLOBULIN, TOTAL: 2.6 g/dL (ref 1.5–4.5)
Glucose: 264 mg/dL — ABNORMAL HIGH (ref 65–99)
POTASSIUM: 5.3 mmol/L — AB (ref 3.5–5.2)
SODIUM: 140 mmol/L (ref 134–144)
Total Protein: 7 g/dL (ref 6.0–8.5)

## 2016-02-25 LAB — CBC WITH DIFFERENTIAL/PLATELET
BASOS ABS: 0 10*3/uL (ref 0.0–0.2)
BASOS: 0 %
EOS (ABSOLUTE): 0.4 10*3/uL (ref 0.0–0.4)
EOS: 4 %
HEMOGLOBIN: 14.9 g/dL (ref 13.0–17.7)
Hematocrit: 44.2 % (ref 37.5–51.0)
IMMATURE GRANS (ABS): 0 10*3/uL (ref 0.0–0.1)
IMMATURE GRANULOCYTES: 0 %
LYMPHS ABS: 2 10*3/uL (ref 0.7–3.1)
Lymphs: 19 %
MCH: 32.6 pg (ref 26.6–33.0)
MCHC: 33.7 g/dL (ref 31.5–35.7)
MCV: 97 fL (ref 79–97)
MONOS ABS: 0.8 10*3/uL (ref 0.1–0.9)
Monocytes: 8 %
NEUTROS ABS: 7.2 10*3/uL — AB (ref 1.4–7.0)
Neutrophils: 69 %
PLATELETS: 235 10*3/uL (ref 150–379)
RBC: 4.57 x10E6/uL (ref 4.14–5.80)
RDW: 13 % (ref 12.3–15.4)
WBC: 10.5 10*3/uL (ref 3.4–10.8)

## 2016-02-25 LAB — TSH: TSH: 15.8 u[IU]/mL — ABNORMAL HIGH (ref 0.450–4.500)

## 2016-02-25 LAB — LIPID PANEL WITH LDL/HDL RATIO
CHOLESTEROL TOTAL: 114 mg/dL (ref 100–199)
HDL: 34 mg/dL — ABNORMAL LOW (ref >39)
LDL CALC: 50 mg/dL (ref 0–99)
LDl/HDL Ratio: 1.5 ratio units (ref 0.0–3.6)
TRIGLYCERIDES: 152 mg/dL — AB (ref 0–149)
VLDL CHOLESTEROL CAL: 30 mg/dL (ref 5–40)

## 2016-02-25 MED ORDER — LEVOTHYROXINE SODIUM 150 MCG PO TABS: 150.0000 ug | ORAL_TABLET | Freq: Every day | ORAL | 3 refills | Status: DC

## 2016-02-25 NOTE — Telephone Encounter (Signed)
Patient advised. New RX for 150 mcg sent to [facility].

## 2016-02-25 NOTE — Telephone Encounter (Signed)
-----   Message from [name], MD sent at 02/25/2016  7:36 AM EST ----- Labs stable. Increase synthroid from 100 to 131mcg daily.

## 2016-04-26 ENCOUNTER — Other Ambulatory Visit: Payer: Self-pay | Admitting: Family Medicine

## 2016-04-26 DIAGNOSIS — E039 Hypothyroidism, unspecified: Secondary | ICD-10-CM

## 2016-05-07 ENCOUNTER — Encounter: Payer: Self-pay | Admitting: Family Medicine

## 2016-05-07 ENCOUNTER — Ambulatory Visit (INDEPENDENT_AMBULATORY_CARE_PROVIDER_SITE_OTHER): Payer: Managed Care, Other (non HMO) | Admitting: Family Medicine

## 2016-05-07 VITALS — BP 144/82 | HR 71 | Temp 98.0°F | Resp 16 | Wt 208.8 lb

## 2016-05-07 DIAGNOSIS — H60552 Acute reactive otitis externa, left ear: Secondary | ICD-10-CM | POA: Diagnosis not present

## 2016-05-07 MED ORDER — NEOMYCIN-POLYMYXIN-HC 3.5-10000-1 OT SOLN: 3.0000 [drp] | Freq: Four times a day (QID) | OTIC | 0 refills | Status: DC

## 2016-05-07 NOTE — Progress Notes (Signed)
Patient: [name] Male    DOB: [birth year]   56 y.o.   MRN: [id number] Visit Date: 05/07/2016  Today's Provider: Vernie Murders, PA   Chief Complaint  Patient presents with  . Ear Pain   Subjective:    Otalgia   There is pain in the left ear. This is a new problem. Episode onset: Wednesday. The problem occurs constantly. The problem has been unchanged. There has been no fever.  Patient reports attempting to clean ears with peroxide and was using a tissue to get the excess water out when the tissue broke over in the left ear. Patient thought he might (but did not) try baking soda and vinegar to see if it would push any remaining tissue paper out.   Patient Active Problem List   Diagnosis Date Noted  . Gout 10/21/2014  . 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 ADD (attention deficit disorder) 08/26/2014  . Multiple blisters 08/26/2014  . Clinical depression 08/26/2014  . Diabetes mellitus, type 2 ([place]) 08/26/2014  . Essential (primary) hypertension 08/26/2014  . Anxiety, generalized 08/26/2014  . High potassium 08/26/2014  . HPTH (hyperparathyroidism) ([place]) 08/26/2014  . BP (high blood pressure) 08/26/2014  . Bug bite with infection 08/26/2014  . Injury, shoulder and upper arm 08/26/2014  . Cannot sleep 08/26/2014  . Calculus of kidney 08/26/2014  . Depression, major, recurrent, mild ([place]) 08/26/2014  . Bursitis of elbow 08/26/2014  . Affective bipolar disorder ([place]) 08/26/2014  . Adult hypothyroidism 08/26/2014  . Chest pain 10/06/2011  . Type 2 diabetes mellitus ([place]) 10/05/2011  . Hypothyroidism 10/05/2011  . Anxiety 10/05/2011  . CAD (coronary artery disease) 06/29/2011  . Old MI (myocardial infarction) 06/29/2011  . Hyperlipidemia 06/29/2011  . Ventricular tachycardia, monomorphic ([place]) 06/29/2011   Past Surgical History:  Procedure Laterality Date  . CARDIAC CATHETERIZATION  11/2008   nonobstructive CAD, left main normal, LAD normal, minor LCx irregularities <10%,  mRCA 30% ISR, dRCA 30% narrowing proximal to PDA and PLB bifurcation; stented segments widely patent, LVEF 55%; severe inferior basal wall hypokinesis  . CARDIAC CATHETERIZATION  08/2004   Acute inferior MI 2/2 dRCA occlusion s/p 2.5 x 24 mm Taxus stent and a 2.75 x 20 mm Taxus stent at the crux; LVEF 45%, moderate akinesis of the inferior wall  . CARDIAC CATHETERIZATION  2000   pRCA 4.0 x 13 mm MultiLink stent, dRCA 3.0 x 8 mm MultiLink stent  . CARDIAC CATHETERIZATION  10/06/11   normal left main, LAD, D1-3, LCx and OM1-OM3; 20% prox RCA stenosis, 40-50% ISR mid RCA, 20% ISR distal RCA; LVEF 45-50%; mild basal inferior wall hypokinesis.  . CHOLECYSTECTOMY    . LEFT HEART CATHETERIZATION WITH CORONARY ANGIOGRAM N/A 10/06/2011   Procedure: LEFT HEART CATHETERIZATION WITH CORONARY ANGIOGRAM;  Surgeon: Wellington Hampshire, MD;  Location: [facility];  Service: Cardiovascular;  Laterality: N/A;  . OTHER SURGICAL HISTORY     Status post removal of parathyroid adenoma  . parathroid     Family History  Problem Relation Age of Onset  . Heart attack Father   . Stroke Father   . Alzheimer's disease Father   . Diabetes Mother    Allergies  Allergen Reactions  . Crestor [Rosuvastatin Calcium]     ITCHING AND PALPITATIONS  . Penicillins     As a child; took as adult and no reaction.     Previous Medications   AMPHETAMINE-DEXTROAMPHETAMINE (ADDERALL) 20 MG TABLET    Take by mouth.   ASPIRIN  81 MG TABLET    Take by mouth.   ATORVASTATIN (LIPITOR) 80 MG TABLET    Take 1 tablet (80 mg total) by mouth daily at 6 PM.   COLCHICINE 0.6 MG TABLET    Take by mouth.   LAMOTRIGINE (LAMICTAL) 25 MG TABLET    Take by mouth.   LEVOTHYROXINE (SYNTHROID, LEVOTHROID) 150 MCG TABLET    Take 1 tablet (150 mcg total) by mouth daily before breakfast.   NAPROXEN (NAPROSYN) 500 MG TABLET    Take 1 tablet (500 mg total) by mouth 2 (two) times daily with a meal.   NITROGLYCERIN (NITROSTAT) 0.4 MG SL TABLET    Place 1  tablet (0.4 mg total) under the tongue every 5 (five) minutes x 3 doses as needed for chest pain.   PIOGLITAZONE-METFORMIN (ACTOPLUS MET) 15-850 MG TABLET    TAKE 1 TABLET TWICE A DAY   RAMIPRIL (ALTACE) 2.5 MG CAPSULE    TAKE 1 CAPSULE DAILY   SERTRALINE (ZOLOFT) 100 MG TABLET    Take by mouth.   SOTALOL (BETAPACE) 120 MG TABLET    TAKE 1 TABLET TWICE A DAY   TEMAZEPAM (RESTORIL) 30 MG CAPSULE    Take by mouth.    Review of Systems  Constitutional: Negative.   HENT: Positive for ear pain.   Respiratory: Negative.   Cardiovascular: Negative.     Social History  Substance Use Topics  . Smoking status: Former Smoker    Quit date: 10/04/1997  . Smokeless tobacco: Current User    Types: Snuff  . Alcohol use No   Objective:   BP (!) 144/82 (BP Location: Right Arm, Patient Position: Sitting, Cuff Size: Normal)   Pulse 71   Temp 98 F (36.7 C) (Oral)   Resp 16   Wt 208 lb 12.8 oz (94.7 kg)   SpO2 99%   BMI 29.96 kg/m   Physical Exam  Constitutional: He is oriented to person, place, and time. He appears well-developed and well-nourished. No distress.  HENT:  Head: Normocephalic and atraumatic.  Right Ear: Hearing and external ear normal.  Left Ear: Hearing normal.  Nose: Nose normal.  Mouth/Throat: Oropharynx is clear and moist.  Left ear canal slightly reddened with white debris occluding it.  Eyes: Conjunctivae and lids are normal. Right eye exhibits no discharge. Left eye exhibits no discharge. No scleral icterus.  Pulmonary/Chest: Effort normal. No respiratory distress.  Musculoskeletal: Normal range of motion.  Neurological: He is alert and oriented to person, place, and time.  Skin: Skin is intact. No lesion and no rash noted.  Psychiatric: He has a normal mood and affect. His speech is normal and behavior is normal. Thought content normal.      Assessment & Plan:     1. Acute reactive otitis externa of left ear Onset 2 days ago after cleaning ear canal with  peroxide and a Kleenex. White debris occluding canal and some redness to canal. Suspect otitis externa. Will use Cortisporin ear drops and recheck Monday to see if discomfort and erythema calms down enough to allow irrigation. - neomycin-polymyxin-hydrocortisone (CORTISPORIN) otic solution; Place 3 drops into the left ear 4 (four) times daily.  Dispense: 10 mL; Refill: 0

## 2016-05-07 NOTE — Patient Instructions (Signed)

## 2016-05-10 ENCOUNTER — Encounter: Payer: Self-pay | Admitting: Family Medicine

## 2016-05-10 ENCOUNTER — Ambulatory Visit (INDEPENDENT_AMBULATORY_CARE_PROVIDER_SITE_OTHER): Payer: Managed Care, Other (non HMO) | Admitting: Family Medicine

## 2016-05-10 VITALS — BP 132/86 | HR 71 | Temp 97.8°F | Resp 16 | Wt 205.2 lb

## 2016-05-10 DIAGNOSIS — H60502 Unspecified acute noninfective otitis externa, left ear: Secondary | ICD-10-CM | POA: Diagnosis not present

## 2016-05-10 DIAGNOSIS — T162XXD Foreign body in left ear, subsequent encounter: Secondary | ICD-10-CM

## 2016-05-10 NOTE — Progress Notes (Signed)
Patient: [name] Male    DOB: [birth year]   56 y.o.   MRN: [id number] Visit Date: 05/10/2016  Today's Provider: Vernie Murders, PA   Chief Complaint  Patient presents with  . Ear Pain   Subjective:    HPI  Acute reactive otitis externa of left ear :  3 day follow up. Patient started neomycin-polymyxin-hydrocortisone otic solution on 05/07/2016. Patient reports good compliance with treatment plan. Intermittent left ear pain.   Past Medical History:  Diagnosis Date  . Arthritis   . Coronary artery disease   . Diabetes mellitus   . Dyslipidemia   . Gout   . History of tobacco use   . Hypertension   . Hypothyroidism   . Old inferior myocardial infarction   . Parathyroid adenoma   . Sleep apnea    does not use cpap  . Thyroid disease   . Ventricular tachycardia (HCC)    Symptomatic ventricular tachycardia   Past Surgical History:  Procedure Laterality Date  . CARDIAC CATHETERIZATION  11/2008   nonobstructive CAD, left main normal, LAD normal, minor LCx irregularities <10%, mRCA 30% ISR, dRCA 30% narrowing proximal to PDA and PLB bifurcation; stented segments widely patent, LVEF 55%; severe inferior basal wall hypokinesis  . CARDIAC CATHETERIZATION  08/2004   Acute inferior MI 2/2 dRCA occlusion s/p 2.5 x 24 mm Taxus stent and a 2.75 x 20 mm Taxus stent at the crux; LVEF 45%, moderate akinesis of the inferior wall  . CARDIAC CATHETERIZATION  2000   pRCA 4.0 x 13 mm MultiLink stent, dRCA 3.0 x 8 mm MultiLink stent  . CARDIAC CATHETERIZATION  10/06/11   normal left main, LAD, D1-3, LCx and OM1-OM3; 20% prox RCA stenosis, 40-50% ISR mid RCA, 20% ISR distal RCA; LVEF 45-50%; mild basal inferior wall hypokinesis.  . CHOLECYSTECTOMY    . LEFT HEART CATHETERIZATION WITH CORONARY ANGIOGRAM N/A 10/06/2011   Procedure: LEFT HEART CATHETERIZATION WITH CORONARY ANGIOGRAM;  Surgeon: Wellington Hampshire, MD;  Location: [facility];  Service: Cardiovascular;  Laterality: N/A;  . OTHER  SURGICAL HISTORY     Status post removal of parathyroid adenoma  . parathroid     Family History  Problem Relation Age of Onset  . Heart attack Father   . Stroke Father   . Alzheimer's disease Father   . Diabetes Mother    Allergies  Allergen Reactions  . Crestor [Rosuvastatin Calcium]     ITCHING AND PALPITATIONS  . Penicillins     As a child; took as adult and no reaction.     Previous Medications   AMPHETAMINE-DEXTROAMPHETAMINE (ADDERALL) 20 MG TABLET    Take by mouth.   ASPIRIN 81 MG TABLET    Take by mouth.   ATORVASTATIN (LIPITOR) 80 MG TABLET    Take 1 tablet (80 mg total) by mouth daily at 6 PM.   COLCHICINE 0.6 MG TABLET    Take by mouth.   LAMOTRIGINE (LAMICTAL) 25 MG TABLET    Take by mouth.   LEVOTHYROXINE (SYNTHROID, LEVOTHROID) 150 MCG TABLET    Take 1 tablet (150 mcg total) by mouth daily before breakfast.   NAPROXEN (NAPROSYN) 500 MG TABLET    Take 1 tablet (500 mg total) by mouth 2 (two) times daily with a meal.   NEOMYCIN-POLYMYXIN-HYDROCORTISONE (CORTISPORIN) OTIC SOLUTION    Place 3 drops into the left ear 4 (four) times daily.   NITROGLYCERIN (NITROSTAT) 0.4 MG SL TABLET    Place 1 tablet (0.4  mg total) under the tongue every 5 (five) minutes x 3 doses as needed for chest pain.   PIOGLITAZONE-METFORMIN (ACTOPLUS MET) 15-850 MG TABLET    TAKE 1 TABLET TWICE A DAY   RAMIPRIL (ALTACE) 2.5 MG CAPSULE    TAKE 1 CAPSULE DAILY   SERTRALINE (ZOLOFT) 100 MG TABLET    Take by mouth.   SOTALOL (BETAPACE) 120 MG TABLET    TAKE 1 TABLET TWICE A DAY   TEMAZEPAM (RESTORIL) 30 MG CAPSULE    Take by mouth.    Review of Systems  Constitutional: Negative.   HENT: Positive for ear pain.   Respiratory: Negative.   Cardiovascular: Negative.     Social History  Substance Use Topics  . Smoking status: Former Smoker    Quit date: 10/04/1997  . Smokeless tobacco: Current User    Types: Snuff  . Alcohol use No   Objective:   BP 132/86 (BP Location: Right Arm, Patient  Position: Sitting, Cuff Size: Normal)   Pulse 71   Temp 97.8 F (36.6 C) (Oral)   Resp 16   Wt 205 lb 3.2 oz (93.1 kg)   SpO2 99%   BMI 29.44 kg/m   Physical Exam  Constitutional: He is oriented to person, place, and time. He appears well-developed and well-nourished. No distress.  HENT:  Head: Normocephalic and atraumatic.  Right Ear: Hearing and external ear normal.  Left Ear: Hearing normal.  Nose: Nose normal.  Mouth/Throat: Oropharynx is clear and moist.  Left ear canal has a grey-white gel-type debris occlusion. No further erythema or drainage. Normal hearing without pain today.  Eyes: Conjunctivae and lids are normal. Right eye exhibits no discharge. Left eye exhibits no discharge. No scleral icterus.  Pulmonary/Chest: Effort normal. No respiratory distress.  Musculoskeletal: Normal range of motion.  Neurological: He is alert and oriented to person, place, and time.  Skin: Skin is intact. No lesion and no rash noted.  Psychiatric: He has a normal mood and affect. His speech is normal and behavior is normal. Thought content normal.      Assessment & Plan:     1. Acute noninfective otitis externa of left ear, unspecified type Much improved. No drainage or discomfort today. May use Cortisporin Drops if any discomfort returns.  2. Foreign body of left ear, subsequent encounter White gel-like debris in the left ear canal. Unable to get it to move with irrigation. Hearing is not affected and no pain. Will refer to an ENT. May need suction removal. - Ambulatory referral to ENT

## 2016-08-04 ENCOUNTER — Ambulatory Visit (INDEPENDENT_AMBULATORY_CARE_PROVIDER_SITE_OTHER): Payer: Managed Care, Other (non HMO) | Admitting: Family Medicine

## 2016-08-04 ENCOUNTER — Encounter: Payer: Self-pay | Admitting: Family Medicine

## 2016-08-04 VITALS — BP 112/80 | HR 68 | Temp 98.4°F | Resp 16 | Wt 203.0 lb

## 2016-08-04 DIAGNOSIS — R809 Proteinuria, unspecified: Secondary | ICD-10-CM

## 2016-08-04 DIAGNOSIS — E1129 Type 2 diabetes mellitus with other diabetic kidney complication: Secondary | ICD-10-CM

## 2016-08-04 DIAGNOSIS — E039 Hypothyroidism, unspecified: Secondary | ICD-10-CM

## 2016-08-04 LAB — POCT GLYCOSYLATED HEMOGLOBIN (HGB A1C)
Est. average glucose Bld gHb Est-mCnc: 180
Hemoglobin A1C: 7.9

## 2016-08-04 LAB — POCT UA - MICROALBUMIN: MICROALBUMIN (UR) POC: 50 mg/L

## 2016-08-04 NOTE — Progress Notes (Signed)
Patient: [name] Male    DOB: May 21, 1960   56 y.o.   MRN: [id number] Visit Date: 08/04/2016  Today's Provider: [name], MD   Chief Complaint  Patient presents with  . Diabetes  . Hypothyroidism   Subjective:    HPI      Diabetes Mellitus Type II, Follow-up:   Lab Results  Component Value Date   HGBA1C 8.1 02/03/2016   HGBA1C 8.4 10/02/2015   HGBA1C 8.7 (H) 02/12/2015    Last seen for diabetes 6 months ago.  Management since then includes continuing medications. He reports excellent compliance with treatment. He is not having side effects.  Current symptoms include none and have been stable. Home blood sugar records: not being checked recently  Episodes of hypoglycemia? no   Current Insulin Regimen: N/A Most Recent Eye Exam: last year Weight trend: decreasing steadily Prior visit with dietician: yes - [place] Current diet: in general, a "healthy" diet   Current exercise: walking about three times a week for 30 minutes  Pertinent Labs:    Component Value Date/Time   CHOL 114 02/24/2016 0852   TRIG 152 (H) 02/24/2016 0852   HDL 34 (L) 02/24/2016 0852   LDLCALC 50 02/24/2016 0852   CREATININE 0.99 02/24/2016 0852    Wt Readings from Last 3 Encounters:  08/04/16 203 lb (92.1 kg)  05/10/16 205 lb 3.2 oz (93.1 kg)  05/07/16 208 lb 12.8 oz (94.7 kg)    ------------------------------------------------------------------------   Hypothyroid, follow-up:  TSH  Date Value Ref Range Status  02/24/2016 15.800 (H) 0.450 - 4.500 uIU/mL Final  02/12/2015 12.450 (H) 0.450 - 4.500 uIU/mL Final  10/11/2013 4.85 0.41 - 5.90 uIU/mL Final   Wt Readings from Last 3 Encounters:  08/04/16 203 lb (92.1 kg)  05/10/16 205 lb 3.2 oz (93.1 kg)  05/07/16 208 lb 12.8 oz (94.7 kg)    He was last seen for hypothyroid 6 months ago.  Management since that visit includes increasing levothyroxine from 100 mcg to 150 mcg. He reports excellent  compliance with treatment. He is not having side effects.  He is exercising. He is experiencing heat / cold intolerance He denies change in energy level, diarrhea, nervousness, palpitations and weight changes Weight trend: decreasing steadily  ------------------------------------------------------------------------  Pt plans to retire this June.He has 2 daughters in [place] and a son in [place].  Allergies  Allergen Reactions  . Crestor [Rosuvastatin Calcium]     ITCHING AND PALPITATIONS  . Penicillins     As a child; took as adult and no reaction.     Current Outpatient Prescriptions:  .  amphetamine-dextroamphetamine (ADDERALL) 20 MG tablet, Take by mouth., Disp: , Rfl:  .  aspirin 81 MG tablet, Take by mouth., Disp: , Rfl:  .  atorvastatin (LIPITOR) 80 MG tablet, Take 1 tablet (80 mg total) by mouth daily at 6 PM., Disp: 90 tablet, Rfl: 3 .  colchicine 0.6 MG tablet, Take by mouth., Disp: , Rfl:  .  lamoTRIgine (LAMICTAL) 25 MG tablet, Take by mouth., Disp: , Rfl:  .  levothyroxine (SYNTHROID, LEVOTHROID) 150 MCG tablet, Take 1 tablet (150 mcg total) by mouth daily before breakfast., Disp: 90 tablet, Rfl: 3 .  pioglitazone-metformin (ACTOPLUS MET) 15-850 MG tablet, TAKE 1 TABLET TWICE A DAY, Disp: 180 tablet, Rfl: 3 .  ramipril (ALTACE) 2.5 MG capsule, TAKE 1 CAPSULE DAILY, Disp: 90 capsule, Rfl: 3 .  sertraline (ZOLOFT) 100 MG tablet, Take by mouth.,  Disp: , Rfl:  .  sotalol (BETAPACE) 120 MG tablet, TAKE 1 TABLET TWICE A DAY, Disp: 180 tablet, Rfl: 3 .  temazepam (RESTORIL) 30 MG capsule, Take by mouth., Disp: , Rfl:  .  nitroGLYCERIN (NITROSTAT) 0.4 MG SL tablet, Place 1 tablet (0.4 mg total) under the tongue every 5 (five) minutes x 3 doses as needed for chest pain., Disp: 25 tablet, Rfl: 6  Review of Systems  Constitutional: Negative for activity change, appetite change, chills, diaphoresis, fatigue, fever and unexpected weight change.  Eyes: Negative.   Respiratory:  Negative for shortness of breath.   Cardiovascular: Negative for chest pain, palpitations and leg swelling.  Endocrine: Positive for heat intolerance. Negative for cold intolerance, polydipsia, polyphagia and polyuria.  Allergic/Immunologic: Negative.   Psychiatric/Behavioral: Negative.     Social History  Substance Use Topics  . Smoking status: Former Smoker    Quit date: 10/04/1997  . Smokeless tobacco: Current User    Types: Snuff  . Alcohol use No   Objective:   BP 112/80 (BP Location: Right Arm, Patient Position: Sitting, Cuff Size: Large)   Pulse 68   Temp 98.4 F (36.9 C) (Oral)   Resp 16   Wt 203 lb (92.1 kg)   BMI 29.13 kg/m  Vitals:   08/04/16 1619  BP: 112/80  Pulse: 68  Resp: 16  Temp: 98.4 F (36.9 C)  TempSrc: Oral  Weight: 203 lb (92.1 kg)     Physical Exam  Constitutional: He is oriented to person, place, and time. He appears well-developed and well-nourished.  HENT:  Head: Normocephalic and atraumatic.  Eyes: Conjunctivae are normal.  Neck: Normal range of motion. No thyromegaly present.  Cardiovascular: Normal rate, regular rhythm and normal heart sounds.   Pulmonary/Chest: Effort normal and breath sounds normal. No respiratory distress.  Abdominal: Soft.  Musculoskeletal: He exhibits no edema.  Neurological: He is alert and oriented to person, place, and time.  Skin: Skin is warm and dry.  Psychiatric: He has a normal mood and affect. His behavior is normal. Judgment and thought content normal.        Assessment & Plan:     1. Type 2 diabetes mellitus with microalbuminuria, without long-term current use of insulin (HCC) Improving. Pt is retiring, so exercise was encouraged. Continue medications. Urine microalbumin was 50, but pt is on Ramipril for kidney protection. - POCT glycosylated hemoglobin (Hb A1C)--7.9 today. - POCT UA - Microalbumin Results for orders placed or performed in visit on 08/04/16  POCT glycosylated hemoglobin (Hb A1C)    Result Value Ref Range   Hemoglobin A1C 7.9    Est. average glucose Bld gHb Est-mCnc 180   POCT UA - Microalbumin  Result Value Ref Range   Microalbumin Ur, POC 50 mg/L     2. Hypothyroidism, unspecified type Will recheck TSH as below. FU pending results. - TSH  3.CAD All risk factors treated.Discussed regular exercise after retirement.     I have done the exam and reviewed the above chart and it is accurate to the best of my knowledge. Development worker, community has been used in this note in any air is in the dictation or transcription are unintentional.  [name], MD  [facility]

## 2016-09-12 ENCOUNTER — Other Ambulatory Visit: Payer: Self-pay | Admitting: Cardiology

## 2016-10-26 ENCOUNTER — Other Ambulatory Visit: Payer: Self-pay | Admitting: Family Medicine

## 2016-10-26 ENCOUNTER — Telehealth: Payer: Self-pay | Admitting: Cardiology

## 2016-10-26 MED ORDER — PIOGLITAZONE HCL-METFORMIN HCL 15-850 MG PO TABS: 1.0000 | ORAL_TABLET | Freq: Two times a day (BID) | ORAL | 3 refills | Status: DC

## 2016-10-26 MED ORDER — ATORVASTATIN CALCIUM 80 MG PO TABS: ORAL_TABLET | ORAL | 3 refills | Status: DC

## 2016-10-26 MED ORDER — SOTALOL HCL 120 MG PO TABS: 120.0000 mg | ORAL_TABLET | Freq: Two times a day (BID) | ORAL | 1 refills | Status: DC

## 2016-10-26 MED ORDER — RAMIPRIL 2.5 MG PO CAPS: 2.5000 mg | ORAL_CAPSULE | Freq: Every day | ORAL | 1 refills | Status: DC

## 2016-10-26 MED ORDER — LEVOTHYROXINE SODIUM 150 MCG PO TABS: 150.0000 ug | ORAL_TABLET | Freq: Every day | ORAL | 3 refills | Status: DC

## 2016-10-26 NOTE — Telephone Encounter (Signed)
Pt contacted office for refill request on the following medications:  pioglitazone-metformin (ACTOPLUS MET) 15-850 MG tablet   levothyroxine (SYNTHROID, LEVOTHROID) 150 MCG tablet  atorvastatin (LIPITOR) 80 MG tablet   [facility] mail order.  CB#[id number]/MW  Pt has had to change his mail order pharmacy/MW

## 2016-10-26 NOTE — Telephone Encounter (Signed)
Done, need to re check lab work on the next visit in [place]

## 2016-10-26 NOTE — Telephone Encounter (Signed)
Spoke with patient and he needed Rx's to new mail order Did schedule follow up for patient

## 2016-10-26 NOTE — Telephone Encounter (Signed)
New Message  Pt call requesting to speak with RN about change in pharmacies. Pt states he is no using [facility]   [phone] Fax [phone]

## 2016-10-28 ENCOUNTER — Other Ambulatory Visit: Payer: Self-pay

## 2016-10-28 MED ORDER — RAMIPRIL 2.5 MG PO CAPS: 2.5000 mg | ORAL_CAPSULE | Freq: Every day | ORAL | 0 refills | Status: DC

## 2016-10-28 MED ORDER — SOTALOL HCL 120 MG PO TABS: 120.0000 mg | ORAL_TABLET | Freq: Two times a day (BID) | ORAL | 0 refills | Status: DC

## 2016-12-15 ENCOUNTER — Encounter: Payer: Self-pay | Admitting: Family Medicine

## 2016-12-15 ENCOUNTER — Ambulatory Visit (INDEPENDENT_AMBULATORY_CARE_PROVIDER_SITE_OTHER): Payer: 59 | Admitting: Family Medicine

## 2016-12-15 VITALS — BP 122/70 | HR 68 | Temp 98.1°F | Resp 16 | Ht 70.0 in | Wt 197.0 lb

## 2016-12-15 DIAGNOSIS — E118 Type 2 diabetes mellitus with unspecified complications: Secondary | ICD-10-CM

## 2016-12-15 DIAGNOSIS — Z1211 Encounter for screening for malignant neoplasm of colon: Secondary | ICD-10-CM | POA: Diagnosis not present

## 2016-12-15 DIAGNOSIS — Z0000 Encounter for general adult medical examination without abnormal findings: Secondary | ICD-10-CM | POA: Diagnosis not present

## 2016-12-15 DIAGNOSIS — Z125 Encounter for screening for malignant neoplasm of prostate: Secondary | ICD-10-CM | POA: Diagnosis not present

## 2016-12-15 DIAGNOSIS — I10 Essential (primary) hypertension: Secondary | ICD-10-CM

## 2016-12-15 DIAGNOSIS — E039 Hypothyroidism, unspecified: Secondary | ICD-10-CM | POA: Diagnosis not present

## 2016-12-15 DIAGNOSIS — E7849 Other hyperlipidemia: Secondary | ICD-10-CM

## 2016-12-15 DIAGNOSIS — E784 Other hyperlipidemia: Secondary | ICD-10-CM

## 2016-12-15 LAB — IFOBT (OCCULT BLOOD): IFOBT: NEGATIVE

## 2016-12-15 NOTE — Patient Instructions (Signed)

## 2016-12-15 NOTE — Progress Notes (Signed)
Patient: [name], Male    DOB: 1960-09-23, 56 y.o.   MRN: [id number] Visit Date: 12/15/2016  Today's Provider: [name], MD   Chief Complaint  Patient presents with  . Annual Exam   Subjective:    Annual physical exam [name] is a 56 y.o. male who presents today for health maintenance and complete physical. He feels well. He reports exercising occasionally. He reports he is sleeping fairly well.  patient retired from work 08/20/2016.   Colonoscopy- never. Tdap- 12/17/2010.    Review of Systems  Constitutional: Negative.   HENT: Negative.   Eyes: Negative.   Respiratory: Negative.   Cardiovascular: Negative.   Gastrointestinal: Negative.   Endocrine: Negative.   Genitourinary: Negative.   Musculoskeletal: Negative.   Skin: Negative.   Allergic/Immunologic: Negative.   Neurological: Negative.   Hematological: Negative.   Psychiatric/Behavioral: Negative.     Social History      He  reports that he quit smoking about 19 years ago. His smokeless tobacco use includes Snuff. He reports that he does not drink alcohol or use drugs.       Social History   Social History  . Marital status: Married    Spouse name: N/A  . Number of children: 3  . 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 Smoking status: Former Smoker    Quit date: 10/04/1997  . Smokeless tobacco: Current User    Types: Snuff  . Alcohol use No  . Drug use: No  . Sexual activity: Yes   Other Topics Concern  . None   Social History Narrative   Lives in [place], [place] with wife.     Past Medical History:  Diagnosis Date  . Arthritis   . Coronary artery disease   . Diabetes mellitus   . Dyslipidemia   . Gout   . History of tobacco use   . Hypertension   . Hypothyroidism   . Old inferior myocardial infarction   . Parathyroid adenoma   . Sleep apnea    does not use cpap  . Thyroid disease   .  Ventricular tachycardia ([place])    Symptomatic ventricular tachycardia     Patient Active Problem List   Diagnosis Date Noted  . Gout 10/21/2014  . 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 ADD (attention deficit disorder) 08/26/2014  . Multiple blisters 08/26/2014  . Clinical depression 08/26/2014  . Diabetes mellitus, type 2 ([place]) 08/26/2014  . Essential (primary) hypertension 08/26/2014  . Anxiety, generalized 08/26/2014  . High potassium 08/26/2014  . HPTH (hyperparathyroidism) ([place]) 08/26/2014  . BP (high blood pressure) 08/26/2014  . Bug bite with infection 08/26/2014  . Injury, shoulder and upper arm 08/26/2014  . Cannot sleep 08/26/2014  . Calculus of kidney 08/26/2014  . Depression, major, recurrent, mild ([place]) 08/26/2014  . Bursitis of elbow 08/26/2014  . Affective bipolar disorder ([place]) 08/26/2014  . Adult hypothyroidism 08/26/2014  . Chest pain 10/06/2011  . Type 2 diabetes mellitus ([place]) 10/05/2011  . Hypothyroidism 10/05/2011  . Anxiety 10/05/2011  . CAD (coronary artery disease) 06/29/2011  . Old MI (myocardial infarction) 06/29/2011  . Hyperlipidemia 06/29/2011  . 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 CARDIAC CATHETERIZATION  11/2008   nonobstructive CAD, left main normal, LAD normal, minor LCx irregularities <10%, mRCA 30% ISR, dRCA 30%  narrowing proximal to PDA and PLB bifurcation; stented segments widely patent, LVEF 55%; severe inferior basal wall hypokinesis  . CARDIAC CATHETERIZATION  08/2004   Acute inferior MI 2/2 dRCA occlusion s/p 2.5 x 24 mm Taxus stent and a 2.75 x 20 mm Taxus stent at the crux; LVEF 45%, moderate akinesis of the inferior wall  . CARDIAC CATHETERIZATION  2000   pRCA 4.0 x 13 mm MultiLink stent, dRCA 3.0 x 8 mm MultiLink stent  . CARDIAC CATHETERIZATION  10/06/11   normal left main, LAD, D1-3, LCx and OM1-OM3; 20% prox RCA stenosis, 40-50% ISR mid RCA, 20% ISR distal RCA;  LVEF 45-50%; mild basal inferior wall hypokinesis.  . CHOLECYSTECTOMY    . LEFT HEART CATHETERIZATION WITH CORONARY ANGIOGRAM N/A 10/06/2011   Procedure: LEFT HEART CATHETERIZATION WITH CORONARY ANGIOGRAM;  Surgeon: Wellington Hampshire, MD;  Location: [facility];  Service: Cardiovascular;  Laterality: N/A;  . OTHER SURGICAL HISTORY     Status post removal of parathyroid adenoma  . parathroid      Family History        Family Status  Relation Status  . Father Deceased at age 52  . Mother Alive        His family history includes Alzheimer's disease in his father; Diabetes in his mother; Heart attack in his father; Stroke in his father.     Allergies  Allergen Reactions  . Crestor [Rosuvastatin Calcium]     ITCHING AND PALPITATIONS  . Penicillins     As a child; took as adult and no reaction.     Current Outpatient Prescriptions:  .  amphetamine-dextroamphetamine (ADDERALL) 20 MG tablet, Take by mouth., Disp: , Rfl:  .  aspirin 81 MG tablet, Take by mouth., Disp: , Rfl:  .  atorvastatin (LIPITOR) 80 MG tablet, TAKE 1 TABLET DAILY AT 6 P.M., Disp: 90 tablet, Rfl: 3 .  colchicine 0.6 MG tablet, Take by mouth., Disp: , Rfl:  .  lamoTRIgine (LAMICTAL) 25 MG tablet, Take by mouth., Disp: , Rfl:  .  levothyroxine (SYNTHROID, LEVOTHROID) 150 MCG tablet, Take 1 tablet (150 mcg total) by mouth daily before breakfast., Disp: 90 tablet, Rfl: 3 .  pioglitazone-metformin (ACTOPLUS MET) 15-850 MG tablet, Take 1 tablet by mouth 2 (two) times daily., Disp: 180 tablet, Rfl: 3 .  ramipril (ALTACE) 2.5 MG capsule, Take 1 capsule (2.5 mg total) by mouth daily., Disp: 90 capsule, Rfl: 0 .  sertraline (ZOLOFT) 100 MG tablet, Take by mouth., Disp: , Rfl:  .  sotalol (BETAPACE) 120 MG tablet, Take 1 tablet (120 mg total) by mouth 2 (two) times daily. Please schedule appointment., Disp: 180 tablet, Rfl: 0 .  temazepam (RESTORIL) 30 MG capsule, Take by mouth., Disp: , Rfl:  .  nitroGLYCERIN (NITROSTAT) 0.4 MG  SL tablet, Place 1 tablet (0.4 mg total) under the tongue every 5 (five) minutes x 3 doses as needed for chest pain., Disp: 25 tablet, Rfl: 6   Patient Care Team: [name], MD as PCP - General (Unknown Physician Specialty)      Objective:   Vitals: BP 122/70 (BP Location: Left Arm, Patient Position: Sitting, Cuff Size: Normal)   Pulse 68   Temp 98.1 F (36.7 C)   Resp 16   Ht _0  (1.778 m)   Wt 197 lb (89.4 kg)   SpO2 97%   BMI 28.27 kg/m    Vitals:   12/15/16 0828  BP: 122/70  Pulse: 68  Resp:  16  Temp: 98.1 F (36.7 C)  SpO2: 97%  Weight: 197 lb (89.4 kg)  Height: _0  (1.778 m)     Physical Exam  Constitutional: He is oriented to person, place, and time. He appears well-developed and well-nourished.  HENT:  Head: Normocephalic and atraumatic.  Right Ear: External ear normal.  Left Ear: External ear normal.  Nose: Nose normal.  Mouth/Throat: Oropharynx is clear and moist.  Eyes: Conjunctivae are normal. No scleral icterus.  Neck: No thyromegaly present.  Cardiovascular: Normal rate, regular rhythm, normal heart sounds and intact distal pulses.   Pulmonary/Chest: Effort normal and breath sounds normal.  Abdominal: Soft.  Genitourinary: Rectum normal, prostate normal and penis normal.  Musculoskeletal: Normal range of motion.  Right tibial plateau tenderness and swelling consistent with Osgood slaughter disease  Lymphadenopathy:    He has no cervical adenopathy.  Neurological: He is alert and oriented to person, place, and time. No cranial nerve deficit. He exhibits normal muscle tone. Coordination normal.  Diabetic foot exam normal.  Skin: Skin is warm and dry.  Psychiatric: He has a normal mood and affect. His behavior is normal. Judgment and thought content normal.     Depression Screen PHQ 2/9 Scores 12/15/2016  PHQ - 2 Score 0      Assessment & Plan:     Routine Health Maintenance and Physical Exam  Exercise Activities and  Dietary recommendations Goals    None      Immunization History  Administered Date(s) Administered  . Influenza,inj,Quad PF,6+ Mos 02/03/2016  . Tdap 12/17/2010    Health Maintenance  Topic Date Due  . PNEUMOCOCCAL POLYSACCHARIDE VACCINE (1) 09/24/1960  . FOOT EXAM  09/24/1968  . OPHTHALMOLOGY EXAM  09/24/1968  . HIV Screening  09/24/1973  . COLONOSCOPY  09/24/2008  . INFLUENZA VACCINE  10/20/2016  . HEMOGLOBIN A1C  02/04/2017  . TETANUS/TDAP  12/16/2020  . Hepatitis C Screening  Completed     Discussed health benefits of physical activity, and encouraged him to engage in regular exercise appropriate for his age and condition.   1. Annual physical exam   2. Essential (primary) hypertension  - Comprehensive metabolic panel  3. Hypothyroidism, unspecified type  - TSH  4. Type 2 diabetes mellitus with complication, without long-term current use of insulin (HCC)  - CBC with Differential/Platelet - Hemoglobin A1c  5. Other hyperlipidemia  - Lipid panel  6. Prostate cancer screening  - PSA  7. Screening for colon cancer  - IFOBT POC (occult bld, rslt in office); Future - IFOBT POC (occult bld, rslt in office) 8. Right Osgood slaughter disease  I have done the exam and reviewed the above chart and it is accurate to the best of my knowledge. Development worker, community has been used in this note in any air is in the dictation or transcription are unintentional. I have done the exam and reviewed the above chart and it is accurate to the best of my knowledge. Development worker, community has been used in this note in any air is in the dictation or transcription are unintentional.  [name], MD  [facility]

## 2016-12-20 ENCOUNTER — Encounter (INDEPENDENT_AMBULATORY_CARE_PROVIDER_SITE_OTHER): Payer: 59 | Admitting: Family Medicine

## 2016-12-20 DIAGNOSIS — Z0000 Encounter for general adult medical examination without abnormal findings: Secondary | ICD-10-CM

## 2016-12-30 NOTE — Progress Notes (Signed)
[name] Date of Birth: [birth year] Medical Record #[id number]  History of Present Illness: [name] is seen for followup CAD and VT . He has a significant history of coronary disease. He had his first myocardial infarction at age 56. At that time he had stenting of the proximal right coronary with a 4.0 x 13 mm MultiLink stent and the distal vessel with a 3.0 x 8 mm MultiLink stent. In June of 2010 he again had an acute inferior myocardial infarction related to occlusion of the distal right coronary. The distal vessel was stented with a 2.5 x 24 mm Taxus stent and a 2.75 x 20 mm Taxus stent at the crux. He had followup cardiac catheterization in September of 2010 which demonstrated nonobstructive disease and ejection fraction of 55%. His last evaluation with cardiac catheterization in July of 2013 showed nonobstructive disease as well.  He also has a history of symptomatic monomorphic ventricular tachycardia that has been treated with Betapace therapy.   On followup today he states he is doing well from a cardiac standpoint. He  denies palpitations, chest pain, SOB, or dizziness. He is now retired and stress level is much less. Notes sugars are not ideal. Scheduled for complete labs with primary care this week. He is doing a lot of fishing but really not any aerobic activity.   Current Outpatient Prescriptions on File Prior to Visit  Medication Sig Dispense Refill  . amphetamine-dextroamphetamine (ADDERALL) 20 MG tablet Take by mouth.    Marland Kitchen aspirin 81 MG tablet Take by mouth.    Marland Kitchen atorvastatin (LIPITOR) 80 MG tablet TAKE 1 TABLET DAILY AT 6 P.M. 90 tablet 3  . lamoTRIgine (LAMICTAL) 25 MG tablet Take by mouth.    . levothyroxine (SYNTHROID, LEVOTHROID) 150 MCG tablet Take 1 tablet (150 mcg total) by mouth daily before breakfast. 90 tablet 3  . nitroGLYCERIN (NITROSTAT) 0.4 MG SL tablet Place 1 tablet (0.4 mg total) under the tongue every 5 (five) minutes x 3 doses as needed for chest pain. 25  tablet 6  . pioglitazone-metformin (ACTOPLUS MET) 15-850 MG tablet Take 1 tablet by mouth 2 (two) times daily. 180 tablet 3  . ramipril (ALTACE) 2.5 MG capsule Take 1 capsule (2.5 mg total) by mouth daily. 90 capsule 0  . sertraline (ZOLOFT) 100 MG tablet Take by mouth.    . sotalol (BETAPACE) 120 MG tablet Take 1 tablet (120 mg total) by mouth 2 (two) times daily. Please schedule appointment. 180 tablet 0  . temazepam (RESTORIL) 30 MG capsule Take by mouth.     No current facility-administered medications on file prior to visit.     Allergies  Allergen Reactions  . Crestor [Rosuvastatin Calcium]     ITCHING AND PALPITATIONS  . Penicillins     As a child; took as adult and no reaction.    Past Medical History:  Diagnosis Date  . Arthritis   . Coronary artery disease   . Diabetes mellitus   . Dyslipidemia   . Gout   . History of tobacco use   . Hypertension   . Hypothyroidism   . Old inferior myocardial infarction   . Parathyroid adenoma   . Sleep apnea    does not use cpap  . Thyroid disease   . Ventricular tachycardia (HCC)    Symptomatic ventricular tachycardia    Past Surgical History:  Procedure Laterality Date  . CARDIAC CATHETERIZATION  11/2008   nonobstructive CAD, left main normal, LAD normal, minor LCx irregularities <  10%, mRCA 30% ISR, dRCA 30% narrowing proximal to PDA and PLB bifurcation; stented segments widely patent, LVEF 55%; severe inferior basal wall hypokinesis  . CARDIAC CATHETERIZATION  08/2004   Acute inferior MI 2/2 dRCA occlusion s/p 2.5 x 24 mm Taxus stent and a 2.75 x 20 mm Taxus stent at the crux; LVEF 45%, moderate akinesis of the inferior wall  . CARDIAC CATHETERIZATION  2000   pRCA 4.0 x 13 mm MultiLink stent, dRCA 3.0 x 8 mm MultiLink stent  . CARDIAC CATHETERIZATION  10/06/11   normal left main, LAD, D1-3, LCx and OM1-OM3; 20% prox RCA stenosis, 40-50% ISR mid RCA, 20% ISR distal RCA; LVEF 45-50%; mild basal inferior wall hypokinesis.  .  CHOLECYSTECTOMY    . LEFT HEART CATHETERIZATION WITH CORONARY ANGIOGRAM N/A 10/06/2011   Procedure: LEFT HEART CATHETERIZATION WITH CORONARY ANGIOGRAM;  Surgeon: Wellington Hampshire, MD;  Location: [facility];  Service: Cardiovascular;  Laterality: N/A;  . OTHER SURGICAL HISTORY     Status post removal of parathyroid adenoma  . parathroid      History  Smoking Status  . Former Smoker  . Quit date: 10/04/1997  Smokeless Tobacco  . Current User  . Types: Snuff    History  Alcohol Use No    Family History  Problem Relation Age of Onset  . Heart attack Father   . Stroke Father   . Alzheimer's disease Father   . Diabetes Mother     Review of Systems: As noted in history of present illness.  All other systems were reviewed and are negative.  Physical Exam: BP 107/70   Pulse 69   Ht 5' 10"  (1.778 m)   Wt 199 lb 3.2 oz (90.4 kg)   BMI 28.58 kg/m  GENERAL:  Well appearing WM in NAD HEENT:  PERRL, EOMI, sclera are clear. Oropharynx is clear. NECK:  No jugular venous distention, carotid upstroke brisk and symmetric, no bruits, no thyromegaly or adenopathy LUNGS:  Clear to auscultation bilaterally CHEST:  Unremarkable HEART:  RRR,  PMI not displaced or sustained,S1 and S2 within normal limits, no S3, no S4: no clicks, no rubs, no murmurs ABD:  Soft, nontender. BS +, no masses or bruits. No hepatomegaly, no splenomegaly EXT:  2 + pulses throughout, no edema, no cyanosis no clubbing SKIN:  Warm and dry.  No rashes NEURO:  Alert and oriented x 3. Cranial nerves II through XII intact. PSYCH:  Cognitively intact    LABORATORY DATA: Ecg today shows NSR with rate 69. Old inferior infarct. LAD. QTc is normal at 435 msec. I have personally reviewed and interpreted this study.  Lab Results  Component Value Date   WBC 10.5 02/24/2016   HGB 14.9 02/24/2016   HCT 44.2 02/24/2016   PLT 235 02/24/2016   GLUCOSE 264 (H) 02/24/2016   CHOL 114 02/24/2016   TRIG 152 (H) 02/24/2016   HDL  34 (L) 02/24/2016   LDLCALC 50 02/24/2016   ALT 15 02/24/2016   AST 15 02/24/2016   NA 140 02/24/2016   K 5.3 (H) 02/24/2016   CL 98 02/24/2016   CREATININE 0.99 02/24/2016   BUN 17 02/24/2016   CO2 25 02/24/2016   TSH 15.800 (H) 02/24/2016   INR 1.10 10/05/2011   HGBA1C 7.9 08/04/2016   MICROALBUR 50 08/04/2016     Assessment / Plan: 1. Coronary disease with multiple interventions as noted in history of present illness. Last cath in 2013 showed nonobstructive disease. Patient is asymptomatic.  Continue aspirin and statin therapy. Not on beta blocker since he is on Betapace. Needs to increase aerobic activity.  2. Ventricular tachycardia. This is well controlled with sotalol therapy. QTC is normal. He is asymptomatic  3. Dyslipidemia. Continue Lipitor high dose. Controlled.   4. Diabetes mellitus type 2. Follow up with primary care. If additional therapy is needed would consider SGLT 2 inhibitor given lower risk for CV events.   5. Hypothyroidism. On replacement therapy.  6. Anxiety/depression.  Encourage more aerobic exercise. I'll followup again in one year.Marland Kitchen

## 2017-01-03 ENCOUNTER — Encounter: Payer: Self-pay | Admitting: Cardiology

## 2017-01-03 ENCOUNTER — Ambulatory Visit (INDEPENDENT_AMBULATORY_CARE_PROVIDER_SITE_OTHER): Payer: 59 | Admitting: Cardiology

## 2017-01-03 VITALS — BP 107/70 | HR 69 | Ht 70.0 in | Wt 199.2 lb

## 2017-01-03 DIAGNOSIS — E7800 Pure hypercholesterolemia, unspecified: Secondary | ICD-10-CM | POA: Diagnosis not present

## 2017-01-03 DIAGNOSIS — I10 Essential (primary) hypertension: Secondary | ICD-10-CM | POA: Diagnosis not present

## 2017-01-03 DIAGNOSIS — I472 Ventricular tachycardia: Secondary | ICD-10-CM

## 2017-01-03 DIAGNOSIS — E118 Type 2 diabetes mellitus with unspecified complications: Secondary | ICD-10-CM | POA: Diagnosis not present

## 2017-01-03 DIAGNOSIS — I2510 Atherosclerotic heart disease of native coronary artery without angina pectoris: Secondary | ICD-10-CM | POA: Diagnosis not present

## 2017-01-03 DIAGNOSIS — I4729 Other ventricular tachycardia: Secondary | ICD-10-CM

## 2017-01-03 NOTE — Patient Instructions (Signed)
Continue your current therapy  I will see you in one year   

## 2017-01-05 LAB — COMPLETE METABOLIC PANEL WITH GFR
AG RATIO: 1.7 (calc) (ref 1.0–2.5)
ALT: 14 U/L (ref 9–46)
AST: 16 U/L (ref 10–35)
Albumin: 4.2 g/dL (ref 3.6–5.1)
Alkaline phosphatase (APISO): 92 U/L (ref 40–115)
BILIRUBIN TOTAL: 0.4 mg/dL (ref 0.2–1.2)
BUN: 20 mg/dL (ref 7–25)
CHLORIDE: 101 mmol/L (ref 98–110)
CO2: 29 mmol/L (ref 20–32)
Calcium: 9 mg/dL (ref 8.6–10.3)
Creat: 0.93 mg/dL (ref 0.70–1.33)
GFR, Est African American: 105 mL/min/{1.73_m2} (ref >=60)
GFR, Est Non African American: 90 mL/min/{1.73_m2} (ref >=60)
Globulin: 2.5 g/dL (calc) (ref 1.9–3.7)
Glucose, Bld: 197 mg/dL — ABNORMAL HIGH (ref 65–99)
POTASSIUM: 4.9 mmol/L (ref 3.5–5.3)
Sodium: 138 mmol/L (ref 135–146)
Total Protein: 6.7 g/dL (ref 6.1–8.1)

## 2017-01-05 LAB — LIPID PANEL
Cholesterol: 126 mg/dL (ref <200)
HDL: 32 mg/dL — ABNORMAL LOW (ref >40)
LDL Cholesterol (Calc): 67 mg/dL (calc)
NON-HDL CHOLESTEROL (CALC): 94 mg/dL (ref <130)
TRIGLYCERIDES: 208 mg/dL — AB (ref <150)
Total CHOL/HDL Ratio: 3.9 (calc) (ref <5.0)

## 2017-02-01 NOTE — Progress Notes (Deleted)
Possible OSA--consider sleep study.

## 2017-03-05 ENCOUNTER — Other Ambulatory Visit: Payer: Self-pay | Admitting: Cardiology

## 2017-03-07 NOTE — Telephone Encounter (Signed)
REFILL 

## 2017-04-01 ENCOUNTER — Other Ambulatory Visit: Payer: Self-pay | Admitting: Cardiology

## 2017-04-18 ENCOUNTER — Ambulatory Visit: Payer: Self-pay | Admitting: Family Medicine

## 2017-07-27 ENCOUNTER — Other Ambulatory Visit: Payer: Self-pay | Admitting: Family Medicine

## 2017-07-27 ENCOUNTER — Other Ambulatory Visit: Payer: Self-pay | Admitting: Cardiology

## 2017-08-23 ENCOUNTER — Ambulatory Visit (INDEPENDENT_AMBULATORY_CARE_PROVIDER_SITE_OTHER): Payer: 59 | Admitting: Family Medicine

## 2017-08-23 VITALS — BP 116/60 | HR 96 | Temp 97.8°F | Resp 14 | Wt 200.0 lb

## 2017-08-23 DIAGNOSIS — E039 Hypothyroidism, unspecified: Secondary | ICD-10-CM | POA: Diagnosis not present

## 2017-08-23 DIAGNOSIS — E118 Type 2 diabetes mellitus with unspecified complications: Secondary | ICD-10-CM

## 2017-08-23 DIAGNOSIS — E7849 Other hyperlipidemia: Secondary | ICD-10-CM

## 2017-08-23 DIAGNOSIS — Z125 Encounter for screening for malignant neoplasm of prostate: Secondary | ICD-10-CM

## 2017-08-23 DIAGNOSIS — I10 Essential (primary) hypertension: Secondary | ICD-10-CM | POA: Diagnosis not present

## 2017-08-23 NOTE — Progress Notes (Signed)
[name]  MRN: [id number] DOB: [birth year]  Subjective:  HPI   The patient is a 57 year old male who presents for follow up of chronic health.  He was last seen in September for his physical and had labs ordered.  However he has not had all the labs drawn due to problems at the lab getting his blood.  These are being re-ordered today.Pt retired 1 year ago. He feels well. No cardiac symptoms or any complaint.  Diabetes-He has not had his A1C since May 2018  Lab Results  Component Value Date   HGBA1C 7.9 08/04/2016   Hypercholesterolemia  Lab Results  Component Value Date   CHOL 126 01/05/2017   HDL 32 (L) 01/05/2017   LDLCALC 67 01/05/2017   TRIG 208 (H) 01/05/2017   CHOLHDL 3.9 01/05/2017   Hypertension  BP Readings from Last 3 Encounters:  08/23/17 116/60  01/03/17 107/70  12/15/16 122/70     Patient Active Problem List   Diagnosis Date Noted  . Gout 10/21/2014  . 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 ADD (attention deficit disorder) 08/26/2014  . Multiple blisters 08/26/2014  . Clinical depression 08/26/2014  . Diabetes mellitus, type 2 ([place]) 08/26/2014  . Essential (primary) hypertension 08/26/2014  . Anxiety, generalized 08/26/2014  . High potassium 08/26/2014  . HPTH (hyperparathyroidism) ([place]) 08/26/2014  . BP (high blood pressure) 08/26/2014  . Bug bite with infection 08/26/2014  . Injury, shoulder and upper arm 08/26/2014  . Cannot sleep 08/26/2014  . Calculus of kidney 08/26/2014  . Depression, major, recurrent, mild ([place]) 08/26/2014  . Bursitis of elbow 08/26/2014  . Affective bipolar disorder ([place]) 08/26/2014  . Adult hypothyroidism 08/26/2014  . Chest pain 10/06/2011  . Type 2 diabetes mellitus ([place]) 10/05/2011  . Hypothyroidism 10/05/2011  . Anxiety 10/05/2011  . CAD (coronary artery disease) 06/29/2011  . Old MI (myocardial infarction) 06/29/2011  . Hyperlipidemia 06/29/2011  . 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 Arthritis   . Coronary artery disease   . Diabetes mellitus   . Dyslipidemia   . Gout   . History of tobacco use   . Hypertension   . Hypothyroidism   . Old inferior myocardial infarction   . Parathyroid adenoma   . Sleep apnea    does not use cpap  . Thyroid disease   . Ventricular tachycardia (HCC)    Symptomatic ventricular tachycardia    Social History   Socioeconomic History  . Marital status: Married    Spouse name: Not on file  . Number of children: 3  . Years of education: Not on file  . Highest education level: Not on file  Occupational History  . Occupation: [employer]  . Financial resource strain: Not on file  . Food insecurity:    Worry: Not on file    Inability: Not on file  . Transportation needs:    Medical: Not on file    Non-medical: Not on file  Tobacco Use  . Smoking status: Former Smoker    Last attempt to quit: 10/04/1997    Years since quitting: 19.8  . Smokeless tobacco: Current User    Types: Snuff  Substance and Sexual Activity  . Alcohol use: No  . Drug use: No  . Sexual activity: Yes  Lifestyle  . Physical activity:    Days per week: Not on file    Minutes per session: Not on file  .  Stress: Not on file  Relationships  . Social connections:    Talks on phone: Not on file    Gets together: Not on file    Attends religious service: Not on file    Active member of club or organization: Not on file    Attends meetings of clubs or organizations: Not on file    Relationship status: Not on file  . Intimate partner violence:    Fear of current or ex partner: Not on file    Emotionally abused: Not on file    Physically abused: Not on file    Forced sexual activity: Not on file  Other Topics Concern  . Not on file  Social History Narrative   Lives in [place], [place] with wife.     Outpatient Encounter Medications as of 08/23/2017  Medication Sig Note  .  amphetamine-dextroamphetamine (ADDERALL) 20 MG tablet Take by mouth. 08/26/2014: for November Received from: [facility]  . aspirin 81 MG tablet Take by mouth. 08/26/2014: Received from: [facility]  . atorvastatin (LIPITOR) 80 MG tablet TAKE 1 TABLET DAILY AT 6 P.M.   . lamoTRIgine (LAMICTAL) 25 MG tablet Take by mouth. 08/26/2014: Received from: [facility]  . levothyroxine (SYNTHROID, LEVOTHROID) 150 MCG tablet TAKE 1 TABLET BY MOUTH  DAILY BEFORE BREAKFAST   . pioglitazone-metformin (ACTOPLUS MET) 15-850 MG tablet TAKE 1 TABLET BY MOUTH TWO  TIMES DAILY   . ramipril (ALTACE) 2.5 MG capsule TAKE 1 CAPSULE BY MOUTH  DAILY   . sertraline (ZOLOFT) 100 MG tablet Take by mouth. 08/26/2014: Received from: [facility]  . sotalol (BETAPACE) 120 MG tablet TAKE 1 TABLET BY MOUTH TWO  TIMES DAILY   . temazepam (RESTORIL) 30 MG capsule Take by mouth. 08/26/2014: Received from: [facility]  . nitroGLYCERIN (NITROSTAT) 0.4 MG SL tablet Place 1 tablet (0.4 mg total) under the tongue every 5 (five) minutes x 3 doses as needed for chest pain.    No facility-administered encounter medications on file as of 08/23/2017.     Allergies  Allergen Reactions  . Crestor [Rosuvastatin Calcium]     ITCHING AND PALPITATIONS  . Penicillins     As a child; took as adult and no reaction.    Review of Systems  Constitutional: Negative for fever and malaise/fatigue.  HENT: Negative.   Eyes: Negative.   Respiratory: Negative for cough, shortness of breath and wheezing.   Cardiovascular: Negative for chest pain, palpitations, orthopnea, claudication and leg swelling.  Gastrointestinal: Negative.   Genitourinary: Negative.   Musculoskeletal: Negative.   Skin: Negative.   Neurological: Negative.   Endo/Heme/Allergies: Negative.   Psychiatric/Behavioral: Negative.     Objective:  BP 116/60 (BP Location: Right Arm, Patient Position:  Sitting, Cuff Size: Normal)   Pulse 96   Temp 97.8 F (36.6 C) (Oral)   Resp 14   Wt 200 lb (90.7 kg)   BMI 28.70 kg/m   Physical Exam  Constitutional: He is oriented to person, place, and time and well-developed, well-nourished, and in no distress.  HENT:  Head: Normocephalic and atraumatic.  Right Ear: External ear normal.  Left Ear: External ear normal.  Nose: Nose normal.  Eyes: Conjunctivae are normal. No scleral icterus.  Neck: No thyromegaly present.  Cardiovascular: Normal rate, regular rhythm, normal heart sounds and intact distal pulses.  Pulmonary/Chest: Effort normal and breath sounds normal.  Abdominal: Soft.  Musculoskeletal: He exhibits no edema.  Neurological: He is alert and oriented to  person, place, and time. Gait normal. GCS score is 15.  Skin: Skin is warm and dry.  Psychiatric: Mood, memory, affect and judgment normal.    Assessment and Plan :  1. Prostate cancer screening  - PSA  2. Essential (primary) hypertension   3. Adult hypothyroidism  - TSH  4. Type 2 diabetes mellitus with complication, without long-term current use of insulin (HCC)  - Hemoglobin A1c  5. Other hyperlipidemia 6.CAD All risk factors treated.  I have done the exam and reviewed the chart and it is accurate to the best of my knowledge. Development worker, community has been used and  any errors in dictation or transcription are unintentional. [name] M.D. [facility]

## 2017-08-26 DIAGNOSIS — E118 Type 2 diabetes mellitus with unspecified complications: Secondary | ICD-10-CM | POA: Diagnosis not present

## 2017-08-26 DIAGNOSIS — E039 Hypothyroidism, unspecified: Secondary | ICD-10-CM | POA: Diagnosis not present

## 2017-08-26 DIAGNOSIS — Z125 Encounter for screening for malignant neoplasm of prostate: Secondary | ICD-10-CM | POA: Diagnosis not present

## 2017-08-27 LAB — TSH: TSH: 3.6 u[IU]/mL (ref 0.450–4.500)

## 2017-08-27 LAB — HEMOGLOBIN A1C
Est. average glucose Bld gHb Est-mCnc: 166 mg/dL
HEMOGLOBIN A1C: 7.4 % — AB (ref 4.8–5.6)

## 2017-08-27 LAB — PSA: PROSTATE SPECIFIC AG, SERUM: 0.6 ng/mL (ref 0.0–4.0)

## 2017-09-19 ENCOUNTER — Other Ambulatory Visit: Payer: Self-pay | Admitting: Cardiology

## 2017-09-20 ENCOUNTER — Other Ambulatory Visit: Payer: Self-pay | Admitting: Cardiology

## 2017-11-22 ENCOUNTER — Other Ambulatory Visit: Payer: Self-pay | Admitting: Family Medicine

## 2018-01-21 NOTE — Progress Notes (Signed)
[name] Date of Birth: 06/04/1960 Medical Record #[id number]  History of Present Illness: [name] is seen for followup CAD and VT . He has a significant history of coronary disease. He had his first myocardial infarction at age 57. At that time he had stenting of the proximal right coronary with a 4.0 x 13 mm MultiLink stent and the distal vessel with a 3.0 x 8 mm MultiLink stent. In June of 2010 he again had an acute inferior myocardial infarction related to occlusion of the distal right coronary. The distal vessel was stented with a 2.5 x 24 mm Taxus stent and a 2.75 x 20 mm Taxus stent at the crux. He had followup cardiac catheterization in September of 2010 which demonstrated nonobstructive disease and ejection fraction of 55%. His last evaluation with cardiac catheterization in July of 2013 showed nonobstructive disease as well.  He also has a history of symptomatic monomorphic ventricular tachycardia that has been treated with Betapace therapy- well controlled for the past 5 years.   On followup today he states he is doing well from a cardiac standpoint. He  denies palpitations, chest pain, SOB, or dizziness. He enjoys fishing. Doesn't exercise much which he blames on a bad knee.  Last A1c 7.4% in June. Due for chemistries and lipids and is scheduled to see primary care next month.   Current Outpatient Medications on File Prior to Visit  Medication Sig Dispense Refill  . amphetamine-dextroamphetamine (ADDERALL) 20 MG tablet Take by mouth.    Marland Kitchen aspirin 81 MG tablet Take by mouth.    Marland Kitchen atorvastatin (LIPITOR) 80 MG tablet TAKE 1 TABLET DAILY AT 6  P.M. 90 tablet 3  . lamoTRIgine (LAMICTAL) 25 MG tablet Take by mouth.    . levothyroxine (SYNTHROID, LEVOTHROID) 150 MCG tablet TAKE 1 TABLET BY MOUTH  DAILY BEFORE BREAKFAST 90 tablet 3  . nitroGLYCERIN (NITROSTAT) 0.4 MG SL tablet Place 1 tablet (0.4 mg total) under the tongue every 5 (five) minutes x 3 doses as needed for chest pain. 25  tablet 6  . pioglitazone-metformin (ACTOPLUS MET) 15-850 MG tablet TAKE 1 TABLET BY MOUTH TWO  TIMES DAILY 180 tablet 3  . ramipril (ALTACE) 2.5 MG capsule Take 1 capsule (2.5 mg total) by mouth daily. 90 capsule 0  . sertraline (ZOLOFT) 100 MG tablet Take by mouth.    . sotalol (BETAPACE) 120 MG tablet TAKE 1 TABLET BY MOUTH TWO  TIMES DAILY 180 tablet 1  . temazepam (RESTORIL) 30 MG capsule Take by mouth.     No current facility-administered medications on file prior to visit.     Allergies  Allergen Reactions  . Crestor [Rosuvastatin Calcium]     ITCHING AND PALPITATIONS  . Penicillins     As a child; took as adult and no reaction.    Past Medical History:  Diagnosis Date  . Arthritis   . Coronary artery disease   . Diabetes mellitus   . Dyslipidemia   . Gout   . History of tobacco use   . Hypertension   . Hypothyroidism   . Old inferior myocardial infarction   . Parathyroid adenoma   . Sleep apnea    does not use cpap  . Thyroid disease   . Ventricular tachycardia (HCC)    Symptomatic ventricular tachycardia    Past Surgical History:  Procedure Laterality Date  . CARDIAC CATHETERIZATION  11/2008   nonobstructive CAD, left main normal, LAD normal, minor LCx irregularities <10%, mRCA 30% ISR,  dRCA 30% narrowing proximal to PDA and PLB bifurcation; stented segments widely patent, LVEF 55%; severe inferior basal wall hypokinesis  . CARDIAC CATHETERIZATION  08/2004   Acute inferior MI 2/2 dRCA occlusion s/p 2.5 x 24 mm Taxus stent and a 2.75 x 20 mm Taxus stent at the crux; LVEF 45%, moderate akinesis of the inferior wall  . CARDIAC CATHETERIZATION  2000   pRCA 4.0 x 13 mm MultiLink stent, dRCA 3.0 x 8 mm MultiLink stent  . CARDIAC CATHETERIZATION  10/06/11   normal left main, LAD, D1-3, LCx and OM1-OM3; 20% prox RCA stenosis, 40-50% ISR mid RCA, 20% ISR distal RCA; LVEF 45-50%; mild basal inferior wall hypokinesis.  . CHOLECYSTECTOMY    . LEFT HEART CATHETERIZATION  WITH CORONARY ANGIOGRAM N/A 10/06/2011   Procedure: LEFT HEART CATHETERIZATION WITH CORONARY ANGIOGRAM;  Surgeon: Wellington Hampshire, MD;  Location: [facility];  Service: Cardiovascular;  Laterality: N/A;  . OTHER SURGICAL HISTORY     Status post removal of parathyroid adenoma  . parathroid      Social History   Tobacco Use  Smoking Status Former Smoker  . Last attempt to quit: 10/04/1997  . Years since quitting: 20.3  Smokeless Tobacco Current User  . Types: Snuff    Social History   Substance and Sexual Activity  Alcohol Use No    Family History  Problem Relation Age of Onset  . Heart attack Father   . Stroke Father   . Alzheimer's disease Father   . Diabetes Mother     Review of Systems: As noted in history of present illness.  All other systems were reviewed and are negative.  Physical Exam: BP 126/79   Pulse 68   Ht _0  (1.778 m)   Wt 200 lb (90.7 kg)   BMI 28.70 kg/m  GENERAL:  Well appearing WM in NAD HEENT:  PERRL, EOMI, sclera are clear. Oropharynx is clear. NECK:  No jugular venous distention, carotid upstroke brisk and symmetric, no bruits, no thyromegaly or adenopathy LUNGS:  Clear to auscultation bilaterally CHEST:  Unremarkable HEART:  RRR,  PMI not displaced or sustained,S1 and S2 within normal limits, no S3, no S4: no clicks, no rubs, no murmurs ABD:  Soft, nontender. BS +, no masses or bruits. No hepatomegaly, no splenomegaly EXT:  2 + pulses throughout, no edema, no cyanosis no clubbing SKIN:  Warm and dry.  No rashes NEURO:  Alert and oriented x 3. Cranial nerves II through XII intact. PSYCH:  Cognitively intact      LABORATORY DATA: Ecg today shows NSR with rate 61. Old inferior infarct. LAD. Low voltage. QTc is normal at 448 msec. I have personally reviewed and interpreted this study.  Lab Results  Component Value Date   WBC 10.5 02/24/2016   HGB 14.9 02/24/2016   HCT 44.2 02/24/2016   PLT 235 02/24/2016   GLUCOSE 197 (H)  01/05/2017   CHOL 126 01/05/2017   TRIG 208 (H) 01/05/2017   HDL 32 (L) 01/05/2017   LDLCALC 67 01/05/2017   ALT 14 01/05/2017   AST 16 01/05/2017   NA 138 01/05/2017   K 4.9 01/05/2017   CL 101 01/05/2017   CREATININE 0.93 01/05/2017   BUN 20 01/05/2017   CO2 29 01/05/2017   TSH 3.600 08/26/2017   INR 1.10 10/05/2011   HGBA1C 7.4 (H) 08/26/2017   MICROALBUR 50 08/04/2016     Assessment / Plan: 1. Coronary disease with multiple interventions as noted in history  of present illness. Last cath in 2013 showed nonobstructive disease. He is asymptomatic. Continue aspirin and statin therapy. Not on beta blocker since he is on Betapace. Recommend increasing aerobic activity.  2. Ventricular tachycardia. This is well controlled with sotalol therapy. QTC is normal. He is asymptomatic  3. Dyslipidemia. Continue Lipitor high dose. Labs followed by primary care.  4. Diabetes mellitus type 2. Per primary care.   5. Hypothyroidism. On replacement therapy. Last TSH normal.  6. Anxiety/depression.  Encourage more aerobic exercise. I'll followup again in one year.Marland Kitchen

## 2018-01-23 ENCOUNTER — Ambulatory Visit (INDEPENDENT_AMBULATORY_CARE_PROVIDER_SITE_OTHER): Payer: 59 | Admitting: Cardiology

## 2018-01-23 ENCOUNTER — Encounter: Payer: Self-pay | Admitting: Cardiology

## 2018-01-23 VITALS — BP 126/79 | HR 68 | Ht 70.0 in | Wt 200.0 lb

## 2018-01-23 DIAGNOSIS — I4729 Other ventricular tachycardia: Secondary | ICD-10-CM

## 2018-01-23 DIAGNOSIS — I2510 Atherosclerotic heart disease of native coronary artery without angina pectoris: Secondary | ICD-10-CM

## 2018-01-23 DIAGNOSIS — E7800 Pure hypercholesterolemia, unspecified: Secondary | ICD-10-CM | POA: Diagnosis not present

## 2018-01-23 DIAGNOSIS — I472 Ventricular tachycardia: Secondary | ICD-10-CM | POA: Diagnosis not present

## 2018-01-23 NOTE — Patient Instructions (Signed)
Continue your current therapy  Follow up in one year 

## 2018-02-01 ENCOUNTER — Other Ambulatory Visit: Payer: Self-pay

## 2018-02-01 MED ORDER — SOTALOL HCL 120 MG PO TABS: 120.0000 mg | ORAL_TABLET | Freq: Two times a day (BID) | ORAL | 4 refills | Status: DC

## 2018-02-10 ENCOUNTER — Other Ambulatory Visit: Payer: Self-pay | Admitting: Cardiology

## 2018-02-28 ENCOUNTER — Ambulatory Visit: Payer: Self-pay | Admitting: Family Medicine

## 2018-03-30 ENCOUNTER — Ambulatory Visit: Payer: Self-pay | Admitting: Family Medicine

## 2018-04-11 ENCOUNTER — Ambulatory Visit (INDEPENDENT_AMBULATORY_CARE_PROVIDER_SITE_OTHER): Payer: 59 | Admitting: Family Medicine

## 2018-04-11 VITALS — BP 132/80 | HR 68 | Temp 98.0°F | Wt 201.0 lb

## 2018-04-11 DIAGNOSIS — I10 Essential (primary) hypertension: Secondary | ICD-10-CM | POA: Diagnosis not present

## 2018-04-11 DIAGNOSIS — E039 Hypothyroidism, unspecified: Secondary | ICD-10-CM | POA: Diagnosis not present

## 2018-04-11 DIAGNOSIS — R809 Proteinuria, unspecified: Secondary | ICD-10-CM

## 2018-04-11 DIAGNOSIS — R42 Dizziness and giddiness: Secondary | ICD-10-CM

## 2018-04-11 DIAGNOSIS — E1129 Type 2 diabetes mellitus with other diabetic kidney complication: Secondary | ICD-10-CM | POA: Diagnosis not present

## 2018-04-11 DIAGNOSIS — E7849 Other hyperlipidemia: Secondary | ICD-10-CM | POA: Diagnosis not present

## 2018-04-11 DIAGNOSIS — F3341 Major depressive disorder, recurrent, in partial remission: Secondary | ICD-10-CM

## 2018-04-11 NOTE — Progress Notes (Signed)
[name]  MRN: [id number] DOB: [birth year]  Subjective:  HPI   The patient is a 58 year old male who presents for follow up of chronic health.  His last visit was on 08/23/17.     1. Type 2 diabetes mellitus with microalbuminuria, without long-term current use of insulin ([place]) Diabetes-the patient had his last A1C on 08/26/17 and it was 7.4 which was down from his previous 4 readings.  2. Other hyperlipidemia The patient has not had his lipids checked in over a year.   Lab Results  Component Value Date   CHOL 126 01/05/2017   HDL 32 (L) 01/05/2017   LDLCALC 67 01/05/2017   TRIG 208 (H) 01/05/2017   CHOLHDL 3.9 01/05/2017   3. Essential hypertension Patient is due for labs BP Readings from Last 3 Encounters:  04/11/18 132/80  01/23/18 126/79  08/23/17 116/60   4. Hypothyroidism, unspecified type Lab Results  Component Value Date   TSH 3.600 08/26/2017  He did have 3-4 days of dizziness a few weeks ago.  Patient Active Problem List   Diagnosis Date Noted  . Gout 10/21/2014  . 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 ADD (attention deficit disorder) 08/26/2014  . Multiple blisters 08/26/2014  . Clinical depression 08/26/2014  . Diabetes mellitus, type 2 ([place]) 08/26/2014  . Essential (primary) hypertension 08/26/2014  . Anxiety, generalized 08/26/2014  . High potassium 08/26/2014  . HPTH (hyperparathyroidism) ([place]) 08/26/2014  . BP (high blood pressure) 08/26/2014  . Bug bite with infection 08/26/2014  . Injury, shoulder and upper arm 08/26/2014  . Cannot sleep 08/26/2014  . Calculus of kidney 08/26/2014  . Depression, major, recurrent, mild ([place]) 08/26/2014  . Bursitis of elbow 08/26/2014  . Affective bipolar disorder ([place]) 08/26/2014  . Adult hypothyroidism 08/26/2014  . Chest pain 10/06/2011  . Type 2 diabetes mellitus ([place]) 10/05/2011  . Hypothyroidism 10/05/2011  . Anxiety 10/05/2011  . CAD (coronary artery disease) 06/29/2011  . Old MI (myocardial infarction)  06/29/2011  . Hyperlipidemia 06/29/2011  . Ventricular tachycardia, monomorphic ([place]) 06/29/2011    Past Medical History:  Diagnosis Date  . Arthritis   . Coronary artery disease   . Diabetes mellitus   . Dyslipidemia   . Gout   . History of tobacco use   . Hypertension   . Hypothyroidism   . Old inferior myocardial infarction   . Parathyroid adenoma   . Sleep apnea    does not use cpap  . Thyroid disease   . Ventricular tachycardia (HCC)    Symptomatic ventricular tachycardia    Social History   Socioeconomic History  . Marital status: Married    Spouse name: Not on file  . Number of children: 3  . Years of education: Not on file  . Highest education level: Not on file  Occupational History  . Occupation: [employer]  . Financial resource strain: Not on file  . Food insecurity:    Worry: Not on file    Inability: Not on file  . Transportation needs:    Medical: Not on file    Non-medical: Not on file  Tobacco Use  . Smoking status: Former Smoker    Last attempt to quit: 10/04/1997    Years since quitting: 20.5  . Smokeless tobacco: Current User    Types: Snuff  Substance and Sexual Activity  . Alcohol use: No  . Drug use: No  . Sexual activity: Yes  Lifestyle  . Physical activity:  Days per week: Not on file    Minutes per session: Not on file  . Stress: Not on file  Relationships  . Social connections:    Talks on phone: Not on file    Gets together: Not on file    Attends religious service: Not on file    Active member of club or organization: Not on file    Attends meetings of clubs or organizations: Not on file    Relationship status: Not on file  . Intimate partner violence:    Fear of current or ex partner: Not on file    Emotionally abused: Not on file    Physically abused: Not on file    Forced sexual activity: Not on file  Other Topics Concern  . Not on file  Social History Narrative   Lives in [place], [place] with  wife.     Outpatient Encounter Medications as of 04/11/2018  Medication Sig Note  . amphetamine-dextroamphetamine (ADDERALL) 20 MG tablet Take by mouth. 08/26/2014: for November Received from: [facility]  . aspirin 81 MG tablet Take by mouth. 08/26/2014: Received from: [facility]  . atorvastatin (LIPITOR) 80 MG tablet TAKE 1 TABLET DAILY AT 6  P.M.   . lamoTRIgine (LAMICTAL) 25 MG tablet Take by mouth. 08/26/2014: Received from: [facility]  . levothyroxine (SYNTHROID, LEVOTHROID) 150 MCG tablet TAKE 1 TABLET BY MOUTH  DAILY BEFORE BREAKFAST   . pioglitazone-metformin (ACTOPLUS MET) 15-850 MG tablet TAKE 1 TABLET BY MOUTH TWO  TIMES DAILY   . ramipril (ALTACE) 2.5 MG capsule TAKE 1 CAPSULE BY MOUTH  DAILY   . sertraline (ZOLOFT) 100 MG tablet Take by mouth. 08/26/2014: Received from: [facility]  . sotalol (BETAPACE) 120 MG tablet Take 1 tablet (120 mg total) by mouth 2 (two) times daily.   . temazepam (RESTORIL) 30 MG capsule Take by mouth. 08/26/2014: Received from: [facility]  . nitroGLYCERIN (NITROSTAT) 0.4 MG SL tablet Place 1 tablet (0.4 mg total) under the tongue every 5 (five) minutes x 3 doses as needed for chest pain.    No facility-administered encounter medications on file as of 04/11/2018.     Allergies  Allergen Reactions  . Crestor [Rosuvastatin Calcium]     ITCHING AND PALPITATIONS  . Penicillins     As a child; took as adult and no reaction.    Review of Systems  Constitutional: Negative for fever and malaise/fatigue.  HENT: Negative.   Respiratory: Negative for cough, shortness of breath and wheezing.   Cardiovascular: Negative for chest pain, palpitations, orthopnea, claudication and leg swelling.  Gastrointestinal: Negative.   Genitourinary: Negative for frequency.  Neurological: Negative.   Endo/Heme/Allergies: Negative for polydipsia.  Psychiatric/Behavioral: Negative.       Objective:  BP 132/80 (BP Location: Right Arm, Patient Position: Sitting, Cuff Size: Normal)   Pulse 68   Temp 98 F (36.7 C) (Oral)   Wt 201 lb (91.2 kg)   SpO2 99%   BMI 28.84 kg/m   Physical Exam  Constitutional: He is oriented to person, place, and time and well-developed, well-nourished, and in no distress.  HENT:  Head: Normocephalic and atraumatic.  Right Ear: External ear normal.  Left Ear: External ear normal.  Nose: Nose normal.  Eyes: Conjunctivae are normal. No scleral icterus.  Neck: No thyromegaly present.  Cardiovascular: Normal rate, regular rhythm, normal heart sounds and intact distal pulses.  Pulmonary/Chest: Effort normal and breath sounds normal.  Abdominal: Soft.  Musculoskeletal:  General: No edema.  Neurological: He is alert and oriented to person, place, and time. Gait normal. GCS score is 15.  Skin: Skin is warm and dry.  Psychiatric: Mood, memory, affect and judgment normal.    Assessment and Plan :   1. Type 2 diabetes mellitus with microalbuminuria, without long-term current use of insulin (HCC)  - Hemoglobin A1c  2. Other hyperlipidemia  - Comprehensive metabolic panel - Lipid Panel With LDL/HDL Ratio  3. Essential hypertension  - Comprehensive metabolic panel  4. Hypothyroidism, unspecified type  - Lipid Panel With LDL/HDL Ratio   5. Vertigo 3-4 days a few weeks ago--resolved.  6. Recurrent major depressive disorder, in partial remission ([place])   I have done the exam and reviewed the chart and it is accurate to the best of my knowledge. Development worker, community has been used and  any errors in dictation or transcription are unintentional. [name] M.D. [facility]

## 2018-05-30 ENCOUNTER — Ambulatory Visit: Payer: Self-pay | Admitting: Family Medicine

## 2018-07-30 ENCOUNTER — Other Ambulatory Visit: Payer: Self-pay | Admitting: Family Medicine

## 2018-08-21 ENCOUNTER — Other Ambulatory Visit: Payer: Self-pay | Admitting: Family Medicine

## 2018-08-23 ENCOUNTER — Other Ambulatory Visit: Payer: Self-pay

## 2018-08-23 MED ORDER — PIOGLITAZONE HCL-METFORMIN HCL 15-850 MG PO TABS: 1.0000 | ORAL_TABLET | Freq: Two times a day (BID) | ORAL | 0 refills | Status: DC

## 2018-09-07 ENCOUNTER — Encounter: Payer: Self-pay | Admitting: Family Medicine

## 2018-09-15 ENCOUNTER — Other Ambulatory Visit: Payer: Self-pay | Admitting: Family Medicine

## 2018-10-03 ENCOUNTER — Telehealth: Payer: Self-pay | Admitting: *Deleted

## 2018-10-03 DIAGNOSIS — Z20822 Contact with and (suspected) exposure to covid-19: Secondary | ICD-10-CM

## 2018-10-03 NOTE — Telephone Encounter (Signed)
-----   Message from [name], MD sent at 10/03/2018  9:35 AM EDT ----- OK to test for covid--thanks ----- Message ----- From: Denyce Robert, RN Sent: 10/03/2018   8:47 AM EDT To: [name], MD, #  Hello,  I spoke with patient's wife Gregary Signs, and she is requesting that her husband be tested for COVID 19 as well.  She states she and her husband were both home when the people doing work at their home who tested positive were there.  Just need an OK from Dr. Rosanna Randy.  A reply to this message will be sufficient.   Thank you, Nolberto Hanlon, RN

## 2018-10-03 NOTE — Telephone Encounter (Signed)
Scheduled patient for COVID 19 test 10/04/2018 at 8 am at [facility] building.  Testing protocol reviewed.

## 2018-10-04 ENCOUNTER — Other Ambulatory Visit: Payer: 59

## 2018-10-04 DIAGNOSIS — Z20822 Contact with and (suspected) exposure to covid-19: Secondary | ICD-10-CM

## 2018-10-08 LAB — NOVEL CORONAVIRUS, NAA: SARS-CoV-2, NAA: NOT DETECTED

## 2018-10-09 ENCOUNTER — Telehealth: Payer: Self-pay | Admitting: Family Medicine

## 2018-10-09 NOTE — Telephone Encounter (Signed)
Pt wanting to get the results of his COVID test recently done.  Please call pt back with results asap.  Thanks, [employer]

## 2018-10-10 NOTE — Telephone Encounter (Signed)
No covid

## 2018-10-10 NOTE — Telephone Encounter (Signed)
Results are in chart. Please advise

## 2018-10-10 NOTE — Telephone Encounter (Signed)
LVM[facility]C 

## 2018-10-12 NOTE — Telephone Encounter (Signed)
Patient was advised and stated that he seen the results on MyChart.

## 2018-11-07 ENCOUNTER — Other Ambulatory Visit: Payer: Self-pay | Admitting: Family Medicine

## 2018-12-13 ENCOUNTER — Encounter: Payer: Self-pay | Admitting: Family Medicine

## 2018-12-13 ENCOUNTER — Other Ambulatory Visit: Payer: Self-pay

## 2018-12-13 ENCOUNTER — Ambulatory Visit (INDEPENDENT_AMBULATORY_CARE_PROVIDER_SITE_OTHER): Payer: 59 | Admitting: Family Medicine

## 2018-12-13 VITALS — BP 134/70 | HR 64 | Temp 98.4°F | Resp 16 | Ht 70.0 in | Wt 189.0 lb

## 2018-12-13 DIAGNOSIS — Z1211 Encounter for screening for malignant neoplasm of colon: Secondary | ICD-10-CM

## 2018-12-13 DIAGNOSIS — Z0000 Encounter for general adult medical examination without abnormal findings: Secondary | ICD-10-CM

## 2018-12-13 DIAGNOSIS — Z125 Encounter for screening for malignant neoplasm of prostate: Secondary | ICD-10-CM | POA: Diagnosis not present

## 2018-12-13 NOTE — Progress Notes (Signed)
Patient: [name], Male    DOB: 1960-07-15, 58 y.o.   MRN: [id number] Visit Date: 12/13/2018  Today's Provider: [name], MD   Chief Complaint  Patient presents with  . Annual Exam   Subjective:     Annual physical exam [name] is a 58 y.o. male who presents today for health maintenance and complete physical. He feels well. He reports exercising not regularly, but he does stay active. He reports he is sleeping fairly well.pt retired.  Colonoscopy- never. Last IFOBT was negative 12/15/2016.  Immunization History  Administered Date(s) Administered  . Influenza,inj,Quad PF,6+ Mos 02/03/2016  . Tdap 12/17/2010    Review of Systems  Constitutional: Negative.   HENT: Negative.   Eyes: Negative.   Respiratory: Negative.   Cardiovascular: Negative.   Gastrointestinal: Negative.   Endocrine: Negative.   Genitourinary: Negative.   Musculoskeletal: Negative.   Skin: Negative.   Allergic/Immunologic: Negative.   Neurological: Negative.   Hematological: Negative.   Psychiatric/Behavioral: Negative.     Social History      He  reports that he quit smoking about 21 years ago. His smokeless tobacco use includes snuff. He reports that he does not drink alcohol or use drugs.       Social History   Socioeconomic History  . Marital status: Married    Spouse name: Not on file  . Number of children: 3  . Years of education: Not on file  . Highest education level: Not on file  Occupational History  . Occupation: [employer]  . Financial resource strain: Not on file  . Food insecurity    Worry: Not on file    Inability: Not on file  . Transportation needs    Medical: Not on file    Non-medical: Not on file  Tobacco Use  . Smoking status: Former Smoker    Quit date: 10/04/1997    Years since quitting: 21.2  . Smokeless tobacco: Current User    Types: Snuff  Substance and Sexual Activity  . Alcohol use: No  . Drug use: No  .  Sexual activity: Yes  Lifestyle  . Physical activity    Days per week: Not on file    Minutes per session: Not on file  . Stress: Not on file  Relationships  . Social Herbalist on phone: Not on file    Gets together: Not on file    Attends religious service: Not on file    Active member of club or organization: Not on file    Attends meetings of clubs or organizations: Not on file    Relationship status: Not on file  Other Topics Concern  . Not on file  Social History Narrative   Lives in [place], [place] with wife.     Past Medical History:  Diagnosis Date  . Arthritis   . Coronary artery disease   . Diabetes mellitus   . Dyslipidemia   . Gout   . History of tobacco use   . Hypertension   . Hypothyroidism   . Old inferior myocardial infarction   . Parathyroid adenoma   . Sleep apnea    does not use cpap  . Thyroid disease   . Ventricular tachycardia ([place])    Symptomatic ventricular tachycardia     Patient Active Problem List   Diagnosis Date Noted  . Gout 10/21/2014  . 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 ADD (attention  deficit disorder) 08/26/2014  . Multiple blisters 08/26/2014  . Clinical depression 08/26/2014  . Diabetes mellitus, type 2 ([place]) 08/26/2014  . Essential (primary) hypertension 08/26/2014  . Anxiety, generalized 08/26/2014  . High potassium 08/26/2014  . HPTH (hyperparathyroidism) ([place]) 08/26/2014  . BP (high blood pressure) 08/26/2014  . Bug bite with infection 08/26/2014  . Injury, shoulder and upper arm 08/26/2014  . Cannot sleep 08/26/2014  . Calculus of kidney 08/26/2014  . Depression, major, recurrent, mild ([place]) 08/26/2014  . Bursitis of elbow 08/26/2014  . Affective bipolar disorder ([place]) 08/26/2014  . Adult hypothyroidism 08/26/2014  . Chest pain 10/06/2011  . Type 2 diabetes mellitus ([place]) 10/05/2011  . Hypothyroidism 10/05/2011  . Anxiety 10/05/2011  . CAD (coronary artery disease) 06/29/2011  . Old MI (myocardial  infarction) 06/29/2011  . Hyperlipidemia 06/29/2011  . 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 CARDIAC CATHETERIZATION  11/2008   nonobstructive CAD, left main normal, LAD normal, minor LCx irregularities <10%, mRCA 30% ISR, dRCA 30% narrowing proximal to PDA and PLB bifurcation; stented segments widely patent, LVEF 55%; severe inferior basal wall hypokinesis  . CARDIAC CATHETERIZATION  08/2004   Acute inferior MI 2/2 dRCA occlusion s/p 2.5 x 24 mm Taxus stent and a 2.75 x 20 mm Taxus stent at the crux; LVEF 45%, moderate akinesis of the inferior wall  . CARDIAC CATHETERIZATION  2000   pRCA 4.0 x 13 mm MultiLink stent, dRCA 3.0 x 8 mm MultiLink stent  . CARDIAC CATHETERIZATION  10/06/11   normal left main, LAD, D1-3, LCx and OM1-OM3; 20% prox RCA stenosis, 40-50% ISR mid RCA, 20% ISR distal RCA; LVEF 45-50%; mild basal inferior wall hypokinesis.  . CHOLECYSTECTOMY    . LEFT HEART CATHETERIZATION WITH CORONARY ANGIOGRAM N/A 10/06/2011   Procedure: LEFT HEART CATHETERIZATION WITH CORONARY ANGIOGRAM;  Surgeon: Wellington Hampshire, MD;  Location: [facility];  Service: Cardiovascular;  Laterality: N/A;  . OTHER SURGICAL HISTORY     Status post removal of parathyroid adenoma  . parathroid      Family History        Family Status  Relation Name Status  . Father  Deceased at age 50  . Mother  Alive        His family history includes Alzheimer's disease in his father; Diabetes in his mother; Heart attack in his father; Stroke in his father.      Allergies  Allergen Reactions  . Crestor [Rosuvastatin Calcium]     ITCHING AND PALPITATIONS  . Penicillins     As a child; took as adult and no reaction.     Current Outpatient Medications:  .  amphetamine-dextroamphetamine (ADDERALL) 20 MG tablet, Take by mouth., Disp: , Rfl:  .  aspirin 81 MG tablet, Take by mouth., Disp: , Rfl:  .  atorvastatin (LIPITOR) 80 MG tablet,  TAKE 1 TABLET BY MOUTH  DAILY AT 6PM, Disp: 90 tablet, Rfl: 3 .  lamoTRIgine (LAMICTAL) 25 MG tablet, Take by mouth., Disp: , Rfl:  .  levothyroxine (SYNTHROID) 150 MCG tablet, TAKE 1 TABLET BY MOUTH  DAILY BEFORE BREAKFAST, Disp: 90 tablet, Rfl: 3 .  pioglitazone-metformin (ACTOPLUS MET) 15-850 MG tablet, TAKE 1 TABLET BY MOUTH TWICE A DAY, Disp: 60 tablet, Rfl: 11 .  ramipril (ALTACE) 2.5 MG capsule, TAKE 1 CAPSULE BY MOUTH  DAILY, Disp: 90 capsule, Rfl: 3 .  sertraline (ZOLOFT) 100 MG tablet, Take by mouth., Disp: ,  Rfl:  .  sotalol (BETAPACE) 120 MG tablet, Take 1 tablet (120 mg total) by mouth 2 (two) times daily., Disp: 180 tablet, Rfl: 4 .  temazepam (RESTORIL) 30 MG capsule, Take by mouth., Disp: , Rfl:  .  nitroGLYCERIN (NITROSTAT) 0.4 MG SL tablet, Place 1 tablet (0.4 mg total) under the tongue every 5 (five) minutes x 3 doses as needed for chest pain., Disp: 25 tablet, Rfl: 6   Patient Care Team: [name], MD as PCP - General (Unknown Physician Specialty)    Objective:    Vitals: BP 134/70   Pulse 64   Temp 98.4 F (36.9 C)   Resp 16   Ht 5' 10" (1.778 m)   Wt 189 lb (85.7 kg)   BMI 27.12 kg/m    Vitals:   12/13/18 1402  BP: 134/70  Pulse: 64  Resp: 16  Temp: 98.4 F (36.9 C)  Weight: 189 lb (85.7 kg)  Height: 5' 10" (1.778 m)     Physical Exam Constitutional:      Appearance: He is well-developed.  HENT:     Head: Normocephalic and atraumatic.     Right Ear: External ear normal.     Left Ear: External ear normal.     Nose: Nose normal.  Eyes:     General: No scleral icterus.    Conjunctiva/sclera: Conjunctivae normal.  Neck:     Thyroid: No thyromegaly.  Cardiovascular:     Rate and Rhythm: Normal rate and regular rhythm.     Heart sounds: Normal heart sounds.  Pulmonary:     Effort: Pulmonary effort is normal.     Breath sounds: Normal breath sounds.  Abdominal:     Palpations: Abdomen is soft.  Genitourinary:    Penis: Normal.       Prostate: Normal.     Rectum: Normal.  Musculoskeletal: Normal range of motion.     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  Skin:    General: Skin is warm and dry.  Neurological:     General: No focal deficit present.     Mental Status: He is alert and oriented to person, place, and time.     Cranial Nerves: No cranial nerve deficit.     Motor: No abnormal muscle tone.     Coordination: Coordination normal.     Comments: Diabetic foot exam normal.  Psychiatric:        Mood and Affect: Mood normal.        Behavior: Behavior normal.        Thought Content: Thought content normal.        Judgment: Judgment normal.      Depression Screen PHQ 2/9 Scores 12/13/2018 12/15/2016  PHQ - 2 Score 2 0  PHQ- 9 Score 4 -       Assessment & Plan:     Routine Health Maintenance and Physical Exam  Exercise Activities and Dietary recommendations Goals   None     Immunization History  Administered Date(s) Administered  . Influenza,inj,Quad PF,6+ Mos 02/03/2016  . Tdap 12/17/2010    Health Maintenance  Topic Date Due  . PNEUMOCOCCAL POLYSACCHARIDE VACCINE AGE 33-64 HIGH RISK  09/24/1960  . FOOT EXAM  09/24/1968  . OPHTHALMOLOGY EXAM  09/24/1968  . HIV Screening  09/24/1973  . COLONOSCOPY  09/24/2008  . HEMOGLOBIN A1C  02/25/2018  . INFLUENZA VACCINE  10/21/2018  .  TETANUS/TDAP  12/16/2020  . Hepatitis C Screening  Completed     Discussed health benefits of physical activity, and encouraged him to engage in regular exercise appropriate for his age and condition.  1. Annual physical exam Pt encouraged to exercise. - CBC with Differential/Platelet - Comprehensive metabolic panel - Lipid panel - TSH  2. Prostate cancer screening  - PSA  3. Screening for colon cancer He declines colonoscopy. - Cologuard     Sheika Coutts Cranford Mon, MD  [facility]

## 2019-01-12 ENCOUNTER — Other Ambulatory Visit: Payer: Self-pay | Admitting: Cardiology

## 2019-01-22 LAB — COLOGUARD: Cologuard: NEGATIVE

## 2019-02-06 NOTE — Progress Notes (Signed)
[name] Date of Birth: 1961/03/03 Medical Record #[id number]  History of Present Illness: [name] is seen for followup CAD and VT . He has a significant history of coronary disease. He had his first myocardial infarction at age 58. At that time he had stenting of the proximal right coronary with a 4.0 x 13 mm MultiLink stent and the distal vessel with a 3.0 x 8 mm MultiLink stent. In June of 2010 he again had an acute inferior myocardial infarction related to occlusion of the distal right coronary. The distal vessel was stented with a 2.5 x 24 mm Taxus stent and a 2.75 x 20 mm Taxus stent at the crux. He had followup cardiac catheterization in September of 2010 which demonstrated nonobstructive disease and ejection fraction of 55%. His last evaluation with cardiac catheterization in July of 2013 showed nonobstructive disease as well.  He also has a history of symptomatic monomorphic ventricular tachycardia that has been treated with Betapace therapy- well controlled for the past 5 years.   On followup today he states he is doing well from a cardiac standpoint. He  denies palpitations, chest pain, SOB, or dizziness. He spends a lot of time  fishing. He does some walking. He has recently moved to a new house. Saw Dr Rosanna Randy in September and was supposed to have labs done but he never went.   Current Outpatient Medications on File Prior to Visit  Medication Sig Dispense Refill  . amphetamine-dextroamphetamine (ADDERALL) 20 MG tablet Take by mouth.    Marland Kitchen aspirin 81 MG tablet Take by mouth.    Marland Kitchen atorvastatin (LIPITOR) 80 MG tablet TAKE 1 TABLET BY MOUTH  DAILY AT 6PM 90 tablet 3  . lamoTRIgine (LAMICTAL) 25 MG tablet Take by mouth.    . levothyroxine (SYNTHROID) 150 MCG tablet TAKE 1 TABLET BY MOUTH  DAILY BEFORE BREAKFAST 90 tablet 3  . pioglitazone-metformin (ACTOPLUS MET) 15-850 MG tablet TAKE 1 TABLET BY MOUTH TWICE A DAY 60 tablet 11  . ramipril (ALTACE) 2.5 MG capsule TAKE 1 CAPSULE BY  MOUTH  DAILY 90 capsule 3  . sertraline (ZOLOFT) 100 MG tablet Take by mouth.    . sotalol (BETAPACE) 120 MG tablet Take 1 tablet (120 mg total) by mouth 2 (two) times daily. 180 tablet 4  . temazepam (RESTORIL) 30 MG capsule Take by mouth.     No current facility-administered medications on file prior to visit.     Allergies  Allergen Reactions  . Crestor [Rosuvastatin Calcium]     ITCHING AND PALPITATIONS  . Penicillins     As a child; took as adult and no reaction.    Past Medical History:  Diagnosis Date  . Arthritis   . Coronary artery disease   . Diabetes mellitus   . Dyslipidemia   . Gout   . History of tobacco use   . Hypertension   . Hypothyroidism   . Old inferior myocardial infarction   . Parathyroid adenoma   . Sleep apnea    does not use cpap  . Thyroid disease   . Ventricular tachycardia ([facility])    Symptomatic ventricular tachycardia    Past Surgical History:  Procedure Laterality Date  . CARDIAC CATHETERIZATION  11/2008   nonobstructive CAD, left main normal, LAD normal, minor LCx irregularities <10%, mRCA 30% ISR, dRCA 30% narrowing proximal to PDA and PLB bifurcation; stented segments widely patent, LVEF 55%; severe inferior basal wall hypokinesis  . CARDIAC CATHETERIZATION  08/2004   Acute  inferior MI 2/2 dRCA occlusion s/p 2.5 x 24 mm Taxus stent and a 2.75 x 20 mm Taxus stent at the crux; LVEF 45%, moderate akinesis of the inferior wall  . CARDIAC CATHETERIZATION  2000   pRCA 4.0 x 13 mm MultiLink stent, dRCA 3.0 x 8 mm MultiLink stent  . CARDIAC CATHETERIZATION  10/06/11   normal left main, LAD, D1-3, LCx and OM1-OM3; 20% prox RCA stenosis, 40-50% ISR mid RCA, 20% ISR distal RCA; LVEF 45-50%; mild basal inferior wall hypokinesis.  . CHOLECYSTECTOMY    . LEFT HEART CATHETERIZATION WITH CORONARY ANGIOGRAM N/A 10/06/2011   Procedure: LEFT HEART CATHETERIZATION WITH CORONARY ANGIOGRAM;  Surgeon: Wellington Hampshire, MD;  Location: [facility];  Service:  Cardiovascular;  Laterality: N/A;  . OTHER SURGICAL HISTORY     Status post removal of parathyroid adenoma  . parathroid      Social History   Tobacco Use  Smoking Status Former Smoker  . Quit date: 10/04/1997  . Years since quitting: 21.3  Smokeless Tobacco Current User  . Types: Snuff    Social History   Substance and Sexual Activity  Alcohol Use No    Family History  Problem Relation Age of Onset  . Heart attack Father   . Stroke Father   . Alzheimer's disease Father   . Diabetes Mother     Review of Systems: As noted in history of present illness.  All other systems were reviewed and are negative.  Physical Exam: BP 100/62   Pulse (!) 57   Ht _0  (1.778 m)   Wt 190 lb 12.8 oz (86.5 kg)   BMI 27.38 kg/m  GENERAL:  Well appearing WM in NAD HEENT:  PERRL, EOMI, sclera are clear. Oropharynx is clear. NECK:  No jugular venous distention, carotid upstroke brisk and symmetric, no bruits, no thyromegaly or adenopathy LUNGS:  Clear to auscultation bilaterally CHEST:  Unremarkable HEART:  RRR,  PMI not displaced or sustained,S1 and S2 within normal limits, no S3, no S4: no clicks, no rubs, no murmurs ABD:  Soft, nontender. BS +, no masses or bruits. No hepatomegaly, no splenomegaly EXT:  2 + pulses throughout, no edema, no cyanosis no clubbing SKIN:  Warm and dry.  No rashes NEURO:  Alert and oriented x 3. Cranial nerves II through XII intact. PSYCH:  Cognitively intact      LABORATORY DATA: Ecg today shows NSR with rate 57. Old inferior infarct. LAD.  QTc is normal at 447 msec. I have personally reviewed and interpreted this study.  Lab Results  Component Value Date   WBC 10.5 02/24/2016   HGB 14.9 02/24/2016   HCT 44.2 02/24/2016   PLT 235 02/24/2016   GLUCOSE 197 (H) 01/05/2017   CHOL 126 01/05/2017   TRIG 208 (H) 01/05/2017   HDL 32 (L) 01/05/2017   LDLCALC 67 01/05/2017   ALT 14 01/05/2017   AST 16 01/05/2017   NA 138 01/05/2017   K 4.9  01/05/2017   CL 101 01/05/2017   CREATININE 0.93 01/05/2017   BUN 20 01/05/2017   CO2 29 01/05/2017   TSH 3.600 08/26/2017   INR 1.10 10/05/2011   HGBA1C 7.4 (H) 08/26/2017   MICROALBUR 50 08/04/2016     Assessment / Plan: 1. Coronary disease with multiple interventions as noted in history of present illness. Last cath in 2013 showed nonobstructive disease. He is asymptomatic. Continue aspirin and statin therapy. Not on beta blocker since he is on Betapace.  2. Ventricular tachycardia. This is well controlled with sotalol therapy. QTC is normal. He is asymptomatic. Follow up renal function and electrolytes.   3. Dyslipidemia. Continue Lipitor high dose. Will make sure he gets lab work today.  4. Diabetes mellitus type 2. Will check A1c.   5. Hypothyroidism. On replacement therapy. Check TSH    I'll followup again in one year.Marland Kitchen

## 2019-02-08 ENCOUNTER — Encounter: Payer: Self-pay | Admitting: Cardiology

## 2019-02-08 ENCOUNTER — Other Ambulatory Visit: Payer: Self-pay

## 2019-02-08 ENCOUNTER — Ambulatory Visit (INDEPENDENT_AMBULATORY_CARE_PROVIDER_SITE_OTHER): Payer: 59 | Admitting: Cardiology

## 2019-02-08 VITALS — BP 100/62 | HR 57 | Ht 70.0 in | Wt 190.8 lb

## 2019-02-08 DIAGNOSIS — I472 Ventricular tachycardia: Secondary | ICD-10-CM

## 2019-02-08 DIAGNOSIS — I2510 Atherosclerotic heart disease of native coronary artery without angina pectoris: Secondary | ICD-10-CM

## 2019-02-08 DIAGNOSIS — M109 Gout, unspecified: Secondary | ICD-10-CM

## 2019-02-08 DIAGNOSIS — I4729 Other ventricular tachycardia: Secondary | ICD-10-CM

## 2019-02-08 DIAGNOSIS — Z23 Encounter for immunization: Secondary | ICD-10-CM

## 2019-02-08 DIAGNOSIS — E1129 Type 2 diabetes mellitus with other diabetic kidney complication: Secondary | ICD-10-CM | POA: Diagnosis not present

## 2019-02-08 DIAGNOSIS — E7800 Pure hypercholesterolemia, unspecified: Secondary | ICD-10-CM

## 2019-02-08 DIAGNOSIS — R809 Proteinuria, unspecified: Secondary | ICD-10-CM

## 2019-02-08 MED ORDER — NITROGLYCERIN 0.4 MG SL SUBL: 0.4000 mg | SUBLINGUAL_TABLET | SUBLINGUAL | 6 refills | Status: DC | PRN

## 2019-02-08 NOTE — Patient Instructions (Signed)
Medication Instructions:  Continue same medications *If you need a refill on your cardiac medications before your next appointment, please call your pharmacy*  Lab Work: Cmet,lipid panel,a1c,tsh,psa,cbc,uric acid today   Testing/Procedures: None ordered  Follow-Up: At [facility], you and your health needs are our priority.  As part of our continuing mission to provide you with exceptional heart care, we have created designated Provider Care Teams.  These Care Teams include your primary Cardiologist (physician) and Advanced Practice Providers (APPs -  Physician Assistants and Nurse Practitioners) who all work together to provide you with the care you need, when you need it.  Your next appointment:  1 year    Call in August to schedule Nov appointment    The format for your next appointment:  Office  Provider:  Dr.Jordan

## 2019-02-09 ENCOUNTER — Other Ambulatory Visit: Payer: Self-pay

## 2019-02-09 ENCOUNTER — Telehealth: Payer: Self-pay | Admitting: Cardiology

## 2019-02-09 DIAGNOSIS — Z79899 Other long term (current) drug therapy: Secondary | ICD-10-CM

## 2019-02-09 LAB — CBC WITH DIFFERENTIAL/PLATELET
Basophils Absolute: 0.1 10*3/uL (ref 0.0–0.2)
Basos: 1 %
EOS (ABSOLUTE): 0.5 10*3/uL — ABNORMAL HIGH (ref 0.0–0.4)
Eos: 5 %
Hematocrit: 44.2 % (ref 37.5–51.0)
Hemoglobin: 15.8 g/dL (ref 13.0–17.7)
Immature Grans (Abs): 0 10*3/uL (ref 0.0–0.1)
Immature Granulocytes: 0 %
Lymphocytes Absolute: 2.5 10*3/uL (ref 0.7–3.1)
Lymphs: 30 %
MCH: 34.2 pg — ABNORMAL HIGH (ref 26.6–33.0)
MCHC: 35.7 g/dL (ref 31.5–35.7)
MCV: 96 fL (ref 79–97)
Monocytes Absolute: 0.8 10*3/uL (ref 0.1–0.9)
Monocytes: 10 %
Neutrophils Absolute: 4.5 10*3/uL (ref 1.4–7.0)
Neutrophils: 54 %
Platelets: 224 10*3/uL (ref 150–450)
RBC: 4.62 x10E6/uL (ref 4.14–5.80)
RDW: 12.3 % (ref 11.6–15.4)
WBC: 8.4 10*3/uL (ref 3.4–10.8)

## 2019-02-09 LAB — COMPREHENSIVE METABOLIC PANEL
ALT: 13 IU/L (ref 0–44)
AST: 13 IU/L (ref 0–40)
Albumin/Globulin Ratio: 1.8 (ref 1.2–2.2)
Albumin: 4.5 g/dL (ref 3.8–4.9)
Alkaline Phosphatase: 97 IU/L (ref 39–117)
BUN/Creatinine Ratio: 21 (ref 10–24)
BUN: 22 mg/dL (ref 8–27)
Bilirubin Total: 0.4 mg/dL (ref 0.0–1.2)
CO2: 24 mmol/L (ref 20–29)
Calcium: 10 mg/dL (ref 8.6–10.2)
Chloride: 100 mmol/L (ref 96–106)
Creatinine, Ser: 1.03 mg/dL (ref 0.76–1.27)
GFR calc Af Amer: 91 mL/min/{1.73_m2} (ref >59)
GFR calc non Af Amer: 79 mL/min/{1.73_m2} (ref >59)
Globulin, Total: 2.5 g/dL (ref 1.5–4.5)
Glucose: 179 mg/dL — ABNORMAL HIGH (ref 65–99)
Potassium: 5.8 mmol/L (ref 3.5–5.2)
Sodium: 142 mmol/L (ref 134–144)
Total Protein: 7 g/dL (ref 6.0–8.5)

## 2019-02-09 LAB — LIPID PANEL
Chol/HDL Ratio: 3.6 ratio (ref 0.0–5.0)
Cholesterol, Total: 137 mg/dL (ref 100–199)
HDL: 38 mg/dL — ABNORMAL LOW (ref >39)
LDL Chol Calc (NIH): 73 mg/dL (ref 0–99)
Triglycerides: 148 mg/dL (ref 0–149)
VLDL Cholesterol Cal: 26 mg/dL (ref 5–40)

## 2019-02-09 LAB — HEMOGLOBIN A1C
Est. average glucose Bld gHb Est-mCnc: 171 mg/dL
Hgb A1c MFr Bld: 7.6 % — ABNORMAL HIGH (ref 4.8–5.6)

## 2019-02-09 LAB — TSH: TSH: 2.97 u[IU]/mL (ref 0.450–4.500)

## 2019-02-09 LAB — PSA: Prostate Specific Ag, Serum: 1 ng/mL (ref 0.0–4.0)

## 2019-02-09 LAB — URIC ACID: Uric Acid: 4.3 mg/dL (ref 3.7–8.6)

## 2019-02-09 NOTE — Telephone Encounter (Signed)
Called by [employer] about critical lab value of K 5.8. Will call patient to have him repeat BMP this morning. Will alert ordering physician, Dr. [name].   Rian Koon K. Marletta Lor, MD

## 2019-02-10 ENCOUNTER — Other Ambulatory Visit: Payer: Self-pay | Admitting: Cardiology

## 2019-02-12 ENCOUNTER — Other Ambulatory Visit: Payer: Self-pay

## 2019-02-12 DIAGNOSIS — E875 Hyperkalemia: Secondary | ICD-10-CM

## 2019-02-12 NOTE — Progress Notes (Signed)
bmet  

## 2019-02-14 LAB — BASIC METABOLIC PANEL
BUN/Creatinine Ratio: 20 (ref 10–24)
BUN: 21 mg/dL (ref 8–27)
CO2: 24 mmol/L (ref 20–29)
Calcium: 9 mg/dL (ref 8.6–10.2)
Chloride: 103 mmol/L (ref 96–106)
Creatinine, Ser: 1.07 mg/dL (ref 0.76–1.27)
GFR calc Af Amer: 87 mL/min/{1.73_m2} (ref >59)
GFR calc non Af Amer: 75 mL/min/{1.73_m2} (ref >59)
Glucose: 206 mg/dL — ABNORMAL HIGH (ref 65–99)
Potassium: 5.3 mmol/L — ABNORMAL HIGH (ref 3.5–5.2)
Sodium: 142 mmol/L (ref 134–144)

## 2019-04-12 NOTE — Progress Notes (Deleted)
Patient: [name] Male    DOB: 03-28-1960   59 y.o.   MRN: [id number] Visit Date: 04/12/2019  Today's Provider: [name], MD   No chief complaint on file.  Subjective:     HPI   Type 2 diabetes mellitus with microalbuminuria, without long-term current use of insulin ([place]) From 04/11/2018-labs ordered, but zero done. Last Hemoglobin A1c in chart 02/08/2019 7.6.  Other hyperlipidemia From 04/11/2018-labs ordered, but zero done. Labs checked through cardiology 02/08/2019.  Essential hypertension From 04/11/2018-labs ordered, but zero done. Labs checked through cardiology 02/08/2019.  Hypothyroidism, unspecified type From 04/11/2018-labs ordered, but zero done. Labs checked through cardiology 02/08/2019.  Recurrent major depressive disorder, in partial remission ([place]) From 04/11/2018-no changes were made at this visit.  Allergies  Allergen Reactions  . Crestor [Rosuvastatin Calcium]     ITCHING AND PALPITATIONS  . Penicillins     As a child; took as adult and no reaction.     Current Outpatient Medications:  .  amphetamine-dextroamphetamine (ADDERALL) 20 MG tablet, Take by mouth., Disp: , Rfl:  .  aspirin 81 MG tablet, Take by mouth., Disp: , Rfl:  .  atorvastatin (LIPITOR) 80 MG tablet, TAKE 1 TABLET BY MOUTH  DAILY AT 6PM, Disp: 90 tablet, Rfl: 3 .  lamoTRIgine (LAMICTAL) 25 MG tablet, Take by mouth., Disp: , Rfl:  .  levothyroxine (SYNTHROID) 150 MCG tablet, TAKE 1 TABLET BY MOUTH  DAILY BEFORE BREAKFAST, Disp: 90 tablet, Rfl: 3 .  nitroGLYCERIN (NITROSTAT) 0.4 MG SL tablet, Place 1 tablet (0.4 mg total) under the tongue every 5 (five) minutes x 3 doses as needed for chest pain., Disp: 25 tablet, Rfl: 6 .  pioglitazone-metformin (ACTOPLUS MET) 15-850 MG tablet, TAKE 1 TABLET BY MOUTH TWICE A DAY, Disp: 60 tablet, Rfl: 11 .  ramipril (ALTACE) 2.5 MG capsule, TAKE 1 CAPSULE BY MOUTH  DAILY, Disp: 90 capsule, Rfl: 3 .  sertraline (ZOLOFT) 100 MG  tablet, Take by mouth., Disp: , Rfl:  .  sotalol (BETAPACE) 120 MG tablet, TAKE 1 TABLET BY MOUTH TWO  TIMES DAILY, Disp: 180 tablet, Rfl: 3 .  temazepam (RESTORIL) 30 MG capsule, Take by mouth., Disp: , Rfl:   Review of Systems  Constitutional: Negative for appetite change, chills and fever.  Respiratory: Negative for chest tightness, shortness of breath and wheezing.   Cardiovascular: Negative for chest pain and palpitations.  Gastrointestinal: Negative for abdominal pain, nausea and vomiting.    Social History   Tobacco Use  . Smoking status: Former Smoker    Quit date: 10/04/1997    Years since quitting: 21.5  . Smokeless tobacco: Current User    Types: Snuff  Substance Use Topics  . Alcohol use: No      Objective:   There were no vitals taken for this visit. There were no vitals filed for this visit.There is no height or weight on file to calculate BMI.   Physical Exam   No results found for any visits on 04/16/19.     Assessment & Plan        [name], MD  [facility]

## 2019-04-16 ENCOUNTER — Ambulatory Visit: Payer: Self-pay | Admitting: Family Medicine

## 2019-04-26 NOTE — Progress Notes (Signed)
[name]  MRN: [id number] DOB: February 19, 1961  Subjective:  HPI   Patient is a 59 year old male who presents for 4 month follow up of chronic health issues.  He was last seen on 12/13/18 for his annual physical.  Routine labs which included Met C, CBC, Lipids and TSH were done as well as Uric Acid, PSA, and A1C.  The patient is up to date on his Focus Metrics.  His Health Maintenance is in need of Diabetic foot and eye exam, HIV Screening and PPSV23 injection.    1. Essential (primary) hypertension  BP Readings from Last 3 Encounters:  02/08/19 100/62  12/13/18 134/70  04/11/18 132/80   Wt Readings from Last 3 Encounters:  02/08/19 190 lb 12.8 oz (86.5 kg)  12/13/18 189 lb (85.7 kg)  04/11/18 201 lb (91.2 kg)   2. Adult hypothyroidism  Lab Results  Component Value Date   TSH 2.970 02/08/2019    3. Type 2 diabetes mellitus with microalbuminuria, without long-term current use of insulin (HCC)  Lab Results  Component Value Date   HGBA1C 7.6 (H) 02/08/2019    4. Recurrent major depressive disorder, in partial remission [facility])  Depression screen [facility] 2/9 12/13/2018 12/15/2016  Decreased Interest 2 0  Down, Depressed, Hopeless 0 0  PHQ - 2 Score 2 0  Altered sleeping 1 -  Tired, decreased energy 1 -  Change in appetite 0 -  Feeling bad or failure about yourself  0 -  Trouble concentrating 0 -  Moving slowly or fidgety/restless 0 -  Suicidal thoughts 0 -  PHQ-9 Score 4 -  Difficult doing work/chores Somewhat difficult -      Patient Active Problem List   Diagnosis Date Noted  . Gout 10/21/2014  . 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 ADD (attention deficit disorder) 08/26/2014  . Multiple blisters 08/26/2014  . Clinical depression 08/26/2014  . Diabetes mellitus, type 2 ([place]) 08/26/2014  . Essential (primary) hypertension 08/26/2014  . Anxiety, generalized 08/26/2014  . High potassium 08/26/2014  . HPTH (hyperparathyroidism) ([place]) 08/26/2014  . BP (high blood  pressure) 08/26/2014  . Bug bite with infection 08/26/2014  . Injury, shoulder and upper arm 08/26/2014  . Cannot sleep 08/26/2014  . Calculus of kidney 08/26/2014  . Depression, major, recurrent, mild ([place]) 08/26/2014  . Bursitis of elbow 08/26/2014  . Affective bipolar disorder ([place]) 08/26/2014  . Adult hypothyroidism 08/26/2014  . Chest pain 10/06/2011  . Type 2 diabetes mellitus ([place]) 10/05/2011  . Hypothyroidism 10/05/2011  . Anxiety 10/05/2011  . CAD (coronary artery disease) 06/29/2011  . Old MI (myocardial infarction) 06/29/2011  . Hyperlipidemia 06/29/2011  . 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 Arthritis   . Coronary artery disease   . Diabetes mellitus   . Dyslipidemia   . Gout   . History of tobacco use   . Hypertension   . Hypothyroidism   . Old inferior myocardial infarction   . Parathyroid adenoma   . Sleep apnea    does not use cpap  . Thyroid disease   . Ventricular tachycardia (HCC)    Symptomatic ventricular tachycardia    Social History   Socioeconomic History  . Marital status: Married    Spouse name: Not on file  . Number of children: 3  . Years of education: Not on file  . Highest education level: Not on file  Occupational History  . 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 Smoking status: Former Smoker    Quit date: 10/04/1997    Years since quitting: 21.5  . Smokeless tobacco: Current User    Types: Snuff  Substance and Sexual Activity  . Alcohol use: No  . Drug use: No  . Sexual activity: Yes  Other Topics Concern  . Not on file  Social History Narrative   Lives in [place], [place] with wife.    Social Determinants of Health   Financial Resource Strain:   . Difficulty of Paying Living Expenses: Not on file  Food Insecurity:   . Worried About Charity fundraiser in the Last Year: Not on file  . Ran Out of Food in the Last Year: Not on file  Transportation Needs:    . Lack of Transportation (Medical): Not on file  . Lack of Transportation (Non-Medical): Not on file  Physical Activity:   . Days of Exercise per Week: Not on file  . Minutes of Exercise per Session: Not on file  Stress:   . Feeling of Stress : Not on file  Social Connections:   . Frequency of Communication with Friends and Family: Not on file  . Frequency of Social Gatherings with Friends and Family: Not on file  . Attends Religious Services: Not on file  . Active Member of Clubs or Organizations: Not on file  . Attends Archivist Meetings: Not on file  . Marital Status: Not on file  Intimate Partner Violence:   . Fear of Current or Ex-Partner: Not on file  . Emotionally Abused: Not on file  . Physically Abused: Not on file  . Sexually Abused: Not on file    Outpatient Encounter Medications as of 04/27/2019  Medication Sig Note  . amphetamine-dextroamphetamine (ADDERALL) 20 MG tablet Take by mouth. 08/26/2014: for November Received from: [facility]  . aspirin 81 MG tablet Take by mouth. 08/26/2014: Received from: [facility]  . atorvastatin (LIPITOR) 80 MG tablet TAKE 1 TABLET BY MOUTH  DAILY AT 6PM   . lamoTRIgine (LAMICTAL) 25 MG tablet Take by mouth. 08/26/2014: Received from: [facility]  . levothyroxine (SYNTHROID) 150 MCG tablet TAKE 1 TABLET BY MOUTH  DAILY BEFORE BREAKFAST   . nitroGLYCERIN (NITROSTAT) 0.4 MG SL tablet Place 1 tablet (0.4 mg total) under the tongue every 5 (five) minutes x 3 doses as needed for chest pain.   . pioglitazone-metformin (ACTOPLUS MET) 15-850 MG tablet TAKE 1 TABLET BY MOUTH TWICE A DAY   . ramipril (ALTACE) 2.5 MG capsule TAKE 1 CAPSULE BY MOUTH  DAILY   . sertraline (ZOLOFT) 100 MG tablet Take by mouth. 08/26/2014: Received from: [facility]  . sotalol (BETAPACE) 120 MG tablet TAKE 1 TABLET BY MOUTH TWO  TIMES DAILY   . temazepam (RESTORIL) 30 MG capsule Take by mouth.  08/26/2014: Received from: [facility]   No facility-administered encounter medications on file as of 04/27/2019.    Allergies  Allergen Reactions  . Crestor [Rosuvastatin Calcium]     ITCHING AND PALPITATIONS  . Penicillins     As a child; took as adult and no reaction.    ROS  Objective:  There were no vitals taken for this visit.  Physical Exam  Assessment and Plan :      HPI, Exam and A&P Transcribed under the direction and in the presence of [name]., MD. Electronically Signed: Althea Charon, [place]

## 2019-04-27 ENCOUNTER — Encounter: Payer: 59 | Admitting: Family Medicine

## 2019-05-15 ENCOUNTER — Other Ambulatory Visit: Payer: Self-pay

## 2019-05-15 ENCOUNTER — Ambulatory Visit (INDEPENDENT_AMBULATORY_CARE_PROVIDER_SITE_OTHER): Payer: 59 | Admitting: Family Medicine

## 2019-05-15 ENCOUNTER — Encounter: Payer: Self-pay | Admitting: Family Medicine

## 2019-05-15 VITALS — BP 124/83 | HR 62 | Temp 98.2°F | Wt 195.8 lb

## 2019-05-15 DIAGNOSIS — E1129 Type 2 diabetes mellitus with other diabetic kidney complication: Secondary | ICD-10-CM | POA: Diagnosis not present

## 2019-05-15 DIAGNOSIS — I2510 Atherosclerotic heart disease of native coronary artery without angina pectoris: Secondary | ICD-10-CM

## 2019-05-15 DIAGNOSIS — R809 Proteinuria, unspecified: Secondary | ICD-10-CM

## 2019-05-15 DIAGNOSIS — E213 Hyperparathyroidism, unspecified: Secondary | ICD-10-CM

## 2019-05-15 DIAGNOSIS — I10 Essential (primary) hypertension: Secondary | ICD-10-CM

## 2019-05-15 DIAGNOSIS — E039 Hypothyroidism, unspecified: Secondary | ICD-10-CM

## 2019-05-15 DIAGNOSIS — E7849 Other hyperlipidemia: Secondary | ICD-10-CM

## 2019-05-15 DIAGNOSIS — F330 Major depressive disorder, recurrent, mild: Secondary | ICD-10-CM

## 2019-05-15 DIAGNOSIS — E1169 Type 2 diabetes mellitus with other specified complication: Secondary | ICD-10-CM | POA: Diagnosis not present

## 2019-05-15 LAB — POCT GLYCOSYLATED HEMOGLOBIN (HGB A1C)
Estimated Average Glucose: 166
Hemoglobin A1C: 7.4 % — AB (ref 4.0–5.6)

## 2019-05-15 LAB — POCT UA - MICROALBUMIN: Microalbumin Ur, POC: 50 mg/L

## 2019-05-15 NOTE — Progress Notes (Signed)
Patient: [name] Male    DOB: 06/10/1960   59 y.o.   MRN: [id number] Visit Date: 05/15/2019  Today's Provider: [name], MD   Chief Complaint  Patient presents with  . Diabetes Mellitus   Subjective:     HPI   Type 2 diabetes mellitus with microalbuminuria, without long-term current use of insulin (HCC) Hemoglobin A1c last checked 02/08/2019--7.6.  Allergies  Allergen Reactions  . Crestor [Rosuvastatin Calcium]     ITCHING AND PALPITATIONS  . Penicillins     As a child; took as adult and no reaction.     Current Outpatient Medications:  .  amphetamine-dextroamphetamine (ADDERALL) 20 MG tablet, Take by mouth., Disp: , Rfl:  .  aspirin 81 MG tablet, Take by mouth., Disp: , Rfl:  .  atorvastatin (LIPITOR) 80 MG tablet, TAKE 1 TABLET BY MOUTH  DAILY AT 6PM, Disp: 90 tablet, Rfl: 3 .  lamoTRIgine (LAMICTAL) 25 MG tablet, Take by mouth., Disp: , Rfl:  .  levothyroxine (SYNTHROID) 150 MCG tablet, TAKE 1 TABLET BY MOUTH  DAILY BEFORE BREAKFAST, Disp: 90 tablet, Rfl: 3 .  nitroGLYCERIN (NITROSTAT) 0.4 MG SL tablet, Place 1 tablet (0.4 mg total) under the tongue every 5 (five) minutes x 3 doses as needed for chest pain., Disp: 25 tablet, Rfl: 6 .  pioglitazone-metformin (ACTOPLUS MET) 15-850 MG tablet, TAKE 1 TABLET BY MOUTH TWICE A DAY, Disp: 60 tablet, Rfl: 11 .  ramipril (ALTACE) 2.5 MG capsule, TAKE 1 CAPSULE BY MOUTH  DAILY, Disp: 90 capsule, Rfl: 3 .  sertraline (ZOLOFT) 100 MG tablet, Take by mouth., Disp: , Rfl:  .  sotalol (BETAPACE) 120 MG tablet, TAKE 1 TABLET BY MOUTH TWO  TIMES DAILY, Disp: 180 tablet, Rfl: 3 .  temazepam (RESTORIL) 30 MG capsule, Take by mouth., Disp: , Rfl:   Review of Systems  Constitutional: Negative.  Negative for appetite change, chills and fever.  HENT: Negative.   Eyes: Negative.   Respiratory: Negative.  Negative for chest tightness, shortness of breath and wheezing.   Cardiovascular: Negative.  Negative for chest pain  and palpitations.  Gastrointestinal: Negative.  Negative for abdominal pain, nausea and vomiting.  Endocrine: Negative.   Genitourinary: Negative.   Musculoskeletal: Negative.   Skin: Negative.   Allergic/Immunologic: Negative.   Neurological: Negative.   Hematological: Negative.   Psychiatric/Behavioral: Negative.     Social History   Tobacco Use  . Smoking status: Former Smoker    Quit date: 10/04/1997    Years since quitting: 21.6  . Smokeless tobacco: Current User    Types: Snuff  Substance Use Topics  . Alcohol use: No      Objective:   There were no vitals taken for this visit. There were no vitals filed for this visit.There is no height or weight on file to calculate BMI.   Physical Exam Vitals reviewed.  Constitutional:      Appearance: He is well-developed.  HENT:     Head: Normocephalic and atraumatic.     Right Ear: External ear normal.     Left Ear: External ear normal.     Nose: Nose normal.  Eyes:     General: No scleral icterus.    Conjunctiva/sclera: Conjunctivae normal.  Neck:     Thyroid: No thyromegaly.  Cardiovascular:     Rate and Rhythm: Normal rate and regular rhythm.     Heart sounds: Normal heart sounds.  Pulmonary:     Effort:  Pulmonary effort is normal.     Breath sounds: Normal breath sounds.  Abdominal:     Palpations: Abdomen is soft.  Musculoskeletal:        General: Normal range of motion.     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  Skin:    General: Skin is warm and dry.  Neurological:     General: No focal deficit present.     Mental Status: He is alert and oriented to person, place, and time.     Cranial Nerves: No cranial nerve deficit.     Motor: No abnormal muscle tone.     Coordination: Coordination normal.     Comments: Diabetic foot exam normal.  Psychiatric:        Mood and Affect: Mood normal.          Behavior: Behavior normal.        Thought Content: Thought content normal.        Judgment: Judgment normal.      No results found for any visits on 05/15/19.     Assessment & Plan      1. Type 2 diabetes mellitus with other specified complication, without long-term current use of insulin (HCC) A1c is 7.4 today.  No changes.  Patient doing well on Metformin and pioglitazone. - POCT UA - Microalbumin  2. Type 2 diabetes mellitus with microalbuminuria, without long-term current use of insulin (HCC)  - POCT HgB A1C  3. Essential (primary) hypertension Good control on ramipril  4. Coronary artery disease involving native coronary artery of native heart without angina pectoris Risk factors treated.  5. Hypothyroidism, unspecified type   6. HPTH (hyperparathyroidism) ([place]) That is post parathyroidectomy  7. Other hyperlipidemia On atorvastatin.  8. Depression, major, recurrent, mild ([place]) Per Dr. Nicolasa Ducking.  This is been stable for several years.  Will follow with Dr. Nicolasa Ducking.   Trena Platt Kear[place] Gayden,acting as a scribe for [name], MD.,have documented all relevant documentation on the behalf of [name], MD,as directed by  [name], MD while in the presence of [name], MD.     [name], MD  [facility]

## 2019-05-28 ENCOUNTER — Ambulatory Visit: Payer: 59 | Attending: Internal Medicine

## 2019-05-28 DIAGNOSIS — Z20822 Contact with and (suspected) exposure to covid-19: Secondary | ICD-10-CM

## 2019-05-29 LAB — NOVEL CORONAVIRUS, NAA: SARS-CoV-2, NAA: NOT DETECTED

## 2019-06-23 ENCOUNTER — Other Ambulatory Visit: Payer: Self-pay | Admitting: Family Medicine

## 2019-06-23 NOTE — Telephone Encounter (Signed)
Requested Prescriptions  Pending Prescriptions Disp Refills  . levothyroxine (SYNTHROID) 150 MCG tablet [Pharmacy Med Name: MYLAN-LEVOTHYROX 150MCG TABLET] 90 tablet 2    Sig: TAKE 1 TABLET BY MOUTH  DAILY BEFORE BREAKFAST     Endocrinology:  Hypothyroid Agents Failed - 06/23/2019 10:06 PM      Failed - TSH needs to be rechecked within 3 months after an abnormal result. Refill until TSH is due.      Passed - TSH in normal range and within 360 days    TSH  Date Value Ref Range Status  02/08/2019 2.970 0.450 - 4.500 uIU/mL Final         Passed - Valid encounter within last 12 months    Recent Outpatient Visits          1 month ago Type 2 diabetes mellitus with other specified complication, without long-term current use of insulin [facility])   [facility] [name], MD   1 month ago Essential (primary) hypertension   [facility] [name], MD   6 months ago Annual physical exam   [facility] [name], MD   1 year ago Type 2 diabetes mellitus with microalbuminuria, without long-term current use of insulin [facility])   [facility] [name], MD   1 year ago Type 2 diabetes mellitus with complication, without long-term current use of insulin [facility])   [facility] [name], MD      Future Appointments            In 2 months [name], MD [facility]

## 2019-07-16 ENCOUNTER — Other Ambulatory Visit: Payer: Self-pay

## 2019-07-16 ENCOUNTER — Ambulatory Visit: Payer: 59 | Attending: Internal Medicine

## 2019-07-16 DIAGNOSIS — Z23 Encounter for immunization: Secondary | ICD-10-CM

## 2019-07-16 NOTE — Progress Notes (Signed)
   Covid-19 Vaccination Clinic  Name:  [name]    MRN: [id number] DOB: 1960/09/05  07/16/2019  [name] was observed post Covid-19 immunization for 15 minutes without incident. He was provided with Vaccine Information Sheet and instruction to access the V-Safe system.   [name] was instructed to call 911 with any severe reactions post vaccine: Marland Kitchen Difficulty breathing  . Swelling of face and throat  . A fast heartbeat  . A bad rash all over body  . Dizziness and weakness   Immunizations Administered    Name Date Dose VIS Date Route   Pfizer COVID-19 Vaccine 07/16/2019 11:36 AM 0.3 mL 05/16/2018 Intramuscular   Manufacturer: [place]   Lot: [id number]   [place]: [id number]

## 2019-08-07 ENCOUNTER — Ambulatory Visit: Payer: 59 | Attending: Internal Medicine

## 2019-08-07 DIAGNOSIS — Z23 Encounter for immunization: Secondary | ICD-10-CM

## 2019-08-07 NOTE — Progress Notes (Signed)
   Covid-19 Vaccination Clinic  Name:  [name]    MRN: [id number] DOB: [birth year]  08/07/2019  Mr. Karriker was observed post Covid-19 immunization for 15 minutes without incident. He was provided with Vaccine Information Sheet and instruction to access the V-Safe system.   Mr. Nemitz was instructed to call 911 with any severe reactions post vaccine: Marland Kitchen Difficulty breathing  . Swelling of face and throat  . A fast heartbeat  . A bad rash all over body  . Dizziness and weakness   Immunizations Administered    Name Date Dose VIS Date Route   Pfizer COVID-19 Vaccine 08/07/2019 10:39 AM 0.3 mL 05/16/2018 Intramuscular   Manufacturer: [place]   Lot: [id number]   [place]: [id number]

## 2019-09-05 ENCOUNTER — Other Ambulatory Visit: Payer: Self-pay | Admitting: Family Medicine

## 2019-09-05 NOTE — Telephone Encounter (Signed)
Medication Refill - Medication: temazepam (RESTORIL) 30 MG capsule [[id number]]     Preferred Pharmacy (with phone number or street name):  [id number] [place], [address] [place] [address]  Phone: [phone] Fax: [phone]     Agent: Please be advised that RX refills may take up to 3 business days. We ask that you follow-up with your pharmacy.    Pt is currently out of town and running out of meds listed above . He would like a prescription filled at the pharmacy listed above . Please advise

## 2019-09-05 NOTE — Telephone Encounter (Signed)
Please advise? Medication has never been filled by you.  

## 2019-09-06 MED ORDER — TEMAZEPAM 30 MG PO CAPS: 30.0000 mg | ORAL_CAPSULE | Freq: Every evening | ORAL | 1 refills | Status: DC | PRN

## 2019-09-11 NOTE — Progress Notes (Signed)
Trena Platt Cummings,acting as a scribe for [name], MD.,have documented all relevant documentation on the behalf of [name], MD,as directed by  [name], MD while in the presence of [name], MD.  Established patient visit   Patient: [name]   DOB: 07-27-1960   59 y.o. Male  MRN: [id number] Visit Date: 09/12/2019  Today's healthcare provider: [name], MD   Chief Complaint  Patient presents with  . Diabetes Mellitus  . Hypertension   Subjective    HPI patient has been doing well.  He is now completely off Adderall.  He is complaining of some joint stiffness,  Diabetes Mellitus Type II, follow-up  Lab Results  Component Value Date   HGBA1C 7.4 (A) 05/15/2019   HGBA1C 7.6 (H) 02/08/2019   HGBA1C 7.4 (H) 08/26/2017   Last seen for diabetes 4 months ago.  Management since then includes continuing the same treatment. He reports good compliance with treatment. He is not having side effects.   Home blood sugar records: has not checked  Episodes of hypoglycemia? No    Current insulin regiment: none Most Recent Eye Exam: overdue  --------------------------------------------------------------------------------------------------- Hypertension, follow-up  BP Readings from Last 3 Encounters:  09/12/19 (!) 142/89  05/15/19 124/83  02/08/19 100/62   Wt Readings from Last 3 Encounters:  09/12/19 199 lb 6.4 oz (90.4 kg)  05/15/19 195 lb 12.8 oz (88.8 kg)  02/08/19 190 lb 12.8 oz (86.5 kg)     He was last seen for hypertension 4 months ago.  BP at that visit was 124/83. Management since that visit includes; Good control on ramipril. He reports excellent compliance with treatment. He is not having side effects.  He is exercising. He is adherent to low salt diet.   Outside blood pressures are not being checked.  He does not smoke.  Use of agents associated with hypertension: NSAIDS.    ---------------------------------------------------------------------------------------------------   Social History   Tobacco Use  . Smoking status: Former Smoker    Quit date: 10/04/1997    Years since quitting: 21.9  . Smokeless tobacco: Current User    Types: Snuff  Vaping Use  . Vaping Use: Never used  Substance Use Topics  . Alcohol use: No  . Drug use: No       Medications: Outpatient Medications Prior to Visit  Medication Sig  . aspirin 81 MG tablet Take by mouth.  Marland Kitchen atorvastatin (LIPITOR) 80 MG tablet TAKE 1 TABLET BY MOUTH  DAILY AT 6PM  . lamoTRIgine (LAMICTAL) 25 MG tablet Take by mouth.  . levothyroxine (SYNTHROID) 150 MCG tablet TAKE 1 TABLET BY MOUTH  DAILY BEFORE BREAKFAST  . nitroGLYCERIN (NITROSTAT) 0.4 MG SL tablet Place 1 tablet (0.4 mg total) under the tongue every 5 (five) minutes x 3 doses as needed for chest pain.  . pioglitazone-metformin (ACTOPLUS MET) 15-850 MG tablet TAKE 1 TABLET BY MOUTH TWICE A DAY  . ramipril (ALTACE) 2.5 MG capsule TAKE 1 CAPSULE BY MOUTH  DAILY  . sertraline (ZOLOFT) 100 MG tablet Take by mouth.  . sotalol (BETAPACE) 120 MG tablet TAKE 1 TABLET BY MOUTH TWO  TIMES DAILY  . temazepam (RESTORIL) 30 MG capsule Take 1 capsule (30 mg total) by mouth at bedtime as needed for sleep.  Marland Kitchen amphetamine-dextroamphetamine (ADDERALL) 20 MG tablet Take by mouth. (Patient not taking: Reported on 09/12/2019)   No facility-administered medications prior to visit.    Review of Systems  Constitutional: Negative  for appetite change, chills and fever.  Eyes: Negative.   Respiratory: Negative for chest tightness, shortness of breath and wheezing.   Cardiovascular: Negative for chest pain and palpitations.  Gastrointestinal: Negative for abdominal pain, nausea and vomiting.  Endocrine: Negative.   Musculoskeletal: Positive for arthralgias.  Allergic/Immunologic: Negative.   Neurological: Negative.   Psychiatric/Behavioral: Negative.         Objective    BP (!) 142/89 (BP Location: Left Arm, Patient Position: Sitting, Cuff Size: Normal)   Pulse (!) 59   Temp (!) 96.9 F (36.1 C) (Temporal)   Ht 5' 10"  (1.778 m)   Wt 199 lb 6.4 oz (90.4 kg)   BMI 28.61 kg/m  BP Readings from Last 3 Encounters:  09/12/19 (!) 142/89  05/15/19 124/83  02/08/19 100/62   Wt Readings from Last 3 Encounters:  09/12/19 199 lb 6.4 oz (90.4 kg)  05/15/19 195 lb 12.8 oz (88.8 kg)  02/08/19 190 lb 12.8 oz (86.5 kg)      Physical Exam Vitals reviewed.  Constitutional:      Appearance: He is well-developed.  HENT:     Head: Normocephalic and atraumatic.     Right Ear: External ear normal.     Left Ear: External ear normal.     Nose: Nose normal.  Eyes:     General: No scleral icterus.    Conjunctiva/sclera: Conjunctivae normal.  Neck:     Thyroid: No thyromegaly.  Cardiovascular:     Rate and Rhythm: Normal rate and regular rhythm.     Heart sounds: Normal heart sounds.  Pulmonary:     Effort: Pulmonary effort is normal.     Breath sounds: Normal breath sounds.  Abdominal:     Palpations: Abdomen is soft.  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  Skin:    General: Skin is warm and dry.  Neurological:     General: No focal deficit present.     Mental Status: He is alert and oriented to person, place, and time.     Cranial Nerves: No cranial nerve deficit.     Motor: No abnormal muscle tone.     Coordination: Coordination normal.     Comments: Diabetic foot exam normal.  Psychiatric:        Mood and Affect: Mood normal.        Behavior: Behavior normal.        Thought Content: Thought content normal.        Judgment: Judgment normal.       No results found for any visits on 09/12/19.  Assessment & Plan     1. Type 2 diabetes mellitus with microalbuminuria, without long-term current  use of insulin (HCC) A1c is 7.2.  Good control diabetes.  2. Type 2 diabetes mellitus with other specified complication, without long-term current use of insulin (HCC)  - POCT HgB A1C  3. Essential hypertension Get blood pressure cuff for the house and bring in those readings on next visit.  4. Coronary artery disease involving native coronary artery of native heart without angina pectoris All risk factors treated.  5. Primary osteoarthritis, unspecified site Avoid anti-inflammatories.  Over-the-counter turmeric and glucosamine   No follow-ups on file.      I, [name], MD, have reviewed all documentation for this visit. The documentation on 09/16/19 for the exam, diagnosis, procedures, and orders are  all accurate and complete.    Rockey Guarino Cranford Mon, MD  [facility] [phone] (phone) [phone] (fax)  [place]

## 2019-09-12 ENCOUNTER — Encounter: Payer: Self-pay | Admitting: Family Medicine

## 2019-09-12 ENCOUNTER — Other Ambulatory Visit: Payer: Self-pay

## 2019-09-12 ENCOUNTER — Ambulatory Visit (INDEPENDENT_AMBULATORY_CARE_PROVIDER_SITE_OTHER): Payer: 59 | Admitting: Family Medicine

## 2019-09-12 VITALS — BP 142/89 | HR 59 | Temp 96.9°F | Ht 70.0 in | Wt 199.4 lb

## 2019-09-12 DIAGNOSIS — E1169 Type 2 diabetes mellitus with other specified complication: Secondary | ICD-10-CM

## 2019-09-12 DIAGNOSIS — E1129 Type 2 diabetes mellitus with other diabetic kidney complication: Secondary | ICD-10-CM

## 2019-09-12 DIAGNOSIS — I10 Essential (primary) hypertension: Secondary | ICD-10-CM

## 2019-09-12 DIAGNOSIS — M1991 Primary osteoarthritis, unspecified site: Secondary | ICD-10-CM

## 2019-09-12 DIAGNOSIS — I2510 Atherosclerotic heart disease of native coronary artery without angina pectoris: Secondary | ICD-10-CM

## 2019-09-12 DIAGNOSIS — R809 Proteinuria, unspecified: Secondary | ICD-10-CM

## 2019-09-12 LAB — POCT GLYCOSYLATED HEMOGLOBIN (HGB A1C)
Estimated Average Glucose: 160
Hemoglobin A1C: 7.2 % — AB (ref 4.0–5.6)

## 2019-09-12 NOTE — Patient Instructions (Signed)
TRY OTC TUMERIC AND GLUCOSAMINE!!!  CHECK BP AT HOME!!!

## 2019-09-13 ENCOUNTER — Other Ambulatory Visit: Payer: Self-pay | Admitting: Family Medicine

## 2019-09-21 ENCOUNTER — Other Ambulatory Visit: Payer: Self-pay | Admitting: Cardiology

## 2019-09-21 MED ORDER — NITROGLYCERIN 0.4 MG SL SUBL: 0.4000 mg | SUBLINGUAL_TABLET | SUBLINGUAL | 4 refills | Status: DC | PRN

## 2019-09-21 NOTE — Telephone Encounter (Signed)
   *  STAT* If patient is at the pharmacy, call can be transferred to refill team.   1. Which medications need to be refilled? (please list name of each medication and dose if known)   nitroGLYCERIN (NITROSTAT) 0.4 MG SL tablet     2. Which pharmacy/location (including street and city if local pharmacy) is medication to be sent to?[id number] [address], [place]  3. Do they need a 30 day or 90 day supply? 1 bottle

## 2019-11-03 ENCOUNTER — Other Ambulatory Visit: Payer: Self-pay | Admitting: Family Medicine

## 2019-11-03 NOTE — Telephone Encounter (Signed)
Requested Prescriptions  Pending Prescriptions Disp Refills   atorvastatin (LIPITOR) 80 MG tablet [Pharmacy Med Name: Atorvastatin Calcium 80 MG Oral Tablet] 90 tablet 0    Sig: TAKE 1 TABLET BY MOUTH  DAILY AT 6PM     Cardiovascular:  Antilipid - Statins Failed - 11/03/2019 10:22 PM      Failed - LDL in normal range and within 360 days    LDL Cholesterol (Calc)  Date Value Ref Range Status  01/05/2017 67 mg/dL (calc) Final    Comment:    Reference range: <100 . Desirable range <100 mg/dL for primary prevention;   <70 mg/dL for patients with CHD or diabetic patients  with > or = 2 CHD risk factors. Marland Kitchen LDL-C is now calculated using the Martin-Hopkins  calculation, which is a validated novel method providing  better accuracy than the Friedewald equation in the  estimation of LDL-C.  Cresenciano Genre et al. Annamaria Helling. 6270;350(09): 2061-2068  (http://education.[employer].com/faq/FAQ164)    LDL Chol Calc (NIH)  Date Value Ref Range Status  02/08/2019 73 0 - 99 mg/dL Final         Failed - HDL in normal range and within 360 days    HDL  Date Value Ref Range Status  02/08/2019 38 (L) >39 mg/dL Final         Passed - Total Cholesterol in normal range and within 360 days    Cholesterol, Total  Date Value Ref Range Status  02/08/2019 137 100 - 199 mg/dL Final         Passed - Triglycerides in normal range and within 360 days    Triglycerides  Date Value Ref Range Status  02/08/2019 148 0 - 149 mg/dL Final         Passed - Patient is not pregnant      Passed - Valid encounter within last 12 months    Recent Outpatient Visits          1 month ago Type 2 diabetes mellitus with microalbuminuria, without long-term current use of insulin [facility])   [facility] [name], MD   5 months ago Type 2 diabetes mellitus with other specified complication, without long-term current use of insulin [facility])   [facility] [name], MD   6  months ago Essential (primary) hypertension   [facility] [name], MD   10 months ago Annual physical exam   [facility] [name], MD   1 year ago Type 2 diabetes mellitus with microalbuminuria, without long-term current use of insulin [facility])   [facility] [name], MD      Future Appointments            In 4 months [name], MD [facility]

## 2019-11-05 ENCOUNTER — Other Ambulatory Visit: Payer: Self-pay | Admitting: Family Medicine

## 2019-11-05 NOTE — Telephone Encounter (Signed)
Requested medication (s) are due for refill today: yes  Requested medication (s) are on the active medication list: yes  Last refill:  09/06/19  Future visit scheduled: yes  Notes to clinic:  med not delegated to NT to RF   Requested Prescriptions  Pending Prescriptions Disp Refills   temazepam (RESTORIL) 30 MG capsule [Pharmacy Med Name: TEMAZEPAM 30 MG CAPSULE] 30 capsule 0    Sig: Take 1 capsule (30 mg total) by mouth at bedtime as needed for sleep.      Not Delegated - Psychiatry:  Anxiolytics/Hypnotics Failed - 11/05/2019  2:34 PM      Failed - This refill cannot be delegated      Failed - Urine Drug Screen completed in last 360 days.      Passed - Valid encounter within last 6 months    Recent Outpatient Visits           1 month ago Type 2 diabetes mellitus with microalbuminuria, without long-term current use of insulin [facility])   [facility] [name], MD   5 months ago Type 2 diabetes mellitus with other specified complication, without long-term current use of insulin [facility])   [facility] [name], MD   6 months ago Essential (primary) hypertension   [facility] [name], MD   10 months ago Annual physical exam   [facility] [name], MD   1 year ago Type 2 diabetes mellitus with microalbuminuria, without long-term current use of insulin [facility])   [facility] [name], MD       Future Appointments             In 4 months [name], MD [facility], [facility]

## 2019-11-06 ENCOUNTER — Other Ambulatory Visit: Payer: Self-pay | Admitting: Family Medicine

## 2019-11-06 NOTE — Telephone Encounter (Signed)
PT need a refill temazepam (RESTORIL) 30 MG capsule [[id number]]  [id number] [address], [place]  [place] [address]  Phone: [phone] Fax: [phone]

## 2019-11-06 NOTE — Addendum Note (Signed)
Addended by: Valli Glance F on: 11/06/2019 08:27 AM   Modules accepted: Orders

## 2019-11-06 NOTE — Telephone Encounter (Signed)
Patient is requesting medication already pended for review- check pharmacy- there may be a change in location request

## 2019-12-09 ENCOUNTER — Other Ambulatory Visit: Payer: Self-pay | Admitting: Family Medicine

## 2019-12-09 NOTE — Telephone Encounter (Signed)
Requested medication (s) are due for refill today: yes  Requested medication (s) are on the active medication list:yes  Last refill: 11/07/2019  Future visit scheduled:yes  Notes to clinic: this refill cannot be delegated    Requested Prescriptions  Pending Prescriptions Disp Refills   temazepam (RESTORIL) 30 MG capsule [Pharmacy Med Name: TEMAZEPAM 30 MG CAPSULE] 30 capsule 0    Sig: TAKE 1 CAPSULE (30 MG TOTAL) BY MOUTH AT BEDTIME AS NEEDED FOR SLEEP.      Not Delegated - Psychiatry:  Anxiolytics/Hypnotics Failed - 12/09/2019  7:44 PM      Failed - This refill cannot be delegated      Failed - Urine Drug Screen completed in last 360 days.      Passed - Valid encounter within last 6 months    Recent Outpatient Visits           2 months ago Type 2 diabetes mellitus with microalbuminuria, without long-term current use of insulin [facility])   [facility] [name], MD   6 months ago Type 2 diabetes mellitus with other specified complication, without long-term current use of insulin [facility])   [facility] [name], MD   7 months ago Essential (primary) hypertension   [facility] [name], MD   12 months ago Annual physical exam   [facility] [name], MD   1 year ago Type 2 diabetes mellitus with microalbuminuria, without long-term current use of insulin [facility])   [facility] [name], MD       Future Appointments             In 2 months [name], Ander Slade, MD [facility], [facility]   In 3 months [name], MD [facility], [facility]

## 2019-12-10 NOTE — Telephone Encounter (Signed)
Please advise 

## 2020-01-03 LAB — HM DIABETES EYE EXAM

## 2020-01-14 ENCOUNTER — Other Ambulatory Visit: Payer: Self-pay | Admitting: Family Medicine

## 2020-01-14 NOTE — Telephone Encounter (Signed)
Requested medication (s) are due for refill today: Yes  Requested medication (s) are on the active medication list:Yes  Last refill:  12/11/19  Future visit scheduled: Yes  Notes to clinic:  See request.    Requested Prescriptions  Pending Prescriptions Disp Refills   temazepam (RESTORIL) 30 MG capsule [Pharmacy Med Name: TEMAZEPAM 30 MG CAPSULE] 30 capsule 0    Sig: TAKE 1 CAPSULE (30 MG TOTAL) BY MOUTH AT BEDTIME AS NEEDED FOR SLEEP.      Not Delegated - Psychiatry:  Anxiolytics/Hypnotics Failed - 01/14/2020 12:03 PM      Failed - This refill cannot be delegated      Failed - Urine Drug Screen completed in last 360 days.      Passed - Valid encounter within last 6 months    Recent Outpatient Visits           4 months ago Type 2 diabetes mellitus with microalbuminuria, without long-term current use of insulin [facility])   [facility] [name], MD   8 months ago Type 2 diabetes mellitus with other specified complication, without long-term current use of insulin [facility])   [facility] [name], MD   8 months ago Essential (primary) hypertension   [facility] [name], MD   1 year ago Annual physical exam   [facility] [name], MD   1 year ago Type 2 diabetes mellitus with microalbuminuria, without long-term current use of insulin [facility])   [facility] [name], MD       Future Appointments             In 1 month [name], Ander Slade, MD [facility] [place], [facility]   In 1 month [name], MD [facility], [facility]

## 2020-01-18 ENCOUNTER — Other Ambulatory Visit: Payer: Self-pay | Admitting: Cardiology

## 2020-02-18 NOTE — Progress Notes (Deleted)
[name] Date of Birth: 26-Apr-1960 Medical Record #[id number]  History of Present Illness: [name] is seen for followup CAD and VT . He has a significant history of coronary disease. He had his first myocardial infarction at age 59. At that time he had stenting of the proximal right coronary with a 4.0 x 13 mm MultiLink stent and the distal vessel with a 3.0 x 8 mm MultiLink stent. In June of 2010 he again had an acute inferior myocardial infarction related to occlusion of the distal right coronary. The distal vessel was stented with a 2.5 x 24 mm Taxus stent and a 2.75 x 20 mm Taxus stent at the crux. He had followup cardiac catheterization in September of 2010 which demonstrated nonobstructive disease and ejection fraction of 55%. His last evaluation with cardiac catheterization in July of 2013 showed nonobstructive disease as well.  He also has a history of symptomatic monomorphic ventricular tachycardia that has been treated with Betapace therapy- well controlled for the past 5 years.   On followup today he states he is doing well from a cardiac standpoint. He  denies palpitations, chest pain, SOB, or dizziness. He spends a lot of time  fishing. He does some walking. He has recently moved to a new house. Saw Dr Rosanna Randy in September and was supposed to have labs done but he never went.   Current Outpatient Medications on File Prior to Visit  Medication Sig Dispense Refill  . temazepam (RESTORIL) 30 MG capsule TAKE 1 CAPSULE (30 MG TOTAL) BY MOUTH AT BEDTIME AS NEEDED FOR SLEEP. 30 capsule 2  . amphetamine-dextroamphetamine (ADDERALL) 20 MG tablet Take by mouth. (Patient not taking: Reported on 09/12/2019)    . aspirin 81 MG tablet Take by mouth.    Marland Kitchen atorvastatin (LIPITOR) 80 MG tablet TAKE 1 TABLET BY MOUTH  DAILY AT 6PM 90 tablet 1  . lamoTRIgine (LAMICTAL) 25 MG tablet Take by mouth.    . levothyroxine (SYNTHROID) 150 MCG tablet TAKE 1 TABLET BY MOUTH  DAILY BEFORE BREAKFAST 90 tablet  2  . nitroGLYCERIN (NITROSTAT) 0.4 MG SL tablet Place 1 tablet (0.4 mg total) under the tongue every 5 (five) minutes x 3 doses as needed for chest pain. 25 tablet 4  . pioglitazone-metformin (ACTOPLUS MET) 15-850 MG tablet TAKE 1 TABLET BY MOUTH  TWICE DAILY 180 tablet 3  . ramipril (ALTACE) 2.5 MG capsule TAKE 1 CAPSULE BY MOUTH  DAILY 90 capsule 3  . sertraline (ZOLOFT) 100 MG tablet Take by mouth.    . sotalol (BETAPACE) 120 MG tablet TAKE 1 TABLET BY MOUTH TWO  TIMES DAILY 180 tablet 3   No current facility-administered medications on file prior to visit.    Allergies  Allergen Reactions  . Crestor [Rosuvastatin Calcium]     ITCHING AND PALPITATIONS  . Penicillins     As a child; took as adult and no reaction.    Past Medical History:  Diagnosis Date  . Arthritis   . Coronary artery disease   . Diabetes mellitus   . Dyslipidemia   . Gout   . History of tobacco use   . Hypertension   . Hypothyroidism   . Old inferior myocardial infarction   . Parathyroid adenoma   . Sleep apnea    does not use cpap  . Thyroid disease   . Ventricular tachycardia (HCC)    Symptomatic ventricular tachycardia    Past Surgical History:  Procedure Laterality Date  . CARDIAC CATHETERIZATION  11/2008   nonobstructive CAD, left main normal, LAD normal, minor LCx irregularities <10%, mRCA 30% ISR, dRCA 30% narrowing proximal to PDA and PLB bifurcation; stented segments widely patent, LVEF 55%; severe inferior basal wall hypokinesis  . CARDIAC CATHETERIZATION  08/2004   Acute inferior MI 2/2 dRCA occlusion s/p 2.5 x 24 mm Taxus stent and a 2.75 x 20 mm Taxus stent at the crux; LVEF 45%, moderate akinesis of the inferior wall  . CARDIAC CATHETERIZATION  2000   pRCA 4.0 x 13 mm MultiLink stent, dRCA 3.0 x 8 mm MultiLink stent  . CARDIAC CATHETERIZATION  10/06/11   normal left main, LAD, D1-3, LCx and OM1-OM3; 20% prox RCA stenosis, 40-50% ISR mid RCA, 20% ISR distal RCA; LVEF 45-50%; mild basal  inferior wall hypokinesis.  . CHOLECYSTECTOMY    . LEFT HEART CATHETERIZATION WITH CORONARY ANGIOGRAM N/A 10/06/2011   Procedure: LEFT HEART CATHETERIZATION WITH CORONARY ANGIOGRAM;  Surgeon: Wellington Hampshire, MD;  Location: [facility] CATH [facility];  Service: Cardiovascular;  Laterality: N/A;  . OTHER SURGICAL HISTORY     Status post removal of parathyroid adenoma  . parathroid      Social History   Tobacco Use  Smoking Status Former Smoker  . Quit date: 10/04/1997  . Years since quitting: 22.3  Smokeless Tobacco Current User  . Types: Snuff    Social History   Substance and Sexual Activity  Alcohol Use No    Family History  Problem Relation Age of Onset  . Heart attack Father   . Stroke Father   . Alzheimer's disease Father   . Diabetes Mother     Review of Systems: As noted in history of present illness.  All other systems were reviewed and are negative.  Physical Exam: There were no vitals taken for this visit. GENERAL:  Well appearing WM in NAD HEENT:  PERRL, EOMI, sclera are clear. Oropharynx is clear. NECK:  No jugular venous distention, carotid upstroke brisk and symmetric, no bruits, no thyromegaly or adenopathy LUNGS:  Clear to auscultation bilaterally CHEST:  Unremarkable HEART:  RRR,  PMI not displaced or sustained,S1 and S2 within normal limits, no S3, no S4: no clicks, no rubs, no murmurs ABD:  Soft, nontender. BS +, no masses or bruits. No hepatomegaly, no splenomegaly EXT:  2 + pulses throughout, no edema, no cyanosis no clubbing SKIN:  Warm and dry.  No rashes NEURO:  Alert and oriented x 3. Cranial nerves II through XII intact. PSYCH:  Cognitively intact      LABORATORY DATA: Ecg today shows NSR with rate 57. Old inferior infarct. LAD.  QTc is normal at 447 msec. I have personally reviewed and interpreted this study.  Lab Results  Component Value Date   WBC 8.4 02/08/2019   HGB 15.8 02/08/2019   HCT 44.2 02/08/2019   PLT 224 02/08/2019   GLUCOSE 206  (H) 02/13/2019   CHOL 137 02/08/2019   TRIG 148 02/08/2019   HDL 38 (L) 02/08/2019   LDLCALC 73 02/08/2019   ALT 13 02/08/2019   AST 13 02/08/2019   NA 142 02/13/2019   K 5.3 (H) 02/13/2019   CL 103 02/13/2019   CREATININE 1.07 02/13/2019   BUN 21 02/13/2019   CO2 24 02/13/2019   TSH 2.970 02/08/2019   INR 1.10 10/05/2011   HGBA1C 7.2 (A) 09/12/2019   MICROALBUR 50 05/15/2019     Assessment / Plan: 1. Coronary disease with multiple interventions as noted in history of present illness. Last  cath in 2013 showed nonobstructive disease. He is asymptomatic. Continue aspirin and statin therapy. Not on beta blocker since he is on Betapace.   2. Ventricular tachycardia. This is well controlled with sotalol therapy. QTC is normal. He is asymptomatic. Follow up renal function and electrolytes.   3. Dyslipidemia. Continue Lipitor high dose. Will make sure he gets lab work today.  4. Diabetes mellitus type 2. Will check A1c.   5. Hypothyroidism. On replacement therapy. Check TSH    I'll followup again in one year.Marland Kitchen

## 2020-02-22 ENCOUNTER — Ambulatory Visit: Payer: 59 | Admitting: Cardiology

## 2020-03-12 NOTE — Progress Notes (Signed)
Complete physical exam   Patient: [name]   DOB: Nov 05, 1960   59 y.o. Male  MRN: [id number] Visit Date: 03/13/2020  Today's healthcare provider: [name], MD   Chief Complaint  Patient presents with  . Annual Exam  . Diabetes  . Hypertension   Subjective    [name] is a 59 y.o. male who presents today for a complete physical exam.  He reports consuming a general diet. The patient does not participate in regular exercise at present. He generally feels well. He reports sleeping well. He does not have additional problems to discuss today.  Patient's wife complains of him that he sleeps a lot has decreased energy and decreased libido. Denies depression and is getting no exercise.   Diabetes Mellitus Type II, Follow-up  Lab Results  Component Value Date   HGBA1C 7.2 (A) 09/12/2019   HGBA1C 7.4 (A) 05/15/2019   HGBA1C 7.6 (H) 02/08/2019   Wt Readings from Last 3 Encounters:  03/13/20 206 lb (93.4 kg)  09/12/19 199 lb 6.4 oz (90.4 kg)  05/15/19 195 lb 12.8 oz (88.8 kg)   Last seen for diabetes 6 months ago.  Management since then includes no changes. He reports good compliance with treatment. He is not having side effects.  Symptoms: No fatigue No foot ulcerations  No appetite changes No nausea  No paresthesia of the feet  No polydipsia  No polyuria No visual disturbances   No vomiting     Home blood sugar records: trend: stable  Episodes of hypoglycemia? No    Current insulin regiment: none Most Recent Eye Exam: due Current exercise: none Current diet habits: well balanced  Pertinent Labs: Lab Results  Component Value Date   CHOL 137 02/08/2019   HDL 38 (L) 02/08/2019   LDLCALC 73 02/08/2019   TRIG 148 02/08/2019   CHOLHDL 3.6 02/08/2019   Lab Results  Component Value Date   NA 142 02/13/2019   K 5.3 (H) 02/13/2019   CREATININE 1.07 02/13/2019   GFRNONAA 75 02/13/2019   GFRAA 87 02/13/2019   GLUCOSE 206 (H) 02/13/2019      Hypertension, follow-up  BP Readings from Last 3 Encounters:  03/13/20 (!) 142/88  09/12/19 (!) 142/89  05/15/19 124/83   Wt Readings from Last 3 Encounters:  03/13/20 206 lb (93.4 kg)  09/12/19 199 lb 6.4 oz (90.4 kg)  05/15/19 195 lb 12.8 oz (88.8 kg)     He was last seen for hypertension 6 months ago.  BP at that visit was 142/89. Management since that visit includes no changes.  He reports excellent compliance with treatment. He is not having side effects.  He is following a Regular diet. He is not exercising. He does not smoke.  Use of agents associated with hypertension: none.   Outside blood pressures are not being checked. Symptoms: No chest pain No chest pressure  No palpitations No syncope  No dyspnea No orthopnea  No paroxysmal nocturnal dyspnea No lower extremity edema    Past Medical History:  Diagnosis Date  . Arthritis   . Coronary artery disease   . Diabetes mellitus   . Dyslipidemia   . Gout   . History of tobacco use   . Hypertension   . Hypothyroidism   . Old inferior myocardial infarction   . Parathyroid adenoma   . Sleep apnea    does not use cpap  . Thyroid disease   . Ventricular tachycardia ([place])  Symptomatic ventricular tachycardia   Past Surgical History:  Procedure Laterality Date  . CARDIAC CATHETERIZATION  11/2008   nonobstructive CAD, left main normal, LAD normal, minor LCx irregularities <10%, mRCA 30% ISR, dRCA 30% narrowing proximal to PDA and PLB bifurcation; stented segments widely patent, LVEF 55%; severe inferior basal wall hypokinesis  . CARDIAC CATHETERIZATION  08/2004   Acute inferior MI 2/2 dRCA occlusion s/p 2.5 x 24 mm Taxus stent and a 2.75 x 20 mm Taxus stent at the crux; LVEF 45%, moderate akinesis of the inferior wall  . CARDIAC CATHETERIZATION  2000   pRCA 4.0 x 13 mm MultiLink stent, dRCA 3.0 x 8 mm MultiLink stent  . CARDIAC CATHETERIZATION  10/06/11   normal left main, LAD, D1-3, LCx and OM1-OM3; 20%  prox RCA stenosis, 40-50% ISR mid RCA, 20% ISR distal RCA; LVEF 45-50%; mild basal inferior wall hypokinesis.  . CHOLECYSTECTOMY    . LEFT HEART CATHETERIZATION WITH CORONARY ANGIOGRAM N/A 10/06/2011   Procedure: LEFT HEART CATHETERIZATION WITH CORONARY ANGIOGRAM;  Surgeon: Wellington Hampshire, MD;  Location: [facility];  Service: Cardiovascular;  Laterality: N/A;  . OTHER SURGICAL HISTORY     Status post removal of parathyroid adenoma  . parathroid     Social History   Socioeconomic History  . Marital status: Married    Spouse name: Not on file  . Number of children: 3  . Years of education: Not on file  . Highest education level: Not on file  Occupational History  . 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 Smoking status: Former Smoker    Quit date: 10/04/1997    Years since quitting: 22.4  . Smokeless tobacco: Current User    Types: Snuff  Vaping Use  . Vaping Use: Never used  Substance and Sexual Activity  . Alcohol use: No  . Drug use: No  . Sexual activity: Yes  Other Topics Concern  . Not on file  Social History Narrative   Lives in [place], [place] with wife.    Social Determinants of Health   Financial Resource Strain: Not on file  Food Insecurity: Not on file  Transportation Needs: Not on file  Physical Activity: Not on file  Stress: Not on file  Social Connections: Not on file  Intimate Partner Violence: Not on file   Family Status  Relation Name Status  . Father  Deceased at age 72  . Mother  Alive   Family History  Problem Relation Age of Onset  . Heart attack Father   . Stroke Father   . Alzheimer's disease Father   . Diabetes Mother    Allergies  Allergen Reactions  . Crestor [Rosuvastatin Calcium]     ITCHING AND PALPITATIONS  . Penicillins     As a child; took as adult and no reaction.    Patient Care Team: [name], MD as PCP - General (Unknown Physician Specialty)   Medications: Outpatient Medications Prior to Visit   Medication Sig  . amphetamine-dextroamphetamine (ADDERALL) 20 MG tablet Take by mouth.  Marland Kitchen aspirin 81 MG tablet Take by mouth.  Marland Kitchen atorvastatin (LIPITOR) 80 MG tablet TAKE 1 TABLET BY MOUTH  DAILY AT 6PM  . lamoTRIgine (LAMICTAL) 25 MG tablet Take by mouth.  . levothyroxine (SYNTHROID) 150 MCG tablet TAKE 1 TABLET BY MOUTH  DAILY BEFORE BREAKFAST  . nitroGLYCERIN (NITROSTAT) 0.4 MG SL tablet Place 1 tablet (0.4 mg total) under the tongue every 5 (five) minutes x 3 doses  as needed for chest pain.  . pioglitazone-metformin (ACTOPLUS MET) 15-850 MG tablet TAKE 1 TABLET BY MOUTH  TWICE DAILY  . ramipril (ALTACE) 2.5 MG capsule TAKE 1 CAPSULE BY MOUTH  DAILY  . sertraline (ZOLOFT) 100 MG tablet Take by mouth.  . sotalol (BETAPACE) 120 MG tablet TAKE 1 TABLET BY MOUTH TWO  TIMES DAILY  . temazepam (RESTORIL) 30 MG capsule TAKE 1 CAPSULE (30 MG TOTAL) BY MOUTH AT BEDTIME AS NEEDED FOR SLEEP.   No facility-administered medications prior to visit.    Review of Systems  All other systems reviewed and are negative.   Last hemoglobin A1c Lab Results  Component Value Date   HGBA1C 7.8 (H) 03/13/2020      Objective    BP (!) 142/88   Temp (!) 87 F (30.6 C)   Resp 16   Ht _0  (1.778 m)   Wt 206 lb (93.4 kg)   BMI 29.56 kg/m  BP Readings from Last 3 Encounters:  03/13/20 (!) 142/88  09/12/19 (!) 142/89  05/15/19 124/83   Wt Readings from Last 3 Encounters:  03/13/20 206 lb (93.4 kg)  09/12/19 199 lb 6.4 oz (90.4 kg)  05/15/19 195 lb 12.8 oz (88.8 kg)      Physical Exam Vitals reviewed.  Constitutional:      Appearance: He is well-developed.  HENT:     Head: Normocephalic and atraumatic.     Right Ear: External ear normal.     Left Ear: External ear normal.     Nose: Nose normal.  Eyes:     General: No scleral icterus.    Conjunctiva/sclera: Conjunctivae normal.  Neck:     Thyroid: No thyromegaly.  Cardiovascular:     Rate and Rhythm: Normal rate and regular rhythm.      Heart sounds: Normal heart sounds.  Pulmonary:     Effort: Pulmonary effort is normal.     Breath sounds: Normal breath sounds.  Abdominal:     Palpations: Abdomen is soft.  Genitourinary:    Penis: Normal.      Testes: Normal.     Prostate: Normal.     Rectum: Normal.  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  Skin:    General: Skin is warm and dry.  Neurological:     General: No focal deficit present.     Mental Status: He is alert and oriented to person, place, and time.     Cranial Nerves: No cranial nerve deficit.     Motor: No abnormal muscle tone.     Coordination: Coordination normal.     Comments: Diabetic foot exam normal.  Psychiatric:        Mood and Affect: Mood normal.        Behavior: Behavior normal.        Thought Content: Thought content normal.        Judgment: Judgment normal.       Last depression screening scores PHQ 2/9 Scores 03/13/2020 12/13/2018 12/15/2016  PHQ - 2 Score 3 2 0  PHQ- 9 Score 10 4 -   Last fall risk screening Fall Risk  03/13/2020  Falls in the past year? 0  Number falls in past yr: 0  Injury with Fall? 0  Risk for fall due to : No Fall Risks  Follow up Falls evaluation completed   Last Audit-C alcohol  use screening Alcohol Use Disorder Test (AUDIT) 03/13/2020  1. How often do you have a drink containing alcohol? 0  2. How many drinks containing alcohol do you have on a typical day when you are drinking? 0  3. How often do you have six or more drinks on one occasion? 0  AUDIT-C Score 0  Alcohol Brief Interventions/Follow-up AUDIT Score <7 follow-up not indicated   A score of 3 or more in women, and 4 or more in men indicates increased risk for alcohol abuse, EXCEPT if all of the points are from question 1   No results found for any visits on 03/13/20.  Assessment & Plan    Routine  Health Maintenance and Physical Exam  Exercise Activities and Dietary recommendations Goals   None     Immunization History  Administered Date(s) Administered  . Influenza,inj,Quad PF,6+ Mos 02/03/2016, 02/08/2019  . PFIZER SARS-COV-2 Vaccination 07/16/2019, 08/07/2019  . Tdap 12/17/2010    Health Maintenance  Topic Date Due  . PNEUMOCOCCAL POLYSACCHARIDE VACCINE AGE 77-64 HIGH RISK  Never done  . FOOT EXAM  Never done  . HIV Screening  Never done  . COVID-19 Vaccine (3 - Booster for Pfizer series) 02/07/2020  . HEMOGLOBIN A1C  03/13/2020  . INFLUENZA VACCINE  06/19/2020 (Originally 10/21/2019)  . TETANUS/TDAP  12/16/2020  . OPHTHALMOLOGY EXAM  01/02/2021  . Fecal DNA (Cologuard)  01/21/2022  . Hepatitis C Screening  Completed    Discussed health benefits of physical activity, and encouraged him to engage in regular exercise appropriate for his age and condition.  1. Annual physical exam Patient is willing to get colonoscopy.  2. Type 2 diabetes mellitus with other specified complication, without long-term current use of insulin (HCC)  - Hemoglobin A1c  3. Essential hypertension Fair control.  Check blood pressure at home and bring in readings on next visit. - Comprehensive Metabolic Panel (CMET)  4. Coronary artery disease involving native coronary artery of native heart without angina pectoris All risk factors treated. - CBC w/Diff/Platelet  5. Hypothyroidism, unspecified type  - TSH  6. Other hyperlipidemia  - Lipid panel  7. Prostate cancer screening  - PSA  8. Screening for blood or protein in urine  - POCT urinalysis dipstick  9. Fatigue, unspecified type I do not think this is low testosterone.  Patient does not appear to be depressed.  Check levels due to concern of his wife. - Testosterone   No follow-ups on file.     I, [name], MD, have reviewed all documentation for this visit. The documentation on 03/17/20 for the exam,  diagnosis, procedures, and orders are all accurate and complete.    Jaxin Fulfer Cranford Mon, MD  [facility] [phone] (phone) [phone] (fax)  [place]

## 2020-03-13 ENCOUNTER — Encounter: Payer: Self-pay | Admitting: Family Medicine

## 2020-03-13 ENCOUNTER — Other Ambulatory Visit: Payer: Self-pay

## 2020-03-13 ENCOUNTER — Ambulatory Visit (INDEPENDENT_AMBULATORY_CARE_PROVIDER_SITE_OTHER): Payer: 59 | Admitting: Family Medicine

## 2020-03-13 VITALS — BP 142/88 | Temp 87.0°F | Resp 16 | Ht 70.0 in | Wt 206.0 lb

## 2020-03-13 DIAGNOSIS — I10 Essential (primary) hypertension: Secondary | ICD-10-CM

## 2020-03-13 DIAGNOSIS — Z125 Encounter for screening for malignant neoplasm of prostate: Secondary | ICD-10-CM

## 2020-03-13 DIAGNOSIS — I2510 Atherosclerotic heart disease of native coronary artery without angina pectoris: Secondary | ICD-10-CM | POA: Diagnosis not present

## 2020-03-13 DIAGNOSIS — Z1389 Encounter for screening for other disorder: Secondary | ICD-10-CM | POA: Diagnosis not present

## 2020-03-13 DIAGNOSIS — R5383 Other fatigue: Secondary | ICD-10-CM

## 2020-03-13 DIAGNOSIS — E039 Hypothyroidism, unspecified: Secondary | ICD-10-CM | POA: Diagnosis not present

## 2020-03-13 DIAGNOSIS — Z0000 Encounter for general adult medical examination without abnormal findings: Secondary | ICD-10-CM | POA: Diagnosis not present

## 2020-03-13 DIAGNOSIS — E1169 Type 2 diabetes mellitus with other specified complication: Secondary | ICD-10-CM | POA: Diagnosis not present

## 2020-03-13 DIAGNOSIS — E7849 Other hyperlipidemia: Secondary | ICD-10-CM

## 2020-03-14 LAB — HEMOGLOBIN A1C
Est. average glucose Bld gHb Est-mCnc: 177 mg/dL
Hgb A1c MFr Bld: 7.8 % — ABNORMAL HIGH (ref 4.8–5.6)

## 2020-03-14 LAB — CBC WITH DIFFERENTIAL/PLATELET
Basophils Absolute: 0.1 10*3/uL (ref 0.0–0.2)
Basos: 1 %
EOS (ABSOLUTE): 0.5 10*3/uL — ABNORMAL HIGH (ref 0.0–0.4)
Eos: 6 %
Hematocrit: 43.3 % (ref 37.5–51.0)
Hemoglobin: 14.9 g/dL (ref 13.0–17.7)
Immature Grans (Abs): 0 10*3/uL (ref 0.0–0.1)
Immature Granulocytes: 0 %
Lymphocytes Absolute: 1.8 10*3/uL (ref 0.7–3.1)
Lymphs: 22 %
MCH: 33.1 pg — ABNORMAL HIGH (ref 26.6–33.0)
MCHC: 34.4 g/dL (ref 31.5–35.7)
MCV: 96 fL (ref 79–97)
Monocytes Absolute: 0.7 10*3/uL (ref 0.1–0.9)
Monocytes: 9 %
Neutrophils Absolute: 5.1 10*3/uL (ref 1.4–7.0)
Neutrophils: 62 %
Platelets: 192 10*3/uL (ref 150–450)
RBC: 4.5 x10E6/uL (ref 4.14–5.80)
RDW: 11.9 % (ref 11.6–15.4)
WBC: 8.2 10*3/uL (ref 3.4–10.8)

## 2020-03-14 LAB — LIPID PANEL
Chol/HDL Ratio: 3.5 ratio (ref 0.0–5.0)
Cholesterol, Total: 115 mg/dL (ref 100–199)
HDL: 33 mg/dL — ABNORMAL LOW (ref >39)
LDL Chol Calc (NIH): 62 mg/dL (ref 0–99)
Triglycerides: 106 mg/dL (ref 0–149)
VLDL Cholesterol Cal: 20 mg/dL (ref 5–40)

## 2020-03-14 LAB — COMPREHENSIVE METABOLIC PANEL
ALT: 14 IU/L (ref 0–44)
AST: 15 IU/L (ref 0–40)
Albumin/Globulin Ratio: 1.8 (ref 1.2–2.2)
Albumin: 4.5 g/dL (ref 3.8–4.8)
Alkaline Phosphatase: 117 IU/L (ref 44–121)
BUN/Creatinine Ratio: 16 (ref 10–24)
BUN: 16 mg/dL (ref 8–27)
Bilirubin Total: 0.4 mg/dL (ref 0.0–1.2)
CO2: 26 mmol/L (ref 20–29)
Calcium: 9.6 mg/dL (ref 8.6–10.2)
Chloride: 101 mmol/L (ref 96–106)
Creatinine, Ser: 1.03 mg/dL (ref 0.76–1.27)
GFR calc Af Amer: 90 mL/min/{1.73_m2} (ref >59)
GFR calc non Af Amer: 78 mL/min/{1.73_m2} (ref >59)
Globulin, Total: 2.5 g/dL (ref 1.5–4.5)
Glucose: 191 mg/dL — ABNORMAL HIGH (ref 65–99)
Potassium: 5.2 mmol/L (ref 3.5–5.2)
Sodium: 142 mmol/L (ref 134–144)
Total Protein: 7 g/dL (ref 6.0–8.5)

## 2020-03-14 LAB — PSA: Prostate Specific Ag, Serum: 0.9 ng/mL (ref 0.0–4.0)

## 2020-03-14 LAB — TESTOSTERONE: Testosterone: 563 ng/dL (ref 264–916)

## 2020-03-14 LAB — TSH: TSH: 3.16 u[IU]/mL (ref 0.450–4.500)

## 2020-04-02 LAB — POCT URINALYSIS DIPSTICK
Bilirubin, UA: NEGATIVE
Blood, UA: NEGATIVE
Glucose, UA: NEGATIVE
Ketones, UA: NEGATIVE
Leukocytes, UA: NEGATIVE
Nitrite, UA: NEGATIVE
Protein, UA: NEGATIVE
Spec Grav, UA: 1.01 (ref 1.010–1.025)
Urobilinogen, UA: 0.2 E.U./dL
pH, UA: 6 (ref 5.0–8.0)

## 2020-04-09 ENCOUNTER — Other Ambulatory Visit: Payer: Self-pay | Admitting: Family Medicine

## 2020-04-10 ENCOUNTER — Encounter: Payer: Self-pay | Admitting: Family Medicine

## 2020-04-10 NOTE — Telephone Encounter (Signed)
Requested medications are due for refill today yes  Requested medications are on the active medication list yes  Last refill 03/12/20  Last visit 02/2020  Future visit scheduled 06/2020  Notes to clinic Not Delegated.

## 2020-04-11 ENCOUNTER — Other Ambulatory Visit: Payer: Self-pay | Admitting: Family Medicine

## 2020-04-11 NOTE — Telephone Encounter (Signed)
Copied from [place] [id number]. Topic: Quick Communication - Rx Refill/Question >> Apr 11, 2020  9:49 AM Tessa Lerner A wrote: Medication: sertraline (ZOLOFT) 100 MG tablet   Has the patient contacted their pharmacy? No. Patient declined to contact pharmacy. Is having difficulty with previous provider and pharmacy.   Preferred Pharmacy (with phone number or street name): [id number] - [place], [place] Phone: [phone]  Agent: Please be advised that RX refills may take up to 3 business days. We ask that you follow-up with your pharmacy.

## 2020-04-11 NOTE — Telephone Encounter (Signed)
   Notes to clinic:  medication was last filled by a historical provider Review script for refill   Requested Prescriptions  Pending Prescriptions Disp Refills   sertraline (ZOLOFT) 100 MG tablet      Sig: Take by mouth.      Psychiatry:  Antidepressants - SSRI Passed - 04/11/2020  9:58 AM      Passed - Completed PHQ-2 or PHQ-9 in the last 360 days      Passed - Valid encounter within last 6 months    Recent Outpatient Visits           4 weeks ago Annual physical exam   [facility] [name], MD   7 months ago Type 2 diabetes mellitus with microalbuminuria, without long-term current use of insulin [facility])   [facility] [name], MD   11 months ago Type 2 diabetes mellitus with other specified complication, without long-term current use of insulin [facility])   [facility] [name], MD   11 months ago Essential (primary) hypertension   [facility] [name], MD   1 year ago Annual physical exam   [facility] [name], MD       Future Appointments             In 5 days [name], Ander Slade, MD [facility] [place], [facility]   In 3 months [name], MD [facility]

## 2020-04-13 ENCOUNTER — Other Ambulatory Visit: Payer: Self-pay | Admitting: Family Medicine

## 2020-04-13 NOTE — Progress Notes (Signed)
[name] Date of Birth: Feb 02, 1961 Medical Record #[id number]  History of Present Illness: [name] is seen for followup CAD and VT . He has a significant history of coronary disease. He had his first myocardial infarction at age 60. At that time he had stenting of the proximal right coronary with a 4.0 x 13 mm MultiLink stent and the distal vessel with a 3.0 x 8 mm MultiLink stent. In June of 2010 he again had an acute inferior myocardial infarction related to occlusion of the distal right coronary. The distal vessel was stented with a 2.5 x 24 mm Taxus stent and a 2.75 x 20 mm Taxus stent at the crux. He had followup cardiac catheterization in September of 2010 which demonstrated nonobstructive disease and ejection fraction of 55%. His last evaluation with cardiac catheterization in July of 2013 showed nonobstructive disease as well.  He also has a history of symptomatic monomorphic ventricular tachycardia that has been treated with Betapace therapy- well controlled.  On followup today he states he is doing well from a cardiac standpoint. He is enjoying retirement. He denies palpitations, chest pain, SOB, or dizziness. Tolerating medication well.    Current Outpatient Medications on File Prior to Visit  Medication Sig Dispense Refill  . aspirin 81 MG tablet Take by mouth.    Marland Kitchen atorvastatin (LIPITOR) 80 MG tablet TAKE 1 TABLET BY MOUTH  DAILY AT 6PM 90 tablet 1  . lamoTRIgine (LAMICTAL) 25 MG tablet Take by mouth.    . levothyroxine (SYNTHROID) 150 MCG tablet TAKE 1 TABLET BY MOUTH  DAILY BEFORE BREAKFAST 90 tablet 2  . nitroGLYCERIN (NITROSTAT) 0.4 MG SL tablet Place 1 tablet (0.4 mg total) under the tongue every 5 (five) minutes x 3 doses as needed for chest pain. 25 tablet 4  . pioglitazone-metformin (ACTOPLUS MET) 15-850 MG tablet TAKE 1 TABLET BY MOUTH  TWICE DAILY 180 tablet 3  . ramipril (ALTACE) 2.5 MG capsule TAKE 1 CAPSULE BY MOUTH  DAILY 90 capsule 3  . sertraline (ZOLOFT) 100  MG tablet Take by mouth.    . sotalol (BETAPACE) 120 MG tablet TAKE 1 TABLET BY MOUTH TWO  TIMES DAILY 180 tablet 3  . temazepam (RESTORIL) 30 MG capsule TAKE 1 CAPSULE BY MOUTH AT BEDTIME AS NEEDED FOR SLEEP. 30 capsule 2   No current facility-administered medications on file prior to visit.    Allergies  Allergen Reactions  . Crestor [Rosuvastatin Calcium]     ITCHING AND PALPITATIONS  . Penicillins     As a child; took as adult and no reaction.    Past Medical History:  Diagnosis Date  . Arthritis   . Coronary artery disease   . Diabetes mellitus   . Dyslipidemia   . Gout   . History of tobacco use   . Hypertension   . Hypothyroidism   . Old inferior myocardial infarction   . Parathyroid adenoma   . Sleep apnea    does not use cpap  . Thyroid disease   . Ventricular tachycardia (HCC)    Symptomatic ventricular tachycardia    Past Surgical History:  Procedure Laterality Date  . CARDIAC CATHETERIZATION  11/2008   nonobstructive CAD, left main normal, LAD normal, minor LCx irregularities <10%, mRCA 30% ISR, dRCA 30% narrowing proximal to PDA and PLB bifurcation; stented segments widely patent, LVEF 55%; severe inferior basal wall hypokinesis  . CARDIAC CATHETERIZATION  08/2004   Acute inferior MI 2/2 dRCA occlusion s/p 2.5 x 24 mm  Taxus stent and a 2.75 x 20 mm Taxus stent at the crux; LVEF 45%, moderate akinesis of the inferior wall  . CARDIAC CATHETERIZATION  2000   pRCA 4.0 x 13 mm MultiLink stent, dRCA 3.0 x 8 mm MultiLink stent  . CARDIAC CATHETERIZATION  10/06/11   normal left main, LAD, D1-3, LCx and OM1-OM3; 20% prox RCA stenosis, 40-50% ISR mid RCA, 20% ISR distal RCA; LVEF 45-50%; mild basal inferior wall hypokinesis.  . CHOLECYSTECTOMY    . LEFT HEART CATHETERIZATION WITH CORONARY ANGIOGRAM N/A 10/06/2011   Procedure: LEFT HEART CATHETERIZATION WITH CORONARY ANGIOGRAM;  Surgeon: Wellington Hampshire, MD;  Location: [facility];  Service: Cardiovascular;   Laterality: N/A;  . OTHER SURGICAL HISTORY     Status post removal of parathyroid adenoma  . parathroid      Social History   Tobacco Use  Smoking Status Former Smoker  . Quit date: 10/04/1997  . Years since quitting: 22.5  Smokeless Tobacco Current User  . Types: Snuff    Social History   Substance and Sexual Activity  Alcohol Use No    Family History  Problem Relation Age of Onset  . Heart attack Father   . Stroke Father   . Alzheimer's disease Father   . Diabetes Mother     Review of Systems: As noted in history of present illness.  All other systems were reviewed and are negative.  Physical Exam: BP 129/79   Pulse 65   Ht 5' 10"  (1.778 m)   Wt 208 lb 3.2 oz (94.4 kg)   SpO2 99%   BMI 29.87 kg/m  GENERAL:  Well appearing WM in NAD HEENT:  PERRL, EOMI, sclera are clear. Oropharynx is clear. NECK:  No jugular venous distention, carotid upstroke brisk and symmetric, no bruits, no thyromegaly or adenopathy LUNGS:  Clear to auscultation bilaterally CHEST:  Unremarkable HEART:  RRR,  PMI not displaced or sustained,S1 and S2 within normal limits, no S3, no S4: no clicks, no rubs, no murmurs ABD:  Soft, nontender. BS +, no masses or bruits. No hepatomegaly, no splenomegaly EXT:  2 + pulses throughout, no edema, no cyanosis no clubbing SKIN:  Warm and dry.  No rashes NEURO:  Alert and oriented x 3. Cranial nerves II through XII intact. PSYCH:  Cognitively intact   LABORATORY DATA: Ecg today shows NSR with rate 65. Old inferior infarct. LAD.  QTc is normal at 453 msec. I have personally reviewed and interpreted this study.  Lab Results  Component Value Date   WBC 8.2 03/13/2020   HGB 14.9 03/13/2020   HCT 43.3 03/13/2020   PLT 192 03/13/2020   GLUCOSE 191 (H) 03/13/2020   CHOL 115 03/13/2020   TRIG 106 03/13/2020   HDL 33 (L) 03/13/2020   LDLCALC 62 03/13/2020   ALT 14 03/13/2020   AST 15 03/13/2020   NA 142 03/13/2020   K 5.2 03/13/2020   CL 101  03/13/2020   CREATININE 1.03 03/13/2020   BUN 16 03/13/2020   CO2 26 03/13/2020   TSH 3.160 03/13/2020   INR 1.10 10/05/2011   HGBA1C 7.8 (H) 03/13/2020   MICROALBUR 50 05/15/2019     Assessment / Plan: 1. Coronary disease with multiple interventions as noted in history of present illness. Last cath in 2013 showed nonobstructive disease. He is asymptomatic. Continue aspirin and statin therapy. Not on beta blocker since he is on Betapace.   2. Ventricular tachycardia. This is well controlled with sotalol therapy. QTC  is normal. He is asymptomatic. Labs are OK.  3. Dyslipidemia. Continue Lipitor high dose. LDL at goal < 70.   4. Diabetes mellitus type 2. A1c 7.8 on Actos/metformin. Would consider addition of  SGLT 2 inhibitor or GLP-1 inhibitor given CV benefits. Will defer to Dr Rosanna Randy.   5. Hypothyroidism. On replacement therapy.    I'll followup again in one year.Marland Kitchen

## 2020-04-14 ENCOUNTER — Other Ambulatory Visit: Payer: Self-pay | Admitting: *Deleted

## 2020-04-14 NOTE — Telephone Encounter (Signed)
Rx request sent to provider

## 2020-04-16 ENCOUNTER — Ambulatory Visit (INDEPENDENT_AMBULATORY_CARE_PROVIDER_SITE_OTHER): Payer: 59 | Admitting: Cardiology

## 2020-04-16 ENCOUNTER — Encounter: Payer: Self-pay | Admitting: Cardiology

## 2020-04-16 ENCOUNTER — Other Ambulatory Visit: Payer: Self-pay

## 2020-04-16 VITALS — BP 129/79 | HR 65 | Ht 70.0 in | Wt 208.2 lb

## 2020-04-16 DIAGNOSIS — I2510 Atherosclerotic heart disease of native coronary artery without angina pectoris: Secondary | ICD-10-CM | POA: Diagnosis not present

## 2020-04-16 DIAGNOSIS — I4729 Other ventricular tachycardia: Secondary | ICD-10-CM

## 2020-04-16 DIAGNOSIS — E7800 Pure hypercholesterolemia, unspecified: Secondary | ICD-10-CM

## 2020-04-16 DIAGNOSIS — I472 Ventricular tachycardia: Secondary | ICD-10-CM | POA: Diagnosis not present

## 2020-04-16 DIAGNOSIS — E1129 Type 2 diabetes mellitus with other diabetic kidney complication: Secondary | ICD-10-CM | POA: Diagnosis not present

## 2020-04-16 DIAGNOSIS — R809 Proteinuria, unspecified: Secondary | ICD-10-CM

## 2020-04-16 NOTE — Addendum Note (Signed)
Addended by: Kathyrn Lass on: 04/16/2020 09:42 AM   Modules accepted: Orders

## 2020-04-18 MED ORDER — LAMOTRIGINE 25 MG PO TABS: 25.0000 mg | ORAL_TABLET | Freq: Every day | ORAL | 3 refills | Status: DC

## 2020-04-18 MED ORDER — SERTRALINE HCL 100 MG PO TABS: 100.0000 mg | ORAL_TABLET | Freq: Every day | ORAL | 3 refills | Status: DC

## 2020-05-10 ENCOUNTER — Other Ambulatory Visit: Payer: Self-pay | Admitting: Cardiology

## 2020-05-28 ENCOUNTER — Other Ambulatory Visit: Payer: Self-pay | Admitting: Family Medicine

## 2020-05-28 NOTE — Telephone Encounter (Signed)
Requested Prescriptions  Pending Prescriptions Disp Refills  . levothyroxine (SYNTHROID) 150 MCG tablet [Pharmacy Med Name: MYLAN-LEVOTHYROX 150MCG TABLET] 90 tablet 3    Sig: TAKE 1 TABLET BY MOUTH  DAILY BEFORE BREAKFAST     Endocrinology:  Hypothyroid Agents Failed - 05/28/2020 11:12 PM      Failed - TSH needs to be rechecked within 3 months after an abnormal result. Refill until TSH is due.      Passed - TSH in normal range and within 360 days    TSH  Date Value Ref Range Status  03/13/2020 3.160 0.450 - 4.500 uIU/mL Final         Passed - Valid encounter within last 12 months    Recent Outpatient Visits          2 months ago Annual physical exam   [facility] [name], MD   8 months ago Type 2 diabetes mellitus with microalbuminuria, without long-term current use of insulin [facility])   [facility] [name], MD   1 year ago Type 2 diabetes mellitus with other specified complication, without long-term current use of insulin [facility])   [facility] [name], MD   1 year ago Essential (primary) hypertension   [facility] [name], MD   1 year ago Annual physical exam   [facility] [name], MD      Future Appointments            In 1 month [name], MD [facility] [place]

## 2020-06-17 NOTE — Progress Notes (Signed)
This encounter was created in error - please disregard.

## 2020-06-17 NOTE — Progress Notes (Unsigned)
This encounter was created in error - please disregard.

## 2020-06-28 ENCOUNTER — Other Ambulatory Visit: Payer: Self-pay | Admitting: Family Medicine

## 2020-06-29 NOTE — Telephone Encounter (Signed)
Requested Prescriptions  Pending Prescriptions Disp Refills  . pioglitazone-metformin (ACTOPLUS MET) 15-850 MG tablet [Pharmacy Med Name: PIOGLIT  15MG 850MG  TAB  MET] 180 tablet 3    Sig: TAKE 1 TABLET BY MOUTH  TWICE DAILY     Endocrinology:  Diabetes - Biguanide + Pioglitazone Combo Passed - 06/28/2020 10:21 PM      Passed - Cr in normal range and within 360 days    Creat  Date Value Ref Range Status  01/05/2017 0.93 0.70 - 1.33 mg/dL Final    Comment:    For patients >20 years of age, the reference limit for Creatinine is approximately 13% higher for people identified as African-American. .    Creatinine, Ser  Date Value Ref Range Status  03/13/2020 1.03 0.76 - 1.27 mg/dL Final         Passed - HBA1C is between 0 and 7.9 and within 180 days    Hgb A1c MFr Bld  Date Value Ref Range Status  03/13/2020 7.8 (H) 4.8 - 5.6 % Final    Comment:             Prediabetes: 5.7 - 6.4          Diabetes: >6.4          Glycemic control for adults with diabetes: <7.0          Passed - eGFR in normal range and within 360 days    GFR, Est African American  Date Value Ref Range Status  01/05/2017 105 > OR = 60 mL/min/1.5m Final   GFR calc Af Amer  Date Value Ref Range Status  03/13/2020 90 >59 mL/min/1.73 Final    Comment:    **In accordance with recommendations from the NKF-ASN Task force,**   [facility] is in the process of updating its eGFR calculation to the   2021 CKD-EPI creatinine equation that estimates kidney function   without a race variable.    GFR, Est Non African American  Date Value Ref Range Status  01/05/2017 90 > OR = 60 mL/min/1.735mFinal   GFR calc non Af Amer  Date Value Ref Range Status  03/13/2020 78 >59 mL/min/1.73 Final         Passed - Valid encounter within last 6 months    Recent Outpatient Visits          3 months ago [facility]   9 months ago Type 2 diabetes mellitus with  microalbuminuria, [facility]   1 year ago Type 2 diabetes mellitus with other specified complication, [facility] long-term current use of [facility]  [facility]   1 year ago Annual physical exam   [facility]   2 years ago Type 2 diabetes mellitus with microalbuminuria, [facility] long-term current use of [facility]  [facility]      Future Appointments            In 2 weeks GiJerrol Banana MD [facility]

## 2020-07-15 NOTE — Progress Notes (Signed)
Established patient visit   Patient: [name]   DOB: 1960-06-15   60 y.o. Male  MRN: [id number] Visit Date: 07/16/2020  Today's healthcare provider: [name], MD   Chief Complaint  Patient presents with  . Diabetes  . Hyperlipidemia  . Hypertension   Subjective    HPI  Patient comes in today for follow-up.  He is married and is a father of 3 and has 6 grandchildren ages 73 years down to 3 months. Everything in his health is stable according to patient.  He feels about the same.  He stopped his sertraline at the end of 2021 and has not seen Dr. Nicolasa Ducking anymore.  His wife thinks he depressed but patient states he is "always been lazy".  He just does not want to do anything and wants to sleep all the time.  He states this is normal for him.  He denies depressed mood or anhedonia. Diabetes Mellitus Type II, follow-up  Lab Results  Component Value Date   HGBA1C 8.3 (A) 07/16/2020   HGBA1C 7.8 (H) 03/13/2020   HGBA1C 7.2 (A) 09/12/2019   Last seen for diabetes 4 months ago.  Management since then includes continuing the same treatment. He reports good compliance with treatment. He is not having side effects.   Home blood sugar records: not being checked   Episodes of hypoglycemia? No    Current insulin regiment: none Most Recent Eye Exam: 12/2019. Per pt  Hypertension, follow-up  BP Readings from Last 3 Encounters:  04/16/20 129/79  03/13/20 (!) 142/88  09/12/19 (!) 142/89   Wt Readings from Last 3 Encounters:  07/16/20 219 lb (99.3 kg)  04/16/20 208 lb 3.2 oz (94.4 kg)  03/13/20 206 lb (93.4 kg)     He was last seen for hypertension 4 months ago.  BP at that visit was 129/79. Management since that visit includes no medication changes. Continue to monitor BP at home.  He reports good compliance with treatment. He is not having side effects.  He is exercising. He is adherent to low salt diet.   Outside blood pressures are not being checked.  He  does not smoke.  Use of agents associated with hypertension: none.   Lipid/Cholesterol, follow-up  Last Lipid Panel: Lab Results  Component Value Date   CHOL 115 03/13/2020   LDLCALC 62 03/13/2020   HDL 33 (L) 03/13/2020   TRIG 106 03/13/2020    He was last seen for this 4 months ago.  Management since that visit includes no medication changes.  He reports good compliance with treatment. He is not having side effects.   Symptoms: No appetite changes No foot ulcerations  No chest pain No chest pressure/discomfort  No dyspnea No orthopnea  No fatigue No lower extremity edema  No palpitations No paroxysmal nocturnal dyspnea  No nausea No numbness or tingling of extremity  No polydipsia No polyuria  No speech difficulty No syncope   He is following a Regular diet. Current exercise: no regular exercise  Last metabolic panel Lab Results  Component Value Date   GLUCOSE 191 (H) 03/13/2020   NA 142 03/13/2020   K 5.2 03/13/2020   BUN 16 03/13/2020   CREATININE 1.03 03/13/2020   GFRNONAA 78 03/13/2020   GFRAA 90 03/13/2020   CALCIUM 9.6 03/13/2020   AST 15 03/13/2020   ALT 14 03/13/2020   The ASCVD Risk score Mikey Bussing DC Jr., et al., 2013) failed to calculate for the following  reasons:   The patient has a prior MI or stroke diagnosis       Medications: Outpatient Medications Prior to Visit  Medication Sig  . aspirin 81 MG tablet Take by mouth.  Marland Kitchen atorvastatin (LIPITOR) 80 MG tablet TAKE 1 TABLET BY MOUTH  DAILY AT 6PM  . lamoTRIgine (LAMICTAL) 25 MG tablet Take 1 tablet (25 mg total) by mouth daily.  Marland Kitchen levothyroxine (SYNTHROID) 150 MCG tablet TAKE 1 TABLET BY MOUTH  DAILY BEFORE BREAKFAST  . nitroGLYCERIN (NITROSTAT) 0.4 MG SL tablet Place 1 tablet (0.4 mg total) under the tongue every 5 (five) minutes x 3 doses as needed for chest pain.  . pioglitazone-metformin (ACTOPLUS MET) 15-850 MG tablet TAKE 1 TABLET BY MOUTH  TWICE DAILY  . ramipril (ALTACE) 2.5 MG capsule  TAKE 1 CAPSULE BY MOUTH  DAILY  . sotalol (BETAPACE) 120 MG tablet TAKE 1 TABLET BY MOUTH  TWICE DAILY  . temazepam (RESTORIL) 30 MG capsule TAKE 1 CAPSULE BY MOUTH AT BEDTIME AS NEEDED FOR SLEEP.  Marland Kitchen sertraline (ZOLOFT) 100 MG tablet Take 1 tablet (100 mg total) by mouth daily. (Patient not taking: Reported on 07/16/2020)   No facility-administered medications prior to visit.    Review of Systems  Constitutional: Negative for activity change and fatigue.  Respiratory: Negative for cough and shortness of breath.   Cardiovascular: Negative for chest pain, palpitations and leg swelling.  Musculoskeletal: Negative for arthralgias, myalgias, neck pain and neck stiffness.  Neurological: Negative for dizziness, light-headedness and headaches.        Objective    Temp 98.4 F (36.9 C)   Resp 16   Ht 5' 10"  (1.778 m)   Wt 219 lb (99.3 kg)   BMI 31.42 kg/m  BP Readings from Last 3 Encounters:  07/16/20 (!) 152/76  04/16/20 129/79  03/13/20 (!) 142/88   Wt Readings from Last 3 Encounters:  07/16/20 219 lb (99.3 kg)  04/16/20 208 lb 3.2 oz (94.4 kg)  03/13/20 206 lb (93.4 kg)       Physical Exam Vitals reviewed.  Constitutional:      Appearance: He is well-developed.  HENT:     Head: Normocephalic and atraumatic.     Right Ear: External ear normal.     Left Ear: External ear normal.     Nose: Nose normal.  Eyes:     General: No scleral icterus.    Conjunctiva/sclera: Conjunctivae normal.  Neck:     Thyroid: No thyromegaly.  Cardiovascular:     Rate and Rhythm: Normal rate and regular rhythm.     Heart sounds: Normal heart sounds.  Pulmonary:     Effort: Pulmonary effort is normal.     Breath sounds: Normal breath sounds.  Abdominal:     Palpations: Abdomen is soft.  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  Skin:    General: Skin is  warm and dry.  Neurological:     General: No focal deficit present.     Mental Status: He is alert and oriented to person, place, and time.     Cranial Nerves: No cranial nerve deficit.     Motor: No abnormal muscle tone.     Coordination: Coordination normal.     Comments: Diabetic foot exam normal.  Psychiatric:        Mood and Affect: Mood normal.  Behavior: Behavior normal.        Thought Content: Thought content normal.        Judgment: Judgment normal.     ECG reveals sinus rhythm with frequent PVCs and low voltage in the limb leads.  Results for orders placed or performed in visit on 07/16/20  POCT glycosylated hemoglobin (Hb A1C)  Result Value Ref Range   Hemoglobin A1C 8.3 (A) 4.0 - 5.6 %   HbA1c POC (<> result, manual entry)     HbA1c, POC (prediabetic range)     HbA1c, POC (controlled diabetic range)      Assessment & Plan     1. Type 2 diabetes mellitus with other specified complication, without long-term current use of insulin (HCC) A1c of 8.3 is a poor control.  Start either [place] or [place] today.  Patient wishes to not get a shot if possible.  Consider Trulicity or Ozempic on the next visit or in place of the above. - POCT glycosylated hemoglobin (Hb A1C)  2. Essential hypertension Fair control.  Patient to work on getting some exercise. - EKG 12-Lead  3. Frequent PVCs Has cardiology follow-up  4. Depression, major, recurrent, mild ([facility]) Patient declines to restart sertraline to go back to psychiatry.  5. Insomnia, unspecified type Refill temazepam. - temazepam (RESTORIL) 30 MG capsule; Take 1 capsule (30 mg total) by mouth at bedtime as needed for sleep.  Dispense: 30 capsule; Refill: 2  6. Coronary artery disease involving native coronary artery of native heart without angina pectoris All risk factors treated by cardiology They would like a new diabetes medicine to be started which is appropriate.   No follow-ups on file.       I, [name], MD, have reviewed all documentation for this visit. The documentation on 07/22/20 for the exam, diagnosis, procedures, and orders are all accurate and complete.    Yanitza Shvartsman Cranford Mon, MD  [facility] [phone] (phone) [phone] (fax)  [place]

## 2020-07-16 ENCOUNTER — Ambulatory Visit (INDEPENDENT_AMBULATORY_CARE_PROVIDER_SITE_OTHER): Payer: 59 | Admitting: Family Medicine

## 2020-07-16 ENCOUNTER — Encounter: Payer: Self-pay | Admitting: Family Medicine

## 2020-07-16 ENCOUNTER — Other Ambulatory Visit: Payer: Self-pay

## 2020-07-16 ENCOUNTER — Other Ambulatory Visit: Payer: Self-pay | Admitting: Family Medicine

## 2020-07-16 ENCOUNTER — Telehealth: Payer: Self-pay | Admitting: Family Medicine

## 2020-07-16 VITALS — BP 152/76 | HR 52 | Temp 98.4°F | Resp 16 | Ht 70.0 in | Wt 219.0 lb

## 2020-07-16 DIAGNOSIS — G4700 Insomnia, unspecified: Secondary | ICD-10-CM

## 2020-07-16 DIAGNOSIS — F330 Major depressive disorder, recurrent, mild: Secondary | ICD-10-CM

## 2020-07-16 DIAGNOSIS — E1169 Type 2 diabetes mellitus with other specified complication: Secondary | ICD-10-CM

## 2020-07-16 DIAGNOSIS — I493 Ventricular premature depolarization: Secondary | ICD-10-CM | POA: Diagnosis not present

## 2020-07-16 DIAGNOSIS — I2510 Atherosclerotic heart disease of native coronary artery without angina pectoris: Secondary | ICD-10-CM

## 2020-07-16 DIAGNOSIS — I10 Essential (primary) hypertension: Secondary | ICD-10-CM

## 2020-07-16 LAB — POCT GLYCOSYLATED HEMOGLOBIN (HGB A1C): Hemoglobin A1C: 8.3 % — AB (ref 4.0–5.6)

## 2020-07-16 MED ORDER — TEMAZEPAM 30 MG PO CAPS: 30.0000 mg | ORAL_CAPSULE | Freq: Every evening | ORAL | 2 refills | Status: DC | PRN

## 2020-07-16 MED ORDER — DAPAGLIFLOZIN PROPANEDIOL 5 MG PO TABS: 5.0000 mg | ORAL_TABLET | Freq: Every day | ORAL | 3 refills | Status: DC

## 2020-07-16 NOTE — Telephone Encounter (Signed)
Requested medication (s) are due for refill today:   Prescribed this morning by Dr. Rosanna Randy  Requested medication (s) are on the active medication list:   Yes  Future visit scheduled:   Yes   Last ordered: Today 07/16/2020.   Pharmacy requesting a prior auth.     Requested Prescriptions  Pending Prescriptions Disp Refills   FARXIGA 5 MG TABS tablet [Pharmacy Med Name: FARXIGA 5 MG TABLET] 30 tablet 3    Sig: TAKE 1 TABLET BY MOUTH DAILY BEFORE BREAKFAST.      Endocrinology:  Diabetes - SGLT2 Inhibitors Failed - 07/16/2020 10:33 AM      Failed - LDL in normal range and within 360 days    LDL Cholesterol (Calc)  Date Value Ref Range Status  01/05/2017 67 mg/dL (calc) Final    Comment:    Reference range: <100 . Desirable range <100 mg/dL for primary prevention;   <70 mg/dL for patients with CHD or diabetic patients  with > or = 2 CHD risk factors. Marland Kitchen LDL-C is now calculated using the Martin-Hopkins  calculation, which is a validated novel method providing  better accuracy than the Friedewald equation in the  estimation of LDL-C.  Cresenciano Genre et al. Annamaria Helling. 5329;924(26): 2061-2068  (http://education.[facility].com/faq/FAQ164)    LDL Chol Calc (NIH)  Date Value Ref Range Status  03/13/2020 62 0 - 99 mg/dL Final          Failed - HBA1C is between 0 and 7.9 and within 180 days    Hemoglobin A1C  Date Value Ref Range Status  07/16/2020 8.3 (A) 4.0 - 5.6 % Final   Hgb A1c MFr Bld  Date Value Ref Range Status  03/13/2020 7.8 (H) 4.8 - 5.6 % Final    Comment:             Prediabetes: 5.7 - 6.4          Diabetes: >6.4          Glycemic control for adults with diabetes: <7.0           Passed - Cr in normal range and within 360 days    Creat  Date Value Ref Range Status  01/05/2017 0.93 0.70 - 1.33 mg/dL Final    Comment:    For patients >87 years of age, the reference limit for Creatinine is approximately 13% higher for people identified as African-American. .     Creatinine, Ser  Date Value Ref Range Status  03/13/2020 1.03 0.76 - 1.27 mg/dL Final          Passed - eGFR in normal range and within 360 days    GFR, Est African American  Date Value Ref Range Status  01/05/2017 105 > OR = 60 mL/min/1.58m Final   GFR calc Af Amer  Date Value Ref Range Status  03/13/2020 90 >59 mL/min/1.73 Final    Comment:    **In accordance with recommendations from the NKF-ASN Task force,**   [facility] is in the process of updating its eGFR calculation to the   2021 CKD-EPI creatinine equation that estimates kidney function   without a race variable.    GFR, Est Non African American  Date Value Ref Range Status  01/05/2017 90 > OR = 60 mL/min/1.7107mFinal   GFR calc non Af Amer  Date Value Ref Range Status  03/13/2020 78 >59 mL/min/1.73 Final          Passed - Valid encounter within last 6 months    Recent  Outpatient Visits           Today Type 2 diabetes mellitus with other specified complication, without long-term current use of insulin [facility])   [facility] [name], MD   4 months ago Annual physical exam   [facility] [name], MD   10 months ago Type 2 diabetes mellitus with microalbuminuria, without long-term current use of insulin [facility])   [facility] [name], MD   1 year ago Type 2 diabetes mellitus with other specified complication, without long-term current use of insulin [facility])   [facility] [name], MD   1 year ago Annual physical exam   [name]       Future Appointments             In 2 months [name], MD [facility] [place]

## 2020-07-16 NOTE — Telephone Encounter (Signed)
Pt called back to report that insurance does not cover farxiga, the pharmacy states it is $600. Also, they never received his temazepam (RESTORIL) 30 MG capsule   Please advise, alternative medication options needed.   Best contact: [phone]

## 2020-07-16 NOTE — Telephone Encounter (Signed)
Requested medication (s) are due for refill today: yes  Requested medication (s) are on the active medication list: yes  Last refill:  Future visit scheduled: yes  Notes to clinic:  Pharmacy comment: Alternative Requested:PRIOR AUTH REQUIRED.    Requested Prescriptions  Pending Prescriptions Disp Refills   FARXIGA 5 MG TABS tablet [Pharmacy Med Name: FARXIGA 5 MG TABLET] 30 tablet 3    Sig: TAKE 1 TABLET BY MOUTH EVERY DAY BEFORE BREAKFAST      Endocrinology:  Diabetes - SGLT2 Inhibitors Failed - 07/16/2020 11:34 AM      Failed - LDL in normal range and within 360 days    LDL Cholesterol (Calc)  Date Value Ref Range Status  01/05/2017 67 mg/dL (calc) Final    Comment:    Reference range: <100 . Desirable range <100 mg/dL for primary prevention;   <70 mg/dL for patients with CHD or diabetic patients  with > or = 2 CHD risk factors. Marland Kitchen LDL-C is now calculated using the Martin-Hopkins  calculation, which is a validated novel method providing  better accuracy than the Friedewald equation in the  estimation of LDL-C.  Cresenciano Genre et al. Annamaria Helling. 4401;027(25): 2061-2068  (http://education.[facility].com/faq/FAQ164)    LDL Chol Calc (NIH)  Date Value Ref Range Status  03/13/2020 62 0 - 99 mg/dL Final          Failed - HBA1C is between 0 and 7.9 and within 180 days    Hemoglobin A1C  Date Value Ref Range Status  07/16/2020 8.3 (A) 4.0 - 5.6 % Final   Hgb A1c MFr Bld  Date Value Ref Range Status  03/13/2020 7.8 (H) 4.8 - 5.6 % Final    Comment:             Prediabetes: 5.7 - 6.4          Diabetes: >6.4          Glycemic control for adults with diabetes: <7.0           Passed - Cr in normal range and within 360 days    Creat  Date Value Ref Range Status  01/05/2017 0.93 0.70 - 1.33 mg/dL Final    Comment:    For patients >74 years of age, the reference limit for Creatinine is approximately 13% higher for people identified as African-American. .     Creatinine, Ser  Date Value Ref Range Status  03/13/2020 1.03 0.76 - 1.27 mg/dL Final          Passed - eGFR in normal range and within 360 days    GFR, Est African American  Date Value Ref Range Status  01/05/2017 105 > OR = 60 mL/min/1.65m Final   GFR calc Af Amer  Date Value Ref Range Status  03/13/2020 90 >59 mL/min/1.73 Final    Comment:    **In accordance with recommendations from the NKF-ASN Task force,**   [facility] is in the process of updating its eGFR calculation to the   2021 CKD-EPI creatinine equation that estimates kidney function   without a race variable.    GFR, Est Non African American  Date Value Ref Range Status  01/05/2017 90 > OR = 60 mL/min/1.765mFinal   GFR calc non Af Amer  Date Value Ref Range Status  03/13/2020 78 >59 mL/min/1.73 Final          Passed - Valid encounter within last 6 months    Recent Outpatient Visits  Today Type 2 diabetes mellitus with other specified complication, without long-term current use of insulin [facility])   [facility] [name], MD   4 months ago Annual physical exam   [facility] [name], MD   10 months ago Type 2 diabetes mellitus with microalbuminuria, without long-term current use of insulin [facility])   [facility] [name], MD   1 year ago Type 2 diabetes mellitus with other specified complication, without long-term current use of insulin [facility])   [facility] [name], MD   1 year ago Annual physical exam   [facility] [name], MD       Future Appointments             In 2 months [name], MD [facility] [place]

## 2020-07-17 MED ORDER — RAMIPRIL 5 MG PO CAPS: 5.0000 mg | ORAL_CAPSULE | Freq: Every day | ORAL | 1 refills | Status: DC

## 2020-07-17 NOTE — Telephone Encounter (Signed)
Let's increase the ramipril from 2.5 mg to 5 mg once daily.  Have him continue to check home BP twice daily and call in 2 weeks if not improved.

## 2020-07-22 ENCOUNTER — Other Ambulatory Visit: Payer: Self-pay

## 2020-07-22 ENCOUNTER — Other Ambulatory Visit: Payer: Self-pay | Admitting: *Deleted

## 2020-07-22 ENCOUNTER — Ambulatory Visit
Admission: RE | Admit: 2020-07-22 | Discharge: 2020-07-22 | Disposition: A | Discharge status: Home / Self Care | Payer: 59 | Attending: Family Medicine | Admitting: Family Medicine

## 2020-07-22 ENCOUNTER — Ambulatory Visit
Admission: RE | Admit: 2020-07-22 | Discharge: 2020-07-22 | Disposition: A | Discharge status: Home / Self Care | Payer: 59 | Source: Ambulatory Visit | Attending: Family Medicine | Admitting: Family Medicine

## 2020-07-22 DIAGNOSIS — E1169 Type 2 diabetes mellitus with other specified complication: Secondary | ICD-10-CM

## 2020-07-22 DIAGNOSIS — R109 Unspecified abdominal pain: Secondary | ICD-10-CM | POA: Insufficient documentation

## 2020-07-22 DIAGNOSIS — R0789 Other chest pain: Secondary | ICD-10-CM | POA: Insufficient documentation

## 2020-07-22 NOTE — Telephone Encounter (Signed)
Order for x-ray done and patient was advised.

## 2020-08-27 ENCOUNTER — Other Ambulatory Visit: Payer: Self-pay | Admitting: Family Medicine

## 2020-08-27 NOTE — Telephone Encounter (Signed)
Medication Refill - Medication: lamoTRIgine (LAMICTAL) 25 MG tablet  *Pt stated that the medication the pharmacy has been giving him was 200mg , pt requested if it could stay at 200mg . Pt also states if he could get a 90 refills for now on*  Has the patient contacted their pharmacy? No, patient had not been taking medication in a while   Preferred Pharmacy (with phone number or street name): [id number] [address], [place]  Phone: [phone] Fax: [phone]   Agent: Please be advised that RX refills may take up to 3 business days. We ask that you follow-up with your pharmacy.

## 2020-08-27 NOTE — Telephone Encounter (Signed)
Requested medication (s) are due for refill today: yes  Requested medication (s) are on the active medication list: yes  Last refill:  04/18/20  Future visit scheduled: yes  Notes to clinic:  not delegated    Requested Prescriptions  Pending Prescriptions Disp Refills   lamoTRIgine (LAMICTAL) 25 MG tablet 90 tablet 3    Sig: Take 1 tablet (25 mg total) by mouth daily.      Not Delegated - Neurology:  Anticonvulsants Failed - 08/27/2020 11:30 AM      Failed - This refill cannot be delegated      Passed - HCT in normal range and within 360 days    Hematocrit  Date Value Ref Range Status  03/13/2020 43.3 37.5 - 51.0 % Final          Passed - HGB in normal range and within 360 days    Hemoglobin  Date Value Ref Range Status  03/13/2020 14.9 13.0 - 17.7 g/dL Final          Passed - PLT in normal range and within 360 days    Platelets  Date Value Ref Range Status  03/13/2020 192 150 - 450 x10E3/uL Final          Passed - WBC in normal range and within 360 days    WBC  Date Value Ref Range Status  03/13/2020 8.2 3.4 - 10.8 x10E3/uL Final  10/06/2011 8.6 4.0 - 10.5 K/uL Final          Passed - Valid encounter within last 12 months    Recent Outpatient Visits           1 month ago Type 2 diabetes mellitus with other specified complication, without long-term current use of insulin [facility])   [facility] [name], MD   5 months ago Annual physical exam   [facility] [name], MD   11 months ago Type 2 diabetes mellitus with microalbuminuria, without long-term current use of insulin [facility])   [facility] [name], MD   1 year ago Type 2 diabetes mellitus with other specified complication, without long-term current use of insulin [facility])   [facility] [name], MD   1 year ago Annual physical exam   [facility] [name], MD        Future Appointments             In 1 month [name], MD [facility]

## 2020-08-28 MED ORDER — LAMOTRIGINE 25 MG PO TABS: 25.0000 mg | ORAL_TABLET | Freq: Every day | ORAL | 3 refills | Status: DC

## 2020-08-29 ENCOUNTER — Telehealth: Payer: Self-pay | Admitting: *Deleted

## 2020-08-29 ENCOUNTER — Telehealth: Payer: Self-pay

## 2020-08-29 NOTE — Telephone Encounter (Signed)
You wanted patient to start on Farxiga samples were given. Patient did not pick up from the pharmacy due to cost. Did you want to try Invokana? If so, what dose? Patient also needs refills on temazepam. Please advise. Thanks!

## 2020-08-29 NOTE — Telephone Encounter (Signed)
Rx for Lamictal 25 mg. Was sent in on 08/28/20 by Dr. Rosanna Randy but [facility]. [facility] [address] [employer].

## 2020-08-29 NOTE — Telephone Encounter (Signed)
Please review. Which dose should the patient be taking?

## 2020-08-29 NOTE — Telephone Encounter (Signed)
Copied from [place] [id number]. Topic: General - Inquiry >> Aug 29, 2020  9:44 AM Valere Dross wrote: Reason for CRM: Pt called in stated a few weeks back the PCP prescribed some diabetes med but the pharmacy stating they did not receive anything. Pt states he did not know the name either. Pt request a call back, please advise

## 2020-09-01 MED ORDER — LAMOTRIGINE 200 MG PO TABS: 200.0000 mg | ORAL_TABLET | Freq: Every day | ORAL | 1 refills | Status: DC

## 2020-09-01 NOTE — Telephone Encounter (Signed)
Medication sent into the pharmacy. 

## 2020-09-13 ENCOUNTER — Other Ambulatory Visit: Payer: Self-pay | Admitting: Family Medicine

## 2020-09-14 NOTE — Telephone Encounter (Signed)
Requested Prescriptions  Pending Prescriptions Disp Refills  . pioglitazone-metformin (ACTOPLUS MET) 15-850 MG tablet [Pharmacy Med Name: PIOGLIT  15MG 850MG  TAB  MET] 180 tablet 1    Sig: TAKE 1 TABLET BY MOUTH  TWICE DAILY     Endocrinology:  Diabetes - Biguanide + Pioglitazone Combo Failed - 09/13/2020 10:24 PM      Failed - HBA1C is between 0 and 7.9 and within 180 days    Hemoglobin A1C  Date Value Ref Range Status  07/16/2020 8.3 (A) 4.0 - 5.6 % Final   Hgb A1c MFr Bld  Date Value Ref Range Status  03/13/2020 7.8 (H) 4.8 - 5.6 % Final    Comment:             Prediabetes: 5.7 - 6.4          Diabetes: >6.4          Glycemic control for adults with diabetes: <7.0          Passed - Cr in normal range and within 360 days    Creat  Date Value Ref Range Status  01/05/2017 0.93 0.70 - 1.33 mg/dL Final    Comment:    For patients >77 years of age, the reference limit for Creatinine is approximately 13% higher for people identified as African-American. .    Creatinine, Ser  Date Value Ref Range Status  03/13/2020 1.03 0.76 - 1.27 mg/dL Final         Passed - eGFR in normal range and within 360 days    GFR, Est African American  Date Value Ref Range Status  01/05/2017 105 > OR = 60 mL/min/1.67m Final   GFR calc Af Amer  Date Value Ref Range Status  03/13/2020 90 >59 mL/min/1.73 Final    Comment:    **In accordance with recommendations from the NKF-ASN Task force,**   [facility] is in the process of updating its eGFR calculation to the   2021 CKD-EPI creatinine equation that estimates kidney function   without a race variable.    GFR, Est Non African American  Date Value Ref Range Status  01/05/2017 90 > OR = 60 mL/min/1.796mFinal   GFR calc non Af Amer  Date Value Ref Range Status  03/13/2020 78 >59 mL/min/1.73 Final         Passed - Valid encounter within last 6 months    Recent Outpatient Visits          2 months ago Type 2 diabetes mellitus with other  specified complication, without long-term current use of insulin [facility]   6 months ago Annual physical exam   [facility]   1 year ago Type 2 diabetes mellitus with microalbuminuria, without long-term current use of insulin ([facility]  [facility]   1 year ago Type 2 diabetes mellitus with other specified complication, without long-term current use of insulin ([facility]  [facility]   1 year ago Annual physical exam   [facility]      Future Appointments            In 3 weeks [facility] Banana [facility]

## 2020-09-17 ENCOUNTER — Other Ambulatory Visit: Payer: Self-pay | Admitting: Family Medicine

## 2020-10-08 ENCOUNTER — Other Ambulatory Visit: Payer: Self-pay

## 2020-10-08 ENCOUNTER — Encounter: Payer: Self-pay | Admitting: Family Medicine

## 2020-10-08 ENCOUNTER — Ambulatory Visit (INDEPENDENT_AMBULATORY_CARE_PROVIDER_SITE_OTHER): Payer: Self-pay | Admitting: Family Medicine

## 2020-10-08 VITALS — BP 139/82 | HR 68 | Temp 98.4°F | Resp 16 | Wt 212.0 lb

## 2020-10-08 DIAGNOSIS — I2510 Atherosclerotic heart disease of native coronary artery without angina pectoris: Secondary | ICD-10-CM

## 2020-10-08 DIAGNOSIS — E7849 Other hyperlipidemia: Secondary | ICD-10-CM

## 2020-10-08 DIAGNOSIS — E1169 Type 2 diabetes mellitus with other specified complication: Secondary | ICD-10-CM

## 2020-10-08 DIAGNOSIS — I10 Essential (primary) hypertension: Secondary | ICD-10-CM

## 2020-10-08 DIAGNOSIS — F330 Major depressive disorder, recurrent, mild: Secondary | ICD-10-CM

## 2020-10-08 DIAGNOSIS — G4700 Insomnia, unspecified: Secondary | ICD-10-CM

## 2020-10-08 MED ORDER — EMPAGLIFLOZIN 10 MG PO TABS: 10.0000 mg | ORAL_TABLET | Freq: Every day | ORAL | 1 refills | Status: DC

## 2020-10-08 MED ORDER — PIOGLITAZONE HCL-METFORMIN HCL 15-850 MG PO TABS
1.0000 | ORAL_TABLET | Freq: Two times a day (BID) | ORAL | 1 refills | Status: DC
Start: 2020-10-08 — End: 2021-05-13

## 2020-10-08 MED ORDER — TEMAZEPAM 30 MG PO CAPS: 30.0000 mg | ORAL_CAPSULE | Freq: Every evening | ORAL | 3 refills | Status: DC | PRN

## 2020-10-08 MED ORDER — ATORVASTATIN CALCIUM 80 MG PO TABS: 80.0000 mg | ORAL_TABLET | Freq: Every day | ORAL | 0 refills | Status: DC

## 2020-10-08 MED ORDER — LEVOTHYROXINE SODIUM 150 MCG PO TABS: 150.0000 ug | ORAL_TABLET | Freq: Every day | ORAL | 3 refills | Status: DC

## 2020-10-08 NOTE — Progress Notes (Signed)
Established patient visit   Patient: [name]   DOB: [birth year]   59 y.o. Male  MRN: [id number] Visit Date: 10/08/2020  Today's healthcare provider: [name], MD   Chief Complaint  Patient presents with   Diabetes   Hypertension   Hyperlipidemia   Subjective    HPI  Patient comes in today for follow-up.  He denies any depression but he is not motivated at all to exercise.  He has no complaints.  He has changed insurance so we can either try [place] or Vania Rea now.  Wilder Glade was  $600 a month when we last checked on last visit. Diabetes Mellitus Type II, follow-up  Lab Results  Component Value Date   HGBA1C 8.3 (A) 07/16/2020   HGBA1C 7.8 (H) 03/13/2020   HGBA1C 7.2 (A) 09/12/2019   Last seen for diabetes 3 months ago.  Management since then includes starting on Farxiga. Patient reports that medication was too expensive. He was never started on an alternative medication.   Home blood sugar records: trend: fluctuating a bit  Episodes of hypoglycemia? No    Current insulin regiment: none Most Recent Eye Exam: 01/03/2020  Hypertension, follow-up  BP Readings from Last 3 Encounters:  10/08/20 139/82  07/16/20 (!) 152/76  04/16/20 129/79   Wt Readings from Last 3 Encounters:  10/08/20 212 lb (96.2 kg)  07/16/20 219 lb (99.3 kg)  04/16/20 208 lb 3.2 oz (94.4 kg)     He was last seen for hypertension 3 months ago.  BP at that visit was 152/76. Management since that visit includes no medication changes. He reports good compliance with treatment. He is not having side effects.  He is not exercising. He is adherent to low salt diet.   Outside blood pressures are checked occasionally.  He does not smoke.  Use of agents associated with hypertension: none.   Lipid/Cholesterol, follow-up  Last Lipid Panel: Lab Results  Component Value Date   CHOL 115 03/13/2020   LDLCALC 62 03/13/2020   HDL 33 (L) 03/13/2020   TRIG 106 03/13/2020    He  was last seen for this 3 months ago.  Management since that visit includes no medication changes.  He reports good compliance with treatment. He is not having side effects.   Symptoms: No appetite changes No foot ulcerations  No chest pain No chest pressure/discomfort  No dyspnea No orthopnea  No fatigue No lower extremity edema  No palpitations No paroxysmal nocturnal dyspnea  No nausea No numbness or tingling of extremity  No polydipsia No polyuria  No speech difficulty No syncope   He is following a Regular diet. Current exercise: no regular exercise  Last metabolic panel Lab Results  Component Value Date   GLUCOSE 191 (H) 03/13/2020   NA 142 03/13/2020   K 5.2 03/13/2020   BUN 16 03/13/2020   CREATININE 1.03 03/13/2020   GFRNONAA 78 03/13/2020   GFRAA 90 03/13/2020   CALCIUM 9.6 03/13/2020   AST 15 03/13/2020   ALT 14 03/13/2020   The ASCVD Risk score Mikey Bussing DC Jr., et al., 2013) failed to calculate for the following reasons:   The patient has a prior MI or stroke diagnosis       Medications: Outpatient Medications Prior to Visit  Medication Sig   aspirin 81 MG tablet Take by mouth.   atorvastatin (LIPITOR) 80 MG tablet TAKE 1 TABLET BY MOUTH  DAILY AT 6PM   FARXIGA 5 MG TABS tablet  TAKE 1 TABLET BY MOUTH EVERY DAY BEFORE BREAKFAST   lamoTRIgine (LAMICTAL) 200 MG tablet Take 1 tablet (200 mg total) by mouth daily.   levothyroxine (SYNTHROID) 150 MCG tablet TAKE 1 TABLET BY MOUTH  DAILY BEFORE BREAKFAST   nitroGLYCERIN (NITROSTAT) 0.4 MG SL tablet Place 1 tablet (0.4 mg total) under the tongue every 5 (five) minutes x 3 doses as needed for chest pain.   pioglitazone-metformin (ACTOPLUS MET) 15-850 MG tablet TAKE 1 TABLET BY MOUTH  TWICE DAILY   ramipril (ALTACE) 5 MG capsule Take 1 capsule (5 mg total) by mouth daily.   sertraline (ZOLOFT) 100 MG tablet Take 1 tablet (100 mg total) by mouth daily. (Patient not taking: Reported on 07/16/2020)   sotalol (BETAPACE)  120 MG tablet TAKE 1 TABLET BY MOUTH  TWICE DAILY   temazepam (RESTORIL) 30 MG capsule Take 1 capsule (30 mg total) by mouth at bedtime as needed for sleep.   No facility-administered medications prior to visit.    Review of Systems  Constitutional:  Negative for appetite change, chills and fever.  Respiratory:  Negative for chest tightness, shortness of breath and wheezing.   Cardiovascular:  Negative for chest pain and palpitations.  Gastrointestinal:  Negative for abdominal pain, nausea and vomiting.   Last hemoglobin A1c Lab Results  Component Value Date   HGBA1C 8.3 (A) 07/16/2020       Objective    BP 139/82   Pulse 68   Temp 98.4 F (36.9 C)   Resp 16   Wt 212 lb (96.2 kg)   BMI 30.42 kg/m  BP Readings from Last 3 Encounters:  10/08/20 139/82  07/16/20 (!) 152/76  04/16/20 129/79   Wt Readings from Last 3 Encounters:  10/08/20 212 lb (96.2 kg)  07/16/20 219 lb (99.3 kg)  04/16/20 208 lb 3.2 oz (94.4 kg)       Physical Exam Vitals reviewed.  Constitutional:      Appearance: He is well-developed.  HENT:     Head: Normocephalic and atraumatic.     Right Ear: External ear normal.     Left Ear: External ear normal.     Nose: Nose normal.  Eyes:     General: No scleral icterus.    Conjunctiva/sclera: Conjunctivae normal.  Neck:     Thyroid: No thyromegaly.  Cardiovascular:     Rate and Rhythm: Normal rate and regular rhythm.     Heart sounds: Normal heart sounds.  Pulmonary:     Effort: Pulmonary effort is normal.     Breath sounds: Normal breath sounds.  Abdominal:     Palpations: Abdomen is soft.  Lymphadenopathy:     Cervical: No cervical adenopathy.  Skin:    General: Skin is warm and dry.  Neurological:     General: No focal deficit present.     Mental Status: He is alert and oriented to person, place, and time.     Cranial Nerves: No cranial nerve deficit.     Motor: No abnormal muscle tone.     Coordination: Coordination normal.   Psychiatric:        Mood and Affect: Mood normal.        Behavior: Behavior normal.        Thought Content: Thought content normal.        Judgment: Judgment normal.      No results found for any visits on 10/08/20.  Assessment & Plan     1. Type 2 diabetes mellitus with  other specified complication, without long-term current use of insulin ([place]) Too early for A1c.  Vania Rea is now covered on his new insurance so we will start that and follow-up in 3 months with A1c - empagliflozin (JARDIANCE) 10 MG TABS tablet; Take 1 tablet (10 mg total) by mouth daily before breakfast.  Dispense: 90 tablet; Refill: 1  2. Insomnia, unspecified type Patient states he cannot sleep without temazepam.  I told him he needs to start skipping this 1 or 2 nights a week and start to wean off of it over the future years. - temazepam (RESTORIL) 30 MG capsule; Take 1 capsule (30 mg total) by mouth at bedtime as needed for sleep.  Dispense: 30 capsule; Refill: 3  3. Coronary artery disease involving native coronary artery of native heart without angina pectoris All risk factors treated  4. Depression, major, recurrent, mild ([place]) Patient denies any depression.  He declines to see psychiatry going forward.  He is on Lamictal and sertraline in addition to the above-mentioned Restoril for sleep  5. Essential hypertension Controlled on ramipril  6. Other hyperlipidemia On atorvastatin 80   No follow-ups on file.      I, [name], MD, have reviewed all documentation for this visit. The documentation on 10/13/20 for the exam, diagnosis, procedures, and orders are all accurate and complete.    Khiry Pasquariello Cranford Mon, MD  [facility] [phone] (phone) [phone] (fax)  [place]

## 2020-10-10 ENCOUNTER — Telehealth: Payer: Self-pay | Admitting: Family Medicine

## 2020-10-10 NOTE — Telephone Encounter (Signed)
[facility] is calling in regards to a Rx for Lipitor and the patient has had a allergy to Crestor and they are wanting to make sure it is ok to fill  CB#  [phone] ref [id number]

## 2020-10-13 ENCOUNTER — Telehealth: Payer: Self-pay

## 2020-10-13 NOTE — Telephone Encounter (Signed)
Copied from [place] [phone]. Topic: General - Other >> Oct 13, 2020  2:35 PM Yvette Rack wrote: Reason for CRM: Randall Hiss with [facility] reports possible drug allergy regarding Rx for atorvastatin (LIPITOR) 80 MG tablet. Randall Hiss stated pt history shows allergy on Rx for rosuvastatin. Cb# [phone]  Invoice# [id number]

## 2020-10-14 NOTE — Telephone Encounter (Signed)
Returned call to [facility] and advised okay to fill per Dr. Rosanna Randy.

## 2020-10-20 ENCOUNTER — Telehealth: Payer: Self-pay | Admitting: Cardiology

## 2020-10-20 NOTE — Telephone Encounter (Signed)
*  STAT* If patient is at the pharmacy, call can be transferred to refill team.   1. Which medications need to be refilled? (please list name of each medication and dose if known)  ramipril (ALTACE) 5 MG capsule sotalol (BETAPACE) 120 MG tablet  2. Which pharmacy/location (including street and city if local pharmacy) is medication to be sent to?  [facility] [place] [place]  3. Do they need a 30 day or 90 day supply? 90 day supply  Pt is out of medication

## 2020-10-27 ENCOUNTER — Encounter: Payer: Self-pay | Admitting: Family Medicine

## 2020-10-29 MED ORDER — SOTALOL HCL 120 MG PO TABS: 120.0000 mg | ORAL_TABLET | Freq: Two times a day (BID) | ORAL | 1 refills | Status: DC

## 2020-10-29 MED ORDER — RAMIPRIL 5 MG PO CAPS: 5.0000 mg | ORAL_CAPSULE | Freq: Every day | ORAL | 1 refills | Status: DC

## 2020-10-29 NOTE — Addendum Note (Signed)
Addended by: Waylan Rocher on: 10/29/2020 06:35 PM   Modules accepted: Orders

## 2020-10-29 NOTE — Addendum Note (Signed)
Addended by: Waylan Rocher on: 10/29/2020 07:34 AM   Modules accepted: Orders

## 2020-11-10 NOTE — Telephone Encounter (Signed)
Dr. Rosanna Randy, Anastasio Auerbach is also not covered by his insurance. Anything else?

## 2020-12-10 ENCOUNTER — Other Ambulatory Visit: Payer: Self-pay | Admitting: Family Medicine

## 2020-12-10 NOTE — Telephone Encounter (Signed)
Requested medications are due for refill today.  A little soon.  Requested medications are on the active medications list.  yes  Last refill. 09/01/2020 #90 1 refill  Future visit scheduled.   yes  Notes to clinic.  Medication not delegated.

## 2021-01-12 ENCOUNTER — Other Ambulatory Visit: Payer: Self-pay

## 2021-01-12 ENCOUNTER — Encounter: Payer: Self-pay | Admitting: Family Medicine

## 2021-01-12 ENCOUNTER — Ambulatory Visit (INDEPENDENT_AMBULATORY_CARE_PROVIDER_SITE_OTHER): Payer: Self-pay | Admitting: Family Medicine

## 2021-01-12 VITALS — BP 155/94 | HR 62 | Resp 16 | Ht 70.0 in | Wt 209.0 lb

## 2021-01-12 DIAGNOSIS — M25511 Pain in right shoulder: Secondary | ICD-10-CM

## 2021-01-12 DIAGNOSIS — F3341 Major depressive disorder, recurrent, in partial remission: Secondary | ICD-10-CM

## 2021-01-12 DIAGNOSIS — F3176 Bipolar disorder, in full remission, most recent episode depressed: Secondary | ICD-10-CM

## 2021-01-12 DIAGNOSIS — L309 Dermatitis, unspecified: Secondary | ICD-10-CM

## 2021-01-12 DIAGNOSIS — Z23 Encounter for immunization: Secondary | ICD-10-CM

## 2021-01-12 DIAGNOSIS — F988 Other specified behavioral and emotional disorders with onset usually occurring in childhood and adolescence: Secondary | ICD-10-CM

## 2021-01-12 DIAGNOSIS — F411 Generalized anxiety disorder: Secondary | ICD-10-CM

## 2021-01-12 DIAGNOSIS — E1169 Type 2 diabetes mellitus with other specified complication: Secondary | ICD-10-CM

## 2021-01-12 DIAGNOSIS — I2510 Atherosclerotic heart disease of native coronary artery without angina pectoris: Secondary | ICD-10-CM

## 2021-01-12 DIAGNOSIS — I10 Essential (primary) hypertension: Secondary | ICD-10-CM

## 2021-01-12 LAB — POCT GLYCOSYLATED HEMOGLOBIN (HGB A1C)
Est. average glucose Bld gHb Est-mCnc: 203
Hemoglobin A1C: 8.7 % — AB (ref 4.0–5.6)

## 2021-01-12 MED ORDER — TRIAMCINOLONE ACETONIDE 0.1 % EX CREA
1.0000 | TOPICAL_CREAM | Freq: Two times a day (BID) | CUTANEOUS | 0 refills | Status: DC | PRN
Start: 2021-01-12 — End: 2022-12-14

## 2021-01-12 MED ORDER — NAPROXEN 500 MG PO TABS: 500.0000 mg | ORAL_TABLET | Freq: Two times a day (BID) | ORAL | 0 refills | Status: DC | PRN

## 2021-01-12 NOTE — Patient Instructions (Signed)
STOP JARDIANCE. CHECK WITH YOUR INSURANCE FOR OUT-OF-POCKET COST ON [place], [place], [place].

## 2021-01-12 NOTE — Progress Notes (Signed)
I,[name],acting as a scribe for [name], MD.,have documented all relevant documentation on the behalf of [name], MD,as directed by  [name], MD while in the presence of [name], MD.   Established patient visit   Patient: [name]   DOB: 1961-01-22   60 y.o. Male  MRN: [id number] Visit Date: 01/12/2021  Today's healthcare provider: [name], MD   Chief Complaint  Patient presents with   Follow-up   Diabetes   Subjective    HPI  Patient comes in today for follow-up of diabetes but has a couple of issues he needs to discuss. Jardiance and Wilder Glade have both been prescribed and they were more than $300-$400 per month for the single drug.  His diabetes is not well controlled as his A1c is 8.7 today. Is taking all of his medications otherwise as prescribed.  No anginal or heart symptoms.  No other issues regarding his cardiovascular disease or diabetes  He does complain of injuring himself pulled from [facility] -  recently with a history to his right shoulder.  He has some pain with abduction of the shoulder but it really hurts to reach behind him recently.  Another issue is mildly pruritic rash.  There is no drainage or sign of infection.  Mildly erythematous and flaky  Diabetes Mellitus Type II, Follow-up  Lab Results  Component Value Date   HGBA1C 8.7 (A) 01/12/2021   HGBA1C 8.3 (A) 07/16/2020   HGBA1C 7.8 (H) 03/13/2020   Wt Readings from Last 3 Encounters:  01/12/21 209 lb (94.8 kg)  10/08/20 212 lb (96.2 kg)  07/16/20 219 lb (99.3 kg)   Last seen for diabetes 3 months ago.  Management since then includes started Jardiance. He reports good compliance with treatment. He is not having side effects. none Home blood sugar records: fasting range: not checking  Episodes of hypoglycemia? No none   Current insulin regiment: n/a Most Recent Eye Exam: 01/02/21 Current exercise: walking Current diet habits:  well balanced  Pertinent Labs: Lab Results  Component Value Date   CHOL 115 03/13/2020   HDL 33 (L) 03/13/2020   LDLCALC 62 03/13/2020   TRIG 106 03/13/2020   CHOLHDL 3.5 03/13/2020   Lab Results  Component Value Date   NA 142 03/13/2020   K 5.2 03/13/2020   CREATININE 1.03 03/13/2020   GFRNONAA 78 03/13/2020   GLUCOSE 191 (H) 03/13/2020     ---------------------------------------------------------------------------------------------------     Medications: Outpatient Medications Prior to Visit  Medication Sig   aspirin 81 MG tablet Take by mouth.   atorvastatin (LIPITOR) 80 MG tablet Take 1 tablet (80 mg total) by mouth daily.   empagliflozin (JARDIANCE) 10 MG TABS tablet Take 1 tablet (10 mg total) by mouth daily before breakfast.   lamoTRIgine (LAMICTAL) 200 MG tablet TAKE 1 TABLET BY MOUTH EVERY DAY   levothyroxine (SYNTHROID) 150 MCG tablet Take 1 tablet (150 mcg total) by mouth daily before breakfast.   nitroGLYCERIN (NITROSTAT) 0.4 MG SL tablet Place 1 tablet (0.4 mg total) under the tongue every 5 (five) minutes x 3 doses as needed for chest pain.   pioglitazone-metformin (ACTOPLUS MET) 15-850 MG tablet Take 1 tablet by mouth 2 (two) times daily.   ramipril (ALTACE) 5 MG capsule Take 1 capsule (5 mg total) by mouth daily.   sotalol (BETAPACE) 120 MG tablet Take 1 tablet (120 mg total) by mouth 2 (two) times daily.   temazepam (RESTORIL) 30  MG capsule Take 1 capsule (30 mg total) by mouth at bedtime as needed for sleep.   [DISCONTINUED] sertraline (ZOLOFT) 100 MG tablet Take 1 tablet (100 mg total) by mouth daily. (Patient not taking: No sig reported)   No facility-administered medications prior to visit.    Review of Systems  Constitutional:  Negative for appetite change, chills and fever.  Respiratory:  Negative for chest tightness, shortness of breath and wheezing.   Cardiovascular:  Negative for chest pain and palpitations.  Gastrointestinal:  Negative for  abdominal pain, nausea and vomiting.      Objective    BP (!) 155/94 (BP Location: Left Arm, Patient Position: Sitting, Cuff Size: Large)   Pulse 62   Resp 16   Ht 5' 10"  (1.778 m)   Wt 209 lb (94.8 kg)   SpO2 98%   BMI 29.99 kg/m  {Show previous vital signs (optional):23777}  Physical Exam Vitals reviewed.  Constitutional:      Appearance: He is well-developed.  HENT:     Head: Normocephalic and atraumatic.     Right Ear: External ear normal.     Left Ear: External ear normal.     Nose: Nose normal.  Eyes:     General: No scleral icterus.    Conjunctiva/sclera: Conjunctivae normal.  Neck:     Thyroid: No thyromegaly.  Cardiovascular:     Rate and Rhythm: Normal rate and regular rhythm.     Heart sounds: Normal heart sounds.  Pulmonary:     Effort: Pulmonary effort is normal.     Breath sounds: Normal breath sounds.  Abdominal:     Palpations: Abdomen is soft.  Lymphadenopathy:     Cervical: No cervical adenopathy.  Skin:    General: Skin is warm and dry.  Neurological:     General: No focal deficit present.     Mental Status: He is alert and oriented to person, place, and time.     Cranial Nerves: No cranial nerve deficit.     Motor: No abnormal muscle tone.     Coordination: Coordination normal.  Psychiatric:        Mood and Affect: Mood normal.        Behavior: Behavior normal.        Thought Content: Thought content normal.        Judgment: Judgment normal.      Results for orders placed or performed in visit on 01/12/21  POCT glycosylated hemoglobin (Hb A1C)  Result Value Ref Range   Hemoglobin A1C 8.7 (A) 4.0 - 5.6 %   Est. average glucose Bld gHb Est-mCnc 203     Assessment & Plan     1. Type 2 diabetes mellitus with other specified complication, without long-term current use of insulin (HCC) A1c has increased from 8.3-8.7.  Like to try Trulicity or Ozempic May end up on a sulfonylurea just because of cost.  He states his mother does well on  glipizide - POCT glycosylated hemoglobin (Hb A1C)--8.7 TODAY.  2. Need for influenza vaccination  - Flu Vaccine QUAD 6+ mos PF IM (Fluarix Quad PF)  3. Acute pain of right shoulder He is got an acute rotator cuff 3.  Time we will carefully try naproxen and refer to physical therapy. - naproxen (NAPROSYN) 500 MG tablet; Take 1 tablet (500 mg total) by mouth 2 (two) times daily as needed.  Dispense: 60 tablet; Refill: 0 - Ambulatory referral to Physical Therapy  4. Eczema, unspecified type Treat as eczematous  rash. - triamcinolone cream (KENALOG) 0.1 %; Apply 1 application topically 2 (two) times daily as needed.  Dispense: 30 g; Refill: 0  5. Essential (primary) hypertension I was hoping that [place] or one of the SGLT2's would be covered as it would also help with blood pressure.  Probably increase the Altace to 10 mg in the future  6. Coronary artery disease involving native coronary artery of native heart without angina pectoris All risk factors treated.  7. Anxiety, generalized Clinically stable  8. Recurrent major depressive disorder, in partial remission ([place])   9. Bipolar disorder, in full remission, most recent episode depressed ([place]) No longer followed by psychiatry.  Patient states he is doing well.  He remains on Lamictal and temazepam for sleep.  10. Attention deficit disorder, unspecified hyperactivity presence No longer needs treatment as he is retired   Return in about 4 months (around 05/15/2021).      I, [name], MD, have reviewed all documentation for this visit. The documentation on 01/16/21 for the exam, diagnosis, procedures, and orders are all accurate and complete.    Addalynne Golding Cranford Mon, MD  [facility] [phone] (phone) [phone] (fax)  [place]

## 2021-01-20 ENCOUNTER — Other Ambulatory Visit: Payer: Self-pay | Admitting: *Deleted

## 2021-01-20 DIAGNOSIS — E1169 Type 2 diabetes mellitus with other specified complication: Secondary | ICD-10-CM

## 2021-01-20 MED ORDER — GLIPIZIDE 5 MG PO TABS: 5.0000 mg | ORAL_TABLET | Freq: Every day | ORAL | 1 refills | Status: DC

## 2021-01-27 DIAGNOSIS — L300 Nummular dermatitis: Secondary | ICD-10-CM | POA: Diagnosis not present

## 2021-01-31 ENCOUNTER — Other Ambulatory Visit: Payer: Self-pay | Admitting: Family Medicine

## 2021-02-01 NOTE — Telephone Encounter (Signed)
Requested Prescriptions  Pending Prescriptions Disp Refills  . atorvastatin (LIPITOR) 80 MG tablet [Pharmacy Med Name: ATORVASTATIN TABS 80MG ] 90 tablet 0    Sig: TAKE 1 TABLET DAILY     Cardiovascular:  Antilipid - Statins Failed - 01/31/2021  8:16 PM      Failed - HDL in normal range and within 360 days    HDL  Date Value Ref Range Status  03/13/2020 33 (L) >39 mg/dL Final         Passed - Total Cholesterol in normal range and within 360 days    Cholesterol, Total  Date Value Ref Range Status  03/13/2020 115 100 - 199 mg/dL Final         Passed - LDL in normal range and within 360 days    LDL Cholesterol (Calc)  Date Value Ref Range Status  01/05/2017 67 mg/dL (calc) Final    Comment:    Reference range: <100 . Desirable range <100 mg/dL for primary prevention;   <70 mg/dL for patients with CHD or diabetic patients  with > or = 2 CHD risk factors. Marland Kitchen LDL-C is now calculated using the Martin-Hopkins  calculation, which is a validated novel method providing  better accuracy than the Friedewald equation in the  estimation of LDL-C.  Cresenciano Genre et al. Annamaria Helling. 8099;833(82): 2061-2068  (http://education.[employer].com/faq/FAQ164)    LDL Chol Calc (NIH)  Date Value Ref Range Status  03/13/2020 62 0 - 99 mg/dL Final         Passed - Triglycerides in normal range and within 360 days    Triglycerides  Date Value Ref Range Status  03/13/2020 106 0 - 149 mg/dL Final         Passed - Patient is not pregnant      Passed - Valid encounter within last 12 months    Recent Outpatient Visits          2 weeks ago Type 2 diabetes mellitus with other specified complication, without long-term current use of insulin [facility])   [facility] [name], MD   3 months ago Type 2 diabetes mellitus with other specified complication, without long-term current use of insulin [facility])   [facility] [name], MD   6 months ago Type 2  diabetes mellitus with other specified complication, without long-term current use of insulin [facility])   [facility] [name], MD   10 months ago Annual physical exam   [facility] [name], MD   1 year ago Type 2 diabetes mellitus with microalbuminuria, without long-term current use of insulin [facility])   [facility] [name], MD      Future Appointments            In 2 months [name], Ander Slade, MD [facility], [facility]   In 3 months [name], MD [facility]

## 2021-02-05 ENCOUNTER — Other Ambulatory Visit: Payer: Self-pay | Admitting: Family Medicine

## 2021-02-05 DIAGNOSIS — G4700 Insomnia, unspecified: Secondary | ICD-10-CM

## 2021-02-06 NOTE — Telephone Encounter (Signed)
Requested medications are due for refill today one week early  Requested medications are on the active medication list yes  Last refill 01/12/21  Last visit 10/08/20  Future visit scheduled 05/20/21  Notes to clinic This medication can not be delegated, please assess. No UDS. Requested Prescriptions  Pending Prescriptions Disp Refills   temazepam (RESTORIL) 30 MG capsule [Pharmacy Med Name: TEMAZEPAM 30 MG CAPSULE] 30 capsule 3    Sig: TAKE 1 CAPSULE BY MOUTH AT BEDTIME AS NEEDED FOR SLEEP.     Not Delegated - Psychiatry:  Anxiolytics/Hypnotics Failed - 02/05/2021  8:04 PM      Failed - This refill cannot be delegated      Failed - Urine Drug Screen completed in last 360 days      Passed - Valid encounter within last 6 months    Recent Outpatient Visits           3 weeks ago Type 2 diabetes mellitus with other specified complication, without long-term current use of insulin [facility])   [facility] [name], MD   4 months ago Type 2 diabetes mellitus with other specified complication, without long-term current use of insulin [facility])   [facility] [name], MD   6 months ago Type 2 diabetes mellitus with other specified complication, without long-term current use of insulin [facility])   [facility] [name], MD   11 months ago Annual physical exam   [facility] [name], MD   1 year ago Type 2 diabetes mellitus with microalbuminuria, without long-term current use of insulin [facility])   [facility] [name], MD       Future Appointments             In 2 months [name], Ander Slade, MD [facility], [facility]   In 3 months [name], MD [facility], [facility]

## 2021-02-09 ENCOUNTER — Other Ambulatory Visit: Payer: Self-pay | Admitting: Family Medicine

## 2021-02-09 DIAGNOSIS — M25511 Pain in right shoulder: Secondary | ICD-10-CM

## 2021-02-09 NOTE — Telephone Encounter (Signed)
Requested Prescriptions  Pending Prescriptions Disp Refills  . naproxen (NAPROSYN) 500 MG tablet [Pharmacy Med Name: NAPROXEN 500 MG TABLET] 60 tablet 0    Sig: TAKE 1 TABLET BY MOUTH TWICE A DAY AS NEEDED     Analgesics:  NSAIDS Passed - 02/09/2021  1:26 AM      Passed - Cr in normal range and within 360 days    Creat  Date Value Ref Range Status  01/05/2017 0.93 0.70 - 1.33 mg/dL Final    Comment:    For patients >62 years of age, the reference limit for Creatinine is approximately 13% higher for people identified as African-American. .    Creatinine, Ser  Date Value Ref Range Status  03/13/2020 1.03 0.76 - 1.27 mg/dL Final         Passed - HGB in normal range and within 360 days    Hemoglobin  Date Value Ref Range Status  03/13/2020 14.9 13.0 - 17.7 g/dL Final         Passed - Patient is not pregnant      Passed - Valid encounter within last 12 months    Recent Outpatient Visits          4 weeks ago Type 2 diabetes mellitus with other specified complication, without long-term current use of insulin [facility])   [facility] [name], MD   4 months ago Type 2 diabetes mellitus with other specified complication, without long-term current use of insulin [facility])   [facility] [name], MD   6 months ago Type 2 diabetes mellitus with other specified complication, without long-term current use of insulin [facility])   [facility] [name], MD   11 months ago Annual physical exam   [facility] [name], MD   1 year ago Type 2 diabetes mellitus with microalbuminuria, without long-term current use of insulin [facility])   [facility] [name], MD      Future Appointments            In 2 months [name], Ander Slade, MD [facility], [facility]   In 3 months [name], MD [facility], [facility]

## 2021-03-25 ENCOUNTER — Encounter: Payer: Self-pay | Admitting: Family Medicine

## 2021-03-26 ENCOUNTER — Telehealth: Payer: Self-pay | Admitting: Family Medicine

## 2021-03-26 DIAGNOSIS — E1169 Type 2 diabetes mellitus with other specified complication: Secondary | ICD-10-CM

## 2021-03-26 MED ORDER — GLIPIZIDE 5 MG PO TABS: 5.0000 mg | ORAL_TABLET | Freq: Every day | ORAL | 1 refills | Status: DC

## 2021-03-26 NOTE — Telephone Encounter (Signed)
[place] faxed refill request for the following medications:  glipiZIDE (GLUCOTROL) 5 MG tablet   Please advise.

## 2021-04-03 NOTE — Progress Notes (Deleted)
[name] Date of Birth: 01/15/1961 Medical Record #[id number]  History of Present Illness: [name] is seen for followup CAD and VT . He has a significant history of coronary disease. He had his first myocardial infarction at age 61. At that time he had stenting of the proximal right coronary with a 4.0 x 13 mm MultiLink stent and the distal vessel with a 3.0 x 8 mm MultiLink stent. In June of 2010 he again had an acute inferior myocardial infarction related to occlusion of the distal right coronary. The distal vessel was stented with a 2.5 x 24 mm Taxus stent and a 2.75 x 20 mm Taxus stent at the crux. He had followup cardiac catheterization in September of 2010 which demonstrated nonobstructive disease and ejection fraction of 55%. His last evaluation with cardiac catheterization in July of 2013 showed nonobstructive disease as well.  He also has a history of symptomatic monomorphic ventricular tachycardia that has been treated with Betapace therapy- well controlled.  On followup today he states he is doing well from a cardiac standpoint. He is enjoying retirement. He denies palpitations, chest pain, SOB, or dizziness. Tolerating medication well.    Current Outpatient Medications on File Prior to Visit  Medication Sig Dispense Refill   aspirin 81 MG tablet Take by mouth.     atorvastatin (LIPITOR) 80 MG tablet TAKE 1 TABLET DAILY 90 tablet 0   glipiZIDE (GLUCOTROL) 5 MG tablet Take 1 tablet (5 mg total) by mouth daily before breakfast. 90 tablet 1   lamoTRIgine (LAMICTAL) 200 MG tablet TAKE 1 TABLET BY MOUTH EVERY DAY 90 tablet 1   levothyroxine (SYNTHROID) 150 MCG tablet Take 1 tablet (150 mcg total) by mouth daily before breakfast. 90 tablet 3   naproxen (NAPROSYN) 500 MG tablet TAKE 1 TABLET BY MOUTH TWICE A DAY AS NEEDED 60 tablet 0   nitroGLYCERIN (NITROSTAT) 0.4 MG SL tablet Place 1 tablet (0.4 mg total) under the tongue every 5 (five) minutes x 3 doses as needed for chest pain. 25  tablet 4   pioglitazone-metformin (ACTOPLUS MET) 15-850 MG tablet Take 1 tablet by mouth 2 (two) times daily. 180 tablet 1   ramipril (ALTACE) 5 MG capsule Take 1 capsule (5 mg total) by mouth daily. 90 capsule 1   sotalol (BETAPACE) 120 MG tablet Take 1 tablet (120 mg total) by mouth 2 (two) times daily. 180 tablet 1   temazepam (RESTORIL) 30 MG capsule TAKE 1 CAPSULE BY MOUTH AT BEDTIME AS NEEDED FOR SLEEP. 30 capsule 3   triamcinolone cream (KENALOG) 0.1 % Apply 1 application topically 2 (two) times daily as needed. 30 g 0   No current facility-administered medications on file prior to visit.    Allergies  Allergen Reactions   Crestor [Rosuvastatin Calcium]     ITCHING AND PALPITATIONS   Penicillins     As a child; took as adult and no reaction.    Past Medical History:  Diagnosis Date   Arthritis    Coronary artery disease    Diabetes mellitus    Dyslipidemia    Gout    History of tobacco use    Hypertension    Hypothyroidism    Old inferior myocardial infarction    Parathyroid adenoma    Sleep apnea    does not use cpap   Thyroid disease    Ventricular tachycardia    Symptomatic ventricular tachycardia    Past Surgical History:  Procedure Laterality Date   CARDIAC CATHETERIZATION  11/2008   nonobstructive CAD, left main normal, LAD normal, minor LCx irregularities <10%, mRCA 30% ISR, dRCA 30% narrowing proximal to PDA and PLB bifurcation; stented segments widely patent, LVEF 55%; severe inferior basal wall hypokinesis   CARDIAC CATHETERIZATION  08/2004   Acute inferior MI 2/2 dRCA occlusion s/p 2.5 x 24 mm Taxus stent and a 2.75 x 20 mm Taxus stent at the crux; LVEF 45%, moderate akinesis of the inferior wall   CARDIAC CATHETERIZATION  2000   pRCA 4.0 x 13 mm MultiLink stent, dRCA 3.0 x 8 mm MultiLink stent   CARDIAC CATHETERIZATION  10/06/11   normal left main, LAD, D1-3, LCx and OM1-OM3; 20% prox RCA stenosis, 40-50% ISR mid RCA, 20% ISR distal RCA; LVEF 45-50%;  mild basal inferior wall hypokinesis.   CHOLECYSTECTOMY     LEFT HEART CATHETERIZATION WITH CORONARY ANGIOGRAM N/A 10/06/2011   Procedure: LEFT HEART CATHETERIZATION WITH CORONARY ANGIOGRAM;  Surgeon: Wellington Hampshire, MD;  Location: [facility] CATH [facility];  Service: Cardiovascular;  Laterality: N/A;   OTHER SURGICAL HISTORY     Status post removal of parathyroid adenoma   parathroid      Social History   Tobacco Use  Smoking Status Former  Smokeless Tobacco Current   Types: Snuff    Social History   Substance and Sexual Activity  Alcohol Use No    Family History  Problem Relation Age of Onset   Heart attack Father    Stroke Father    Alzheimer's disease Father    Diabetes Mother     Review of Systems: As noted in history of present illness.  All other systems were reviewed and are negative.  Physical Exam: There were no vitals taken for this visit. GENERAL:  Well appearing WM in NAD HEENT:  PERRL, EOMI, sclera are clear. Oropharynx is clear. NECK:  No jugular venous distention, carotid upstroke brisk and symmetric, no bruits, no thyromegaly or adenopathy LUNGS:  Clear to auscultation bilaterally CHEST:  Unremarkable HEART:  RRR,  PMI not displaced or sustained,S1 and S2 within normal limits, no S3, no S4: no clicks, no rubs, no murmurs ABD:  Soft, nontender. BS +, no masses or bruits. No hepatomegaly, no splenomegaly EXT:  2 + pulses throughout, no edema, no cyanosis no clubbing SKIN:  Warm and dry.  No rashes NEURO:  Alert and oriented x 3. Cranial nerves II through XII intact. PSYCH:  Cognitively intact   LABORATORY DATA: Ecg today shows NSR with rate 65. Old inferior infarct. LAD.  QTc is normal at 453 msec. I have personally reviewed and interpreted this study.  Lab Results  Component Value Date   WBC 8.2 03/13/2020   HGB 14.9 03/13/2020   HCT 43.3 03/13/2020   PLT 192 03/13/2020   GLUCOSE 191 (H) 03/13/2020   CHOL 115 03/13/2020   TRIG 106 03/13/2020   HDL 33  (L) 03/13/2020   LDLCALC 62 03/13/2020   ALT 14 03/13/2020   AST 15 03/13/2020   NA 142 03/13/2020   K 5.2 03/13/2020   CL 101 03/13/2020   CREATININE 1.03 03/13/2020   BUN 16 03/13/2020   CO2 26 03/13/2020   TSH 3.160 03/13/2020   INR 1.10 10/05/2011   HGBA1C 8.7 (A) 01/12/2021   MICROALBUR 50 05/15/2019     Assessment / Plan: 1. Coronary disease with multiple interventions as noted in history of present illness. Last cath in 2013 showed nonobstructive disease. He is asymptomatic. Continue aspirin and statin therapy. Not on beta  blocker since he is on Betapace.   2. Ventricular tachycardia. This is well controlled with sotalol therapy. QTC is normal. He is asymptomatic. Labs are OK.  3. Dyslipidemia. Continue Lipitor high dose. LDL at goal < 70.   4. Diabetes mellitus type 2. A1c 7.8 on Actos/metformin. Would consider addition of  SGLT 2 inhibitor or GLP-1 inhibitor given CV benefits. Will defer to Dr Rosanna Randy.   5. Hypothyroidism. On replacement therapy.    I'll followup again in one year.Marland Kitchen

## 2021-04-14 ENCOUNTER — Other Ambulatory Visit: Payer: Self-pay | Admitting: Cardiology

## 2021-04-14 ENCOUNTER — Other Ambulatory Visit: Payer: Self-pay | Admitting: Family Medicine

## 2021-04-14 NOTE — Telephone Encounter (Signed)
Requested medications are due for refill today.  yes  Requested medications are on the active medications list.  yes  Last refill. 10/08/2020  Future visit scheduled.   yes  Notes to clinic.  Failed protocol d/t expired labs.    Requested Prescriptions  Pending Prescriptions Disp Refills   pioglitazone-metformin (ACTOPLUS MET) 15-850 MG tablet [Pharmacy Med Name: PIOGLITAZONE/METFORMIN TABS 15/850MG] 180 tablet 3    Sig: TAKE 1 TABLET TWICE A DAY     Endocrinology:  Diabetes - Biguanide + Pioglitazone Combo Failed - 04/14/2021 12:57 AM      Failed - Cr in normal range and within 360 days    Creat  Date Value Ref Range Status  01/05/2017 0.93 0.70 - 1.33 mg/dL Final    Comment:    For patients >80 years of age, the reference limit for Creatinine is approximately 13% higher for people identified as African-American. .    Creatinine, Ser  Date Value Ref Range Status  03/13/2020 1.03 0.76 - 1.27 mg/dL Final          Failed - HBA1C is between 0 and 7.9 and within 180 days    Hemoglobin A1C  Date Value Ref Range Status  01/12/2021 8.7 (A) 4.0 - 5.6 % Final   Hgb A1c MFr Bld  Date Value Ref Range Status  03/13/2020 7.8 (H) 4.8 - 5.6 % Final    Comment:             Prediabetes: 5.7 - 6.4          Diabetes: >6.4          Glycemic control for adults with diabetes: <7.0           Failed - eGFR in normal range and within 360 days    GFR, Est African American  Date Value Ref Range Status  01/05/2017 105 > OR = 60 mL/min/1.7m Final   GFR calc Af Amer  Date Value Ref Range Status  03/13/2020 90 >59 mL/min/1.73 Final    Comment:    **In accordance with recommendations from the NKF-ASN Task force,**   [facility] is in the process of updating its eGFR calculation to the   2021 CKD-EPI creatinine equation that estimates kidney function   without a race variable.    GFR, Est Non African American  Date Value Ref Range Status  01/05/2017 90 > OR = 60 mL/min/1.780mFinal    GFR calc non Af Amer  Date Value Ref Range Status  03/13/2020 78 >59 mL/min/1.73 Final          Passed - Valid encounter within last 6 months    Recent Outpatient Visits           3 months ago Type 2 diabetes mellitus with other specified complication, without long-term current use of insulin [facility]   6 months ago Type 2 diabetes mellitus with other specified complication, without long-term current use of insulin ([facility]   9 months ago Type 2 diabetes mellitus with other specified complication, without long-term current use of insulin ([facility]  [facility]   1 year ago Annual physical exam   [facility]   1 year ago Type 2 diabetes mellitus with microalbuminuria, without long-term current use of insulin ([facility]  Future Appointments             In 3 days [name], Ander Slade, MD [facility], [place]   In 1 month [name], MD [facility] [place]

## 2021-04-15 ENCOUNTER — Other Ambulatory Visit: Payer: Self-pay | Admitting: Family Medicine

## 2021-04-15 NOTE — Telephone Encounter (Signed)
Requested Prescriptions  Pending Prescriptions Disp Refills   atorvastatin (LIPITOR) 80 MG tablet [Pharmacy Med Name: ATORVASTATIN TABS 80MG ] 90 tablet 0    Sig: TAKE 1 TABLET DAILY     Cardiovascular:  Antilipid - Statins Failed - 04/15/2021 12:48 AM      Failed - Total Cholesterol in normal range and within 360 days    Cholesterol, Total  Date Value Ref Range Status  03/13/2020 115 100 - 199 mg/dL Final         Failed - LDL in normal range and within 360 days    LDL Cholesterol (Calc)  Date Value Ref Range Status  01/05/2017 67 mg/dL (calc) Final    Comment:    Reference range: <100 . Desirable range <100 mg/dL for primary prevention;   <70 mg/dL for patients with CHD or diabetic patients  with > or = 2 CHD risk factors. Marland Kitchen LDL-C is now calculated using the Martin-Hopkins  calculation, which is a validated novel method providing  better accuracy than the Friedewald equation in the  estimation of LDL-C.  Cresenciano Genre et al. Annamaria Helling. 4401;027(25): 2061-2068  (http://education.[employer].com/faq/FAQ164)    LDL Chol Calc (NIH)  Date Value Ref Range Status  03/13/2020 62 0 - 99 mg/dL Final         Failed - HDL in normal range and within 360 days    HDL  Date Value Ref Range Status  03/13/2020 33 (L) >39 mg/dL Final         Failed - Triglycerides in normal range and within 360 days    Triglycerides  Date Value Ref Range Status  03/13/2020 106 0 - 149 mg/dL Final         Passed - Patient is not pregnant      Passed - Valid encounter within last 12 months    Recent Outpatient Visits          3 months ago Type 2 diabetes mellitus with other specified complication, without long-term current use of insulin [facility])   [facility] [name], MD   6 months ago Type 2 diabetes mellitus with other specified complication, without long-term current use of insulin [facility])   [facility] [name], MD   9 months ago Type 2  diabetes mellitus with other specified complication, without long-term current use of insulin [facility])   [facility] [name], MD   1 year ago Annual physical exam   [facility] [name], MD   1 year ago Type 2 diabetes mellitus with microalbuminuria, without long-term current use of insulin [facility])   [facility] [name], MD      Future Appointments            In 2 days [name], [name], MD [facility], [facility]   In 1 month [name], MD [facility], [facility]

## 2021-04-17 ENCOUNTER — Ambulatory Visit: Payer: 59 | Admitting: Cardiology

## 2021-05-13 ENCOUNTER — Other Ambulatory Visit: Payer: Self-pay

## 2021-05-13 ENCOUNTER — Telehealth: Payer: Self-pay | Admitting: Family Medicine

## 2021-05-13 MED ORDER — ATORVASTATIN CALCIUM 80 MG PO TABS: 80.0000 mg | ORAL_TABLET | Freq: Every day | ORAL | 0 refills | Status: DC

## 2021-05-13 MED ORDER — PIOGLITAZONE HCL-METFORMIN HCL 15-850 MG PO TABS: 1.0000 | ORAL_TABLET | Freq: Two times a day (BID) | ORAL | 1 refills | Status: DC

## 2021-05-13 MED ORDER — RAMIPRIL 5 MG PO CAPS: 5.0000 mg | ORAL_CAPSULE | Freq: Every day | ORAL | 3 refills | Status: DC

## 2021-05-13 NOTE — Telephone Encounter (Signed)
[place] faxed refill request for the following medications:  pioglitazone-metformin (ACTOPLUS MET) 15-850 MG tablet   atorvastatin (LIPITOR) 80 MG tablet   Please advise.

## 2021-05-19 NOTE — Progress Notes (Signed)
? ?I,Elena D DeSanto,acting as a scribe for [name], MD.,have documented all relevant documentation on the behalf of [name], MD,as directed by  [name], MD while in the presence of [name], MD. ?  ? ? ?Established patient visit ? ? ?Patient: [name]   DOB: 1960/04/29   61 y.o. Male  MRN: [id number] ?Visit Date: 05/20/2021 ? ?Today's healthcare provider: [name], MD  ? ?No chief complaint on file. ? ?Subjective  ?  ?HPI  ?Patient comes in today for follow-up.  He has not been having any problems with his cardiac symptoms.  Not exercising.  Socially he feels well and has no complaints.  He is taking his medications.  States he was unable to afford the newer diabetic medicines and they were way too expensive for him. ?Diabetes Mellitus Type II, Follow-up ? ?Lab Results  ?Component Value Date  ? HGBA1C 8.7 (A) 01/12/2021  ? HGBA1C 8.3 (A) 07/16/2020  ? HGBA1C 7.8 (H) 03/13/2020  ? ?Wt Readings from Last 3 Encounters:  ?05/20/21 216 lb (98 kg)  ?01/12/21 209 lb (94.8 kg)  ?10/08/20 212 lb (96.2 kg)  ? ?Last seen for diabetes 4 months ago.  ?Management since then includes added glipizide. ?He reports good compliance with treatment. ?He is not having side effects.  ?Symptoms: ?No fatigue No foot ulcerations  ?No appetite changes No nausea  ?No paresthesia of the feet  No polydipsia  ?No polyuria No visual disturbances   ?No vomiting   ? ? ?Home blood sugar records:  not being checked ? ?Episodes of hypoglycemia? No  ?  ?Current insulin regiment: none ?Most Recent Eye Exam: due ? ? ?Pertinent Labs: ?Lab Results  ?Component Value Date  ? CHOL 115 03/13/2020  ? HDL 33 (L) 03/13/2020  ? [place] 62 03/13/2020  ? TRIG 106 03/13/2020  ? CHOLHDL 3.5 03/13/2020  ? Lab Results  ?Component Value Date  ? NA 142 03/13/2020  ? K 5.2 03/13/2020  ? CREATININE 1.03 03/13/2020  ? GFRNONAA 78 03/13/2020  ? MICROALBUR 50 05/15/2019  ?   ? ?--------------------------------------------------------------------------------------------------- ?Patient has not had routine labs since 2021 ? ?Medications: ?Outpatient Medications Prior to Visit  ?Medication Sig  ? aspirin 81 MG tablet Take by mouth.  ? atorvastatin (LIPITOR) 80 MG tablet Take 1 tablet (80 mg total) by mouth daily.  ? glipiZIDE (GLUCOTROL) 5 MG tablet Take 1 tablet (5 mg total) by mouth daily before breakfast.  ? lamoTRIgine (LAMICTAL) 200 MG tablet TAKE 1 TABLET BY MOUTH EVERY DAY  ? levothyroxine (SYNTHROID) 150 MCG tablet Take 1 tablet (150 mcg total) by mouth daily before breakfast.  ? nitroGLYCERIN (NITROSTAT) 0.4 MG SL tablet Place 1 tablet (0.4 mg total) under the tongue every 5 (five) minutes x 3 doses as needed for chest pain.  ? pioglitazone-metformin (ACTOPLUS MET) 15-850 MG tablet Take 1 tablet by mouth 2 (two) times daily.  ? ramipril (ALTACE) 5 MG capsule Take 1 capsule (5 mg total) by mouth daily.  ? sotalol (BETAPACE) 120 MG tablet Take 1 tablet (120 mg total) by mouth 2 (two) times daily. Schedule an appointment with Dr.Jordan for future refills. 1st attempt  ? temazepam (RESTORIL) 30 MG capsule TAKE 1 CAPSULE BY MOUTH AT BEDTIME AS NEEDED FOR SLEEP.  ? triamcinolone cream (KENALOG) 0.1 % Apply 1 application topically 2 (two) times daily as needed.  ? naproxen (NAPROSYN) 500 MG tablet TAKE 1 TABLET BY MOUTH TWICE A DAY AS  NEEDED (Patient not taking: Reported on 05/20/2021)  ? ?No facility-administered medications prior to visit.  ? ? ?Review of Systems ? ?  ?  Objective  ?  ?BP (!) 155/95 (BP Location: Left Arm, Patient Position: Sitting, Cuff Size: Normal)   Pulse 72   Temp 98 ?F (36.7 ?C) (Oral)   Wt 216 lb (98 kg)   SpO2 97%   BMI 30.99 kg/m?  ?BP Readings from Last 3 Encounters:  ?05/20/21 (!) 155/95  ?01/12/21 (!) 155/94  ?10/08/20 139/82  ? ?Wt Readings from Last 3 Encounters:  ?05/20/21 216 lb (98 kg)  ?01/12/21 209 lb (94.8 kg)  ?10/08/20 212 lb (96.2 kg)  ? ?   ? ?Physical Exam ?Vitals reviewed.  ?Constitutional:   ?   Appearance: He is well-developed.  ?HENT:  ?   Head: Normocephalic and atraumatic.  ?   Right Ear: External ear normal.  ?   Left Ear: External ear normal.  ?   Nose: Nose normal.  ?Eyes:  ?   General: No scleral icterus. ?   Conjunctiva/sclera: Conjunctivae normal.  ?Neck:  ?   Thyroid: No thyromegaly.  ?Cardiovascular:  ?   Rate and Rhythm: Normal rate and regular rhythm.  ?   Heart sounds: Normal heart sounds.  ?Pulmonary:  ?   Effort: Pulmonary effort is normal.  ?   Breath sounds: Normal breath sounds.  ?Abdominal:  ?   Palpations: Abdomen is soft.  ?Lymphadenopathy:  ?   Cervical: No cervical adenopathy.  ?Skin: ?   General: Skin is warm and dry.  ?Neurological:  ?   General: No focal deficit present.  ?   Mental Status: He is alert and oriented to person, place, and time.  ?   Cranial Nerves: No cranial nerve deficit.  ?   Motor: No abnormal muscle tone.  ?   Coordination: Coordination normal.  ?   Comments: Diabetic foot exam normal.  ?Psychiatric:     ?   Mood and Affect: Mood normal.     ?   Behavior: Behavior normal.     ?   Thought Content: Thought content normal.     ?   Judgment: Judgment normal.  ?  ? ? ?No results found for any visits on 05/20/21. ? Assessment & Plan  ?  ? ?1. Essential hypertension ?Check home blood pressure readings and bring in on next visit. ?- CBC with Differential/Platelet ?- Comprehensive metabolic panel ?- TSH ? ?2. Coronary artery disease involving native coronary artery of native heart without angina pectoris ?Factors treated ?- CBC with Differential/Platelet ? ?3. Bipolar disorder, in full remission, most recent episode depressed ([place]) ?Medically stable. ? ?4. Type 2 diabetes mellitus with other specified complication, without long-term current use of insulin ([place]) ?On glipizide and Actos/metformin ?- Hemoglobin A1c ? ?5. Prostate cancer screening ?Physical later this summer ?- PSA ? ?6. Hypothyroidism,  unspecified type ? ? ?7. Other hyperlipidemia ?Atorvastatin 80 maximum dose ?- Comprehensive metabolic panel ?- Lipid Panel With LDL/HDL Ratio ?- TSH ? ? ?No follow-ups on file.  ?   ? ?I, [name], MD, have reviewed all documentation for this visit. The documentation on 05/22/21 for the exam, diagnosis, procedures, and orders are all accurate and complete. ? ? ? ?Capria Cartaya Cranford Mon, MD  ?[facility] ?[phone] (phone) ?[phone] (fax) ? ?[facility] ?

## 2021-05-20 ENCOUNTER — Other Ambulatory Visit: Payer: Self-pay

## 2021-05-20 ENCOUNTER — Ambulatory Visit (INDEPENDENT_AMBULATORY_CARE_PROVIDER_SITE_OTHER): Payer: BC Managed Care – PPO | Admitting: Family Medicine

## 2021-05-20 VITALS — BP 155/95 | HR 72 | Temp 98.0°F | Wt 216.0 lb

## 2021-05-20 DIAGNOSIS — E1169 Type 2 diabetes mellitus with other specified complication: Secondary | ICD-10-CM

## 2021-05-20 DIAGNOSIS — I2510 Atherosclerotic heart disease of native coronary artery without angina pectoris: Secondary | ICD-10-CM | POA: Diagnosis not present

## 2021-05-20 DIAGNOSIS — E7849 Other hyperlipidemia: Secondary | ICD-10-CM

## 2021-05-20 DIAGNOSIS — F3176 Bipolar disorder, in full remission, most recent episode depressed: Secondary | ICD-10-CM | POA: Diagnosis not present

## 2021-05-20 DIAGNOSIS — Z125 Encounter for screening for malignant neoplasm of prostate: Secondary | ICD-10-CM

## 2021-05-20 DIAGNOSIS — E039 Hypothyroidism, unspecified: Secondary | ICD-10-CM

## 2021-05-20 DIAGNOSIS — I10 Essential (primary) hypertension: Secondary | ICD-10-CM | POA: Diagnosis not present

## 2021-06-08 ENCOUNTER — Other Ambulatory Visit: Payer: Self-pay | Admitting: Family Medicine

## 2021-06-08 DIAGNOSIS — G4700 Insomnia, unspecified: Secondary | ICD-10-CM

## 2021-06-18 DIAGNOSIS — Z125 Encounter for screening for malignant neoplasm of prostate: Secondary | ICD-10-CM | POA: Diagnosis not present

## 2021-06-18 DIAGNOSIS — E7849 Other hyperlipidemia: Secondary | ICD-10-CM | POA: Diagnosis not present

## 2021-06-18 DIAGNOSIS — E1169 Type 2 diabetes mellitus with other specified complication: Secondary | ICD-10-CM | POA: Diagnosis not present

## 2021-06-18 DIAGNOSIS — I10 Essential (primary) hypertension: Secondary | ICD-10-CM | POA: Diagnosis not present

## 2021-06-18 DIAGNOSIS — I2510 Atherosclerotic heart disease of native coronary artery without angina pectoris: Secondary | ICD-10-CM | POA: Diagnosis not present

## 2021-06-19 LAB — LIPID PANEL WITH LDL/HDL RATIO
Cholesterol, Total: 129 mg/dL (ref 100–199)
HDL: 30 mg/dL — ABNORMAL LOW (ref >39)
LDL Chol Calc (NIH): 77 mg/dL (ref 0–99)
LDL/HDL Ratio: 2.6 ratio (ref 0.0–3.6)
Triglycerides: 121 mg/dL (ref 0–149)
VLDL Cholesterol Cal: 22 mg/dL (ref 5–40)

## 2021-06-19 LAB — CBC WITH DIFFERENTIAL/PLATELET
Basophils Absolute: 0.1 10*3/uL (ref 0.0–0.2)
Basos: 1 %
EOS (ABSOLUTE): 0.5 10*3/uL — ABNORMAL HIGH (ref 0.0–0.4)
Eos: 6 %
Hematocrit: 44 % (ref 37.5–51.0)
Hemoglobin: 14.9 g/dL (ref 13.0–17.7)
Immature Grans (Abs): 0 10*3/uL (ref 0.0–0.1)
Immature Granulocytes: 0 %
Lymphocytes Absolute: 1.9 10*3/uL (ref 0.7–3.1)
Lymphs: 26 %
MCH: 32.7 pg (ref 26.6–33.0)
MCHC: 33.9 g/dL (ref 31.5–35.7)
MCV: 97 fL (ref 79–97)
Monocytes Absolute: 0.8 10*3/uL (ref 0.1–0.9)
Monocytes: 10 %
Neutrophils Absolute: 4.1 10*3/uL (ref 1.4–7.0)
Neutrophils: 57 %
Platelets: 232 10*3/uL (ref 150–450)
RBC: 4.56 x10E6/uL (ref 4.14–5.80)
RDW: 12.1 % (ref 11.6–15.4)
WBC: 7.3 10*3/uL (ref 3.4–10.8)

## 2021-06-19 LAB — COMPREHENSIVE METABOLIC PANEL
ALT: 14 IU/L (ref 0–44)
AST: 16 IU/L (ref 0–40)
Albumin/Globulin Ratio: 1.8 (ref 1.2–2.2)
Albumin: 4.4 g/dL (ref 3.8–4.8)
Alkaline Phosphatase: 119 IU/L (ref 44–121)
BUN/Creatinine Ratio: 13 (ref 10–24)
BUN: 13 mg/dL (ref 8–27)
Bilirubin Total: 0.4 mg/dL (ref 0.0–1.2)
CO2: 27 mmol/L (ref 20–29)
Calcium: 9.5 mg/dL (ref 8.6–10.2)
Chloride: 102 mmol/L (ref 96–106)
Creatinine, Ser: 1.02 mg/dL (ref 0.76–1.27)
Globulin, Total: 2.4 g/dL (ref 1.5–4.5)
Glucose: 167 mg/dL — ABNORMAL HIGH (ref 70–99)
Potassium: 5.3 mmol/L — ABNORMAL HIGH (ref 3.5–5.2)
Sodium: 142 mmol/L (ref 134–144)
Total Protein: 6.8 g/dL (ref 6.0–8.5)
eGFR: 83 mL/min/{1.73_m2} (ref >59)

## 2021-06-19 LAB — PSA: Prostate Specific Ag, Serum: 0.6 ng/mL (ref 0.0–4.0)

## 2021-06-19 LAB — HEMOGLOBIN A1C
Est. average glucose Bld gHb Est-mCnc: 160 mg/dL
Hgb A1c MFr Bld: 7.2 % — ABNORMAL HIGH (ref 4.8–5.6)

## 2021-06-19 LAB — TSH: TSH: 1.79 u[IU]/mL (ref 0.450–4.500)

## 2021-07-09 NOTE — Progress Notes (Signed)
? ?[name] ?Date of Birth: [birth year] ?Medical Record #[id number] ? ?History of Present Illness: ?[name] is seen for followup CAD and VT . He has a significant history of coronary disease. He had his first myocardial infarction at age 61. At that time he had stenting of the proximal right coronary with a 4.0 x 13 mm MultiLink stent and the distal vessel with a 3.0 x 8 mm MultiLink stent. In June of 2010 he again had an acute inferior myocardial infarction related to occlusion of the distal right coronary. The distal vessel was stented with a 2.5 x 24 mm Taxus stent and a 2.75 x 20 mm Taxus stent at the crux. He had followup cardiac catheterization in September of 2010 which demonstrated nonobstructive disease and ejection fraction of 55%. His last evaluation with cardiac catheterization in July of 2013 showed nonobstructive disease as well. ? He also has a history of symptomatic monomorphic ventricular tachycardia that has been treated with Betapace therapy- well controlled. ? ?On followup today he states he is doing well from a cardiac standpoint. He is retired.  He denies palpitations,  SOB, or dizziness. Rate vague chest discomfort. Tolerating medication well. Reports BP 130s/70s at rest at home. A1c had gone up to 8.7%. was considered for a number of medications including Trulicity, Ozempic, Jardiance but he could not afford. Went on glipizide and A1c came down to 7.2%.  ? ? ?Current Outpatient Medications on File Prior to Visit  ?Medication Sig Dispense Refill  ? aspirin 81 MG tablet Take by mouth.    ? atorvastatin (LIPITOR) 80 MG tablet Take 1 tablet (80 mg total) by mouth daily. 90 tablet 0  ? glipiZIDE (GLUCOTROL) 5 MG tablet Take 1 tablet (5 mg total) by mouth daily before breakfast. 90 tablet 1  ? lamoTRIgine (LAMICTAL) 200 MG tablet TAKE 1 TABLET BY MOUTH EVERY DAY 90 tablet 1  ? levothyroxine (SYNTHROID) 150 MCG tablet Take 1 tablet (150 mcg total) by mouth daily before breakfast. 90 tablet 3  ?  naproxen (NAPROSYN) 500 MG tablet TAKE 1 TABLET BY MOUTH TWICE A DAY AS NEEDED 60 tablet 0  ? pioglitazone-metformin (ACTOPLUS MET) 15-850 MG tablet Take 1 tablet by mouth 2 (two) times daily. 180 tablet 1  ? ramipril (ALTACE) 5 MG capsule Take 1 capsule (5 mg total) by mouth daily. 90 capsule 3  ? temazepam (RESTORIL) 30 MG capsule TAKE 1 CAPSULE BY MOUTH AT BEDTIME AS NEEDED FOR SLEEP 30 capsule 3  ? triamcinolone cream (KENALOG) 0.1 % Apply 1 application topically 2 (two) times daily as needed. 30 g 0  ? ?No current facility-administered medications on file prior to visit.  ? ? ?Allergies  ?Allergen Reactions  ? Crestor [Rosuvastatin Calcium]   ?  ITCHING AND PALPITATIONS  ? Penicillins   ?  As a child; took as adult and no reaction.  ? ? ?Past Medical History:  ?Diagnosis Date  ? Arthritis   ? Coronary artery disease   ? Diabetes mellitus   ? Dyslipidemia   ? Gout   ? History of tobacco use   ? Hypertension   ? Hypothyroidism   ? Old inferior myocardial infarction   ? Parathyroid adenoma   ? Sleep apnea   ? does not use cpap  ? Thyroid disease   ? Ventricular tachycardia ([place])   ? Symptomatic ventricular tachycardia  ? ? ?Past Surgical History:  ?Procedure Laterality Date  ? CARDIAC CATHETERIZATION  11/2008  ? nonobstructive CAD, left main  normal, LAD normal, minor LCx irregularities <10%, mRCA 30% ISR, dRCA 30% narrowing proximal to PDA and PLB bifurcation; stented segments widely patent, LVEF 55%; severe inferior basal wall hypokinesis  ? CARDIAC CATHETERIZATION  08/2004  ? Acute inferior MI 2/2 dRCA occlusion s/p 2.5 x 24 mm Taxus stent and a 2.75 x 20 mm Taxus stent at the crux; LVEF 45%, moderate akinesis of the inferior wall  ? CARDIAC CATHETERIZATION  2000  ? pRCA 4.0 x 13 mm MultiLink stent, dRCA 3.0 x 8 mm MultiLink stent  ? CARDIAC CATHETERIZATION  10/06/11  ? normal left main, LAD, D1-3, LCx and OM1-OM3; 20% prox RCA stenosis, 40-50% ISR mid RCA, 20% ISR distal RCA; LVEF 45-50%; mild basal inferior  wall hypokinesis.  ? CHOLECYSTECTOMY    ? LEFT HEART CATHETERIZATION WITH CORONARY ANGIOGRAM N/A 10/06/2011  ? Procedure: LEFT HEART CATHETERIZATION WITH CORONARY ANGIOGRAM;  Surgeon: Wellington Hampshire, MD;  Location: [facility];  Service: Cardiovascular;  Laterality: N/A;  ? OTHER SURGICAL HISTORY    ? Status post removal of parathyroid adenoma  ? parathroid    ? ? ?Social History  ? ?Tobacco Use  ?Smoking Status Former  ?Smokeless Tobacco Current  ? Types: Snuff  ? ? ?Social History  ? ?Substance and Sexual Activity  ?Alcohol Use No  ? ? ?Family History  ?Problem Relation Age of Onset  ? Heart attack Father   ? Stroke Father   ? Alzheimer's disease Father   ? Diabetes Mother   ? ? ?Review of Systems: ?As noted in history of present illness.  All other systems were reviewed and are negative. ? ?Physical Exam: ?BP (!) 142/88 (BP Location: Right Arm, Cuff Size: Normal)   Pulse 68   Ht 5' 10"  (1.778 m)   Wt 215 lb 12.8 oz (97.9 kg)   SpO2 98%   BMI 30.96 kg/m?  ?GENERAL:  Well appearing WM in NAD ?HEENT:  PERRL, EOMI, sclera are clear. Oropharynx is clear. ?NECK:  No jugular venous distention, carotid upstroke brisk and symmetric, no bruits, no thyromegaly or adenopathy ?LUNGS:  Clear to auscultation bilaterally ?CHEST:  Unremarkable ?HEART:  RRR,  PMI not displaced or sustained,S1 and S2 within normal limits, no S3, no S4: no clicks, no rubs, no murmurs ?ABD:  Soft, nontender. BS +, no masses or bruits. No hepatomegaly, no splenomegaly ?EXT:  2 + pulses throughout, no edema, no cyanosis no clubbing ?SKIN:  Warm and dry.  No rashes ?NEURO:  Alert and oriented x 3. Cranial nerves II through XII intact. ?PSYCH:  Cognitively intact ? ? ?LABORATORY DATA: ?Ecg today shows NSR with rate 68. Old inferior infarct. LAD.  QTc is normal at 448 msec. I have personally reviewed and interpreted this study. ? ?Lab Results  ?Component Value Date  ? WBC 7.3 06/18/2021  ? HGB 14.9 06/18/2021  ? HCT 44.0 06/18/2021  ? PLT 232  06/18/2021  ? GLUCOSE 167 (H) 06/18/2021  ? CHOL 129 06/18/2021  ? TRIG 121 06/18/2021  ? HDL 30 (L) 06/18/2021  ? [place] 77 06/18/2021  ? ALT 14 06/18/2021  ? AST 16 06/18/2021  ? NA 142 06/18/2021  ? K 5.3 (H) 06/18/2021  ? CL 102 06/18/2021  ? CREATININE 1.02 06/18/2021  ? BUN 13 06/18/2021  ? CO2 27 06/18/2021  ? TSH 1.790 06/18/2021  ? INR 1.10 10/05/2011  ? HGBA1C 7.2 (H) 06/18/2021  ? MICROALBUR 50 05/15/2019  ? ? ? ?Assessment / Plan: ?1. Coronary disease with multiple  interventions as noted in history of present illness. Last cath in 2013 showed nonobstructive disease. He has stable class 1 symptoms. Continue aspirin and statin therapy. Not on beta blocker since he is on Betapace.  ? ?2. Ventricular tachycardia. This is well controlled with sotalol therapy. QTC is normal. He is asymptomatic. Labs are OK. ? ?3. Dyslipidemia. Continue Lipitor high dose. LDL most recent 77. Encourage weight loss.  ? ?4. Diabetes mellitus type 2. A1c 7.2  on Actos/metformin and glipizide. Unable to afford a number of new therapies.  ? ?5. Hypothyroidism. On replacement therapy. TSH normal. ? ? ? I'll followup again in one year.. ? ?

## 2021-07-13 ENCOUNTER — Ambulatory Visit (INDEPENDENT_AMBULATORY_CARE_PROVIDER_SITE_OTHER): Payer: BC Managed Care – PPO | Admitting: Cardiology

## 2021-07-13 ENCOUNTER — Encounter: Payer: Self-pay | Admitting: Cardiology

## 2021-07-13 VITALS — BP 142/88 | HR 68 | Ht 70.0 in | Wt 215.8 lb

## 2021-07-13 DIAGNOSIS — E7800 Pure hypercholesterolemia, unspecified: Secondary | ICD-10-CM

## 2021-07-13 DIAGNOSIS — E1129 Type 2 diabetes mellitus with other diabetic kidney complication: Secondary | ICD-10-CM | POA: Diagnosis not present

## 2021-07-13 DIAGNOSIS — R809 Proteinuria, unspecified: Secondary | ICD-10-CM

## 2021-07-13 DIAGNOSIS — I2510 Atherosclerotic heart disease of native coronary artery without angina pectoris: Secondary | ICD-10-CM

## 2021-07-13 DIAGNOSIS — I4729 Other ventricular tachycardia: Secondary | ICD-10-CM

## 2021-07-13 MED ORDER — NITROGLYCERIN 0.4 MG SL SUBL: 0.4000 mg | SUBLINGUAL_TABLET | SUBLINGUAL | 4 refills | Status: DC | PRN

## 2021-07-13 MED ORDER — SOTALOL HCL 120 MG PO TABS
120.0000 mg | ORAL_TABLET | Freq: Two times a day (BID) | ORAL | 3 refills | Status: DC
Start: 2021-07-13 — End: 2022-05-14

## 2021-07-14 ENCOUNTER — Other Ambulatory Visit: Payer: Self-pay

## 2021-07-14 ENCOUNTER — Telehealth: Payer: Self-pay | Admitting: Family Medicine

## 2021-07-14 MED ORDER — LEVOTHYROXINE SODIUM 150 MCG PO TABS: 150.0000 ug | ORAL_TABLET | Freq: Every day | ORAL | 3 refills | Status: DC

## 2021-07-14 NOTE — Telephone Encounter (Signed)
[facility] faxed refill request for the following medications: ? ?levothyroxine (SYNTHROID) 150 MCG tablet  ? ?Please advise. ? ?

## 2021-07-17 ENCOUNTER — Other Ambulatory Visit: Payer: Self-pay | Admitting: Cardiology

## 2021-07-29 ENCOUNTER — Other Ambulatory Visit: Payer: Self-pay | Admitting: Family Medicine

## 2021-08-02 ENCOUNTER — Other Ambulatory Visit: Payer: Self-pay | Admitting: Family Medicine

## 2021-08-02 DIAGNOSIS — E1169 Type 2 diabetes mellitus with other specified complication: Secondary | ICD-10-CM

## 2021-08-25 ENCOUNTER — Other Ambulatory Visit: Payer: Self-pay | Admitting: Family Medicine

## 2021-08-27 ENCOUNTER — Other Ambulatory Visit: Payer: Self-pay

## 2021-08-27 MED ORDER — NITROGLYCERIN 0.4 MG SL SUBL: 0.4000 mg | SUBLINGUAL_TABLET | SUBLINGUAL | 4 refills | Status: DC | PRN

## 2021-09-30 ENCOUNTER — Encounter: Payer: BC Managed Care – PPO | Admitting: Family Medicine

## 2021-10-06 ENCOUNTER — Other Ambulatory Visit: Payer: Self-pay | Admitting: Family Medicine

## 2021-10-06 DIAGNOSIS — G4700 Insomnia, unspecified: Secondary | ICD-10-CM

## 2021-10-07 NOTE — Telephone Encounter (Signed)
Requested medication (s) are due for refill today: yes  Requested medication (s) are on the active medication list: yes  Last refill:  06/09/21 #30 3 RF  Future visit scheduled: yes  Notes to clinic:  Med not delegated to NT to RF    Requested Prescriptions  Pending Prescriptions Disp Refills   temazepam (RESTORIL) 30 MG capsule [Pharmacy Med Name: TEMAZEPAM 30 MG CAPSULE] 30 capsule 3    Sig: TAKE 1 CAPSULE BY MOUTH AT BEDTIME AS NEEDED FOR SLEEP     Not Delegated - Psychiatry: Anxiolytics/Hypnotics 2 Failed - 10/06/2021 11:12 AM      Failed - This refill cannot be delegated      Failed - Urine Drug Screen completed in last 360 days      Passed - Patient is not pregnant      Passed - Valid encounter within last 6 months    Recent Outpatient Visits           4 months ago Essential hypertension   [facility] [name]., MD   8 months ago Type 2 diabetes mellitus with other specified complication, without long-term current use of insulin [facility])   [facility] [name]., MD   12 months ago Type 2 diabetes mellitus with other specified complication, without long-term current use of insulin [facility])   [facility] [name]., MD   1 year ago Type 2 diabetes mellitus with other specified complication, without long-term current use of insulin [facility])   [facility] [name]., MD   1 year ago Annual physical exam   [facility] [name]., MD       Future Appointments             In 1 month [name]., MD [facility] [place]

## 2021-10-13 ENCOUNTER — Other Ambulatory Visit: Payer: Self-pay | Admitting: Family Medicine

## 2021-11-24 ENCOUNTER — Other Ambulatory Visit: Payer: Self-pay | Admitting: Family Medicine

## 2021-11-24 DIAGNOSIS — E1169 Type 2 diabetes mellitus with other specified complication: Secondary | ICD-10-CM

## 2021-11-25 ENCOUNTER — Ambulatory Visit (INDEPENDENT_AMBULATORY_CARE_PROVIDER_SITE_OTHER): Payer: BC Managed Care – PPO | Admitting: Family Medicine

## 2021-11-25 ENCOUNTER — Encounter: Payer: Self-pay | Admitting: Family Medicine

## 2021-11-25 VITALS — BP 144/82 | HR 59 | Resp 16 | Ht 70.0 in | Wt 209.0 lb

## 2021-11-25 DIAGNOSIS — Z23 Encounter for immunization: Secondary | ICD-10-CM

## 2021-11-25 DIAGNOSIS — L309 Dermatitis, unspecified: Secondary | ICD-10-CM

## 2021-11-25 DIAGNOSIS — E039 Hypothyroidism, unspecified: Secondary | ICD-10-CM

## 2021-11-25 DIAGNOSIS — Z0000 Encounter for general adult medical examination without abnormal findings: Secondary | ICD-10-CM

## 2021-11-25 DIAGNOSIS — Z1211 Encounter for screening for malignant neoplasm of colon: Secondary | ICD-10-CM | POA: Diagnosis not present

## 2021-11-25 DIAGNOSIS — F3176 Bipolar disorder, in full remission, most recent episode depressed: Secondary | ICD-10-CM

## 2021-11-25 DIAGNOSIS — I2510 Atherosclerotic heart disease of native coronary artery without angina pectoris: Secondary | ICD-10-CM | POA: Diagnosis not present

## 2021-11-25 DIAGNOSIS — E1169 Type 2 diabetes mellitus with other specified complication: Secondary | ICD-10-CM

## 2021-11-25 DIAGNOSIS — I10 Essential (primary) hypertension: Secondary | ICD-10-CM

## 2021-11-25 MED ORDER — BLOOD GLUCOSE TEST VI STRP: ORAL_STRIP | 11 refills | Status: AC

## 2021-11-25 MED ORDER — GLIPIZIDE 5 MG PO TABS: 5.0000 mg | ORAL_TABLET | Freq: Every day | ORAL | 3 refills | Status: AC

## 2021-11-25 NOTE — Progress Notes (Unsigned)
Complete physical exam  I,[name],acting as a scribe for Jeremiah Durie, MD.,have documented all relevant documentation on the behalf of Jeremiah Durie, MD,as directed by  Jeremiah Durie, MD while in the presence of Jeremiah Durie, MD.   Patient: [name]   DOB: [birth year]   61 y.o. Male  MRN: [id number] Visit Date: 11/25/2021  Today's healthcare provider: [name], MD   Chief Complaint  Patient presents with   Annual Exam   Subjective    [name] is a 60 y.o. male who presents today for a complete physical exam.  He reports consuming a general diet. The patient does not participate in regular exercise at present. He generally feels fairly well. He reports sleeping fairly well. He does not have additional problems to discuss today.  General patient.  Motivated to do much but he states that has always been the case throughout his entire life.  He is enjoying retirement. HPI    Past Medical History:  Diagnosis Date   Arthritis    Coronary artery disease    Diabetes mellitus    Dyslipidemia    Gout    History of tobacco use    Hypertension    Hypothyroidism    Old inferior myocardial infarction    Parathyroid adenoma    Sleep apnea    does not use cpap   Thyroid disease    Ventricular tachycardia ([place])    Symptomatic ventricular tachycardia   Past Surgical History:  Procedure Laterality Date   CARDIAC CATHETERIZATION  11/2008   nonobstructive CAD, left main normal, LAD normal, minor LCx irregularities <10%, mRCA 30% ISR, dRCA 30% narrowing proximal to PDA and PLB bifurcation; stented segments widely patent, LVEF 55%; severe inferior basal wall hypokinesis   CARDIAC CATHETERIZATION  08/2004   Acute inferior MI 2/2 dRCA occlusion s/p 2.5 x 24 mm Taxus stent and a 2.75 x 20 mm Taxus stent at the crux; LVEF 45%, moderate akinesis of the inferior wall   CARDIAC CATHETERIZATION  2000   pRCA 4.0 x 13 mm MultiLink stent, dRCA 3.0 x  8 mm MultiLink stent   CARDIAC CATHETERIZATION  10/06/11   normal left main, LAD, D1-3, LCx and OM1-OM3; 20% prox RCA stenosis, 40-50% ISR mid RCA, 20% ISR distal RCA; LVEF 45-50%; mild basal inferior wall hypokinesis.   CHOLECYSTECTOMY     LEFT HEART CATHETERIZATION WITH CORONARY ANGIOGRAM N/A 10/06/2011   Procedure: LEFT HEART CATHETERIZATION WITH CORONARY ANGIOGRAM;  Surgeon: Jeremiah Hampshire, MD;  Location: [facility] C[facility];  Service: Cardiovascular;  Laterality: N/A;   OTHER SURGICAL HISTORY     Status post removal of parathyroid adenoma   parathroid     Social History   Socioeconomic History   Marital status: Married    Spouse name: Not on file   Number of children: 3   Years of education: Not on file   Highest education level: Not on file  Occupational History   Occupation: [employer]  Tobacco Use   Smoking status: Former   Smokeless tobacco: Current    Types: Snuff  Vaping Use   Vaping Use: Never used  Substance and Sexual Activity   Alcohol use: No   Drug use: No   Sexual activity: Yes  Other Topics Concern   Not on file  Social History Narrative   Lives in [place], [place] with wife.    Social Determinants of Health   Financial Resource Strain: Not on  file  Food Insecurity: Not on file  Transportation Needs: Not on file  Physical Activity: Not on file  Stress: Not on file  Social Connections: Not on file  Intimate Partner Violence: Not on file   Family Status  Relation Name Status   Father  Deceased at age 29   Mother  82   Family History  Problem Relation Age of Onset   Heart attack Father    Stroke Father    Alzheimer's disease Father    Diabetes Mother    Allergies  Allergen Reactions   Crestor [Rosuvastatin Calcium]     ITCHING AND PALPITATIONS   Penicillins     As a child; took as adult and no reaction.    Patient Care Team: [name], MD as PCP - General (Unknown Physician Specialty)   Medications: Outpatient Medications  Prior to Visit  Medication Sig   aspirin 81 MG tablet Take by mouth.   atorvastatin (LIPITOR) 80 MG tablet Take 1 tablet by mouth daily.  **Please attend upcoming office visit with labs scheduled for 05/20/21 for further refills.**   lamoTRIgine (LAMICTAL) 200 MG tablet TAKE 1 TABLET BY MOUTH EVERY DAY   levothyroxine (SYNTHROID) 150 MCG tablet Take 1 tablet (150 mcg total) by mouth daily before breakfast.   naproxen (NAPROSYN) 500 MG tablet TAKE 1 TABLET BY MOUTH TWICE A DAY AS NEEDED   nitroGLYCERIN (NITROSTAT) 0.4 MG SL tablet Place 1 tablet (0.4 mg total) under the tongue every 5 (five) minutes x 3 doses as needed for chest pain.   pioglitazone-metformin (ACTOPLUS MET) 15-850 MG tablet Take 1 tablet by mouth twice daily.   ramipril (ALTACE) 5 MG capsule Take 1 capsule (5 mg total) by mouth daily.   sotalol (BETAPACE) 120 MG tablet Take 1 tablet (120 mg total) by mouth 2 (two) times daily. Schedule an appointment with Dr.Jordan for future refills. 1st attempt   SOTALOL AF 120 MG TABS Take 1 tablet by mouth twice daily.   temazepam (RESTORIL) 30 MG capsule TAKE 1 CAPSULE BY MOUTH AT BEDTIME AS NEEDED FOR SLEEP   triamcinolone cream (KENALOG) 0.1 % Apply 1 application topically 2 (two) times daily as needed.   [DISCONTINUED] glipiZIDE (GLUCOTROL) 5 MG tablet Take 1 tablet by mouth daily before breakfast.   No facility-administered medications prior to visit.    Review of Systems  HENT:  Positive for tinnitus.   All other systems reviewed and are negative.      Objective     BP (!) 144/82 (BP Location: Right Arm, Patient Position: Sitting, Cuff Size: Large)   Pulse (!) 59   Resp 16   Ht 5' 10"  (1.778 m)   Wt 209 lb (94.8 kg)   SpO2 95%   BMI 29.99 kg/m  BP Readings from Last 3 Encounters:  11/25/21 (!) 144/82  07/13/21 (!) 142/88  05/20/21 (!) 155/95   Wt Readings from Last 3 Encounters:  11/25/21 209 lb (94.8 kg)  07/13/21 215 lb 12.8 oz (97.9 kg)  05/20/21 216 lb (98 kg)        Physical Exam Vitals reviewed.  Constitutional:      Appearance: He is well-developed.  HENT:     Head: Normocephalic and atraumatic.     Right Ear: External ear normal.     Left Ear: External ear normal.     Nose: Nose normal.  Eyes:     General: No scleral icterus.    Conjunctiva/sclera: Conjunctivae normal.  Neck:  Thyroid: No thyromegaly.  Cardiovascular:     Rate and Rhythm: Normal rate and regular rhythm.     Heart sounds: Normal heart sounds.  Pulmonary:     Effort: Pulmonary effort is normal.     Breath sounds: Normal breath sounds.  Abdominal:     Palpations: Abdomen is soft.  Genitourinary:    Penis: Normal.      Testes: Normal.  Lymphadenopathy:     Cervical: No cervical adenopathy.  Skin:    General: Skin is warm and dry.  Neurological:     General: No focal deficit present.     Mental Status: He is alert and oriented to person, place, and time.     Cranial Nerves: No cranial nerve deficit.     Motor: No abnormal muscle tone.     Coordination: Coordination normal.     Comments: Diabetic foot exam normal.  Psychiatric:        Mood and Affect: Mood normal.        Behavior: Behavior normal.        Thought Content: Thought content normal.        Judgment: Judgment normal.       Last depression screening scores    11/25/2021    9:31 AM 05/20/2021   11:05 AM 01/12/2021   10:43 AM  PHQ 2/9 Scores  PHQ - 2 Score 0 0 0  PHQ- 9 Score 0 0 3   Last fall risk screening    11/25/2021    9:30 AM  [place] in the past year? 0  Number falls in past yr: 0  Injury with Fall? 0  Risk for fall due to : No Fall Risks  Follow up Falls evaluation completed   Last Audit-C alcohol use screening    11/25/2021    9:30 AM  Alcohol Use Disorder Test (AUDIT)  1. How often do you have a drink containing alcohol? 0  2. How many drinks containing alcohol do you have on a typical day when you are drinking? 0  3. How often do you have six or more drinks  on one occasion? 0  AUDIT-C Score 0   A score of 3 or more in women, and 4 or more in men indicates increased risk for alcohol abuse, EXCEPT if all of the points are from question 1   Results for orders placed or performed in visit on 11/25/21  POCT glycosylated hemoglobin (Hb A1C)  Result Value Ref Range   Hemoglobin A1C 6.5 (A) 4.0 - 5.6 %   Est. average glucose Bld gHb Est-mCnc 140     Assessment & Plan    Routine Health Maintenance and Physical Exam  Exercise Activities and Dietary recommendations  Goals   None     Immunization History  Administered Date(s) Administered   Influenza,inj,Quad PF,6+ Mos 02/03/2016, 02/08/2019, 01/12/2021, 11/25/2021   PFIZER(Purple Top)SARS-COV-2 Vaccination 07/16/2019, 08/07/2019   PNEUMOCOCCAL CONJUGATE-20 11/25/2021   Tdap 12/17/2010, 11/25/2021    Health Maintenance  Topic Date Due   FOOT EXAM  Never done   HIV Screening  Never done   Zoster Vaccines- Shingrix (1 of 2) Never done   Diabetic kidney evaluation - Urine ACR  08/04/2017   COVID-19 Vaccine (3 - Pfizer risk series) 09/04/2019   OPHTHALMOLOGY EXAM  01/02/2021   Fecal DNA (Cologuard)  01/21/2022   HEMOGLOBIN A1C  05/31/2022   Diabetic kidney evaluation - GFR measurement  06/19/2022   TETANUS/TDAP  11/26/2031  INFLUENZA VACCINE  Completed   Hepatitis C Screening  Completed   HPV VACCINES  Aged Out    Discussed health benefits of physical activity, and encouraged him to engage in regular exercise appropriate for his age and condition.  1. Annual physical exam Patient has never had colonoscopy.  Willing to do Cologuard.  2. Coronary artery disease involving native coronary artery of native heart without angina pectoris All risk factors treated I will ask Dr. Caryn Section to consider seeing this patient in my absence 3. Type 2 diabetes mellitus with other specified complication, without long-term current use of insulin (HCC) A1C is 6.5. If patient experiences any  hypoglycemia at all going forward we will stop the glipizide.  He wishes to continue present meds as they are. - glipiZIDE (GLUCOTROL) 5 MG tablet; Take 1 tablet (5 mg total) by mouth daily before breakfast.  Dispense: 90 tablet; Refill: 3 - POCT glycosylated hemoglobin (Hb A1C) - Glucose Blood (BLOOD GLUCOSE TEST STRIPS) STRP; Check blood sugars once daily.  Dispense: 100 strip; Refill: 11  4. Screening for colon cancer Patient willing to do Cologuard- Cologuard  5. Need for influenza vaccination  - Flu Vaccine QUAD 75moIM (Fluarix, Fluzone & Alfiuria Quad PF)  6. Need for Tdap vaccination  - Tdap vaccine greater than or equal to 7yo IM  7. Need for pneumococcal vaccine  - Pneumococcal conjugate vaccine 20-valent (Prevnar 20)  8. Bipolar disorder, in full remission, most recent episode depressed ([place] Stable.  If he becomes unstable will need psychiatry.  9. Essential (primary) hypertension   10. Hypothyroidism, unspecified type   11. Eczema, unspecified type By dermatology.  This is mainly on the lower legs.   No follow-ups on file.     I, [name], CMA, have reviewed all documentation for this visit. The documentation on 11/30/21 for the exam, diagnosis, procedures, and orders are all accurate and complete.    Adalia Pettis GCranford Mon MD  [phone](phone) [phone](fax)  [place]

## 2021-11-30 LAB — POCT GLYCOSYLATED HEMOGLOBIN (HGB A1C)
Est. average glucose Bld gHb Est-mCnc: 140
Hemoglobin A1C: 6.5 % — AB (ref 4.0–5.6)

## 2021-12-08 ENCOUNTER — Other Ambulatory Visit: Payer: Self-pay | Admitting: Family Medicine

## 2021-12-08 NOTE — Telephone Encounter (Signed)
Refilled 08/26/2021 #90 1 rf - confirmed by same pharmacy. Requested Prescriptions  Pending Prescriptions Disp Refills  . lamoTRIgine (LAMICTAL) 200 MG tablet [Pharmacy Med Name: LAMOTRIGINE 200 MG TABLET] 90 tablet 1    Sig: TAKE 1 TABLET BY MOUTH EVERY DAY     Neurology:  Anticonvulsants - lamotrigine Passed - 12/08/2021 10:50 AM      Passed - Cr in normal range and within 360 days    Creat  Date Value Ref Range Status  01/05/2017 0.93 0.70 - 1.33 mg/dL Final    Comment:    For patients >32 years of age, the reference limit for Creatinine is approximately 13% higher for people identified as African-American. .    Creatinine, Ser  Date Value Ref Range Status  06/18/2021 1.02 0.76 - 1.27 mg/dL Final         Passed - ALT in normal range and within 360 days    ALT  Date Value Ref Range Status  06/18/2021 14 0 - 44 IU/L Final         Passed - AST in normal range and within 360 days    AST  Date Value Ref Range Status  06/18/2021 16 0 - 40 IU/L Final         Passed - Completed PHQ-2 or PHQ-9 in the last 360 days      Passed - Valid encounter within last 12 months    Recent Outpatient Visits          1 week ago Annual physical exam   [facility] [name], MD   6 months ago Essential hypertension   [facility] [name], MD   11 months ago Type 2 diabetes mellitus with other specified complication, without long-term current use of insulin [facility])   [facility] [name], MD   1 year ago Type 2 diabetes mellitus with other specified complication, without long-term current use of insulin [facility])   [facility] [name], MD   1 year ago Type 2 diabetes mellitus with other specified complication, without long-term current use of insulin [facility])   [facility] [name], MD

## 2022-01-08 ENCOUNTER — Other Ambulatory Visit: Payer: Self-pay | Admitting: Family Medicine

## 2022-01-08 DIAGNOSIS — E1169 Type 2 diabetes mellitus with other specified complication: Secondary | ICD-10-CM

## 2022-01-08 NOTE — Telephone Encounter (Signed)
Requested medication (s) are due for refill today: yes  Requested medication (s) are on the active medication list: yes  Last refill:  10/13/21  Future visit scheduled: no  Notes to clinic:  Unable to refill per protocol, last refill by provider no longer at practice, routing for approval.     Requested Prescriptions  Pending Prescriptions Disp Refills   pioglitazone-metformin (ACTOPLUS MET) 15-850 MG tablet [Pharmacy Med Name: Pioglitazone Hydrochloride and Metformin Hydrochloride 18m-850mg Tablet] 180 tablet 0    Sig: Take 1 tablet by mouth twice daily.     Endocrinology:  Diabetes - Biguanide + Pioglitazone Combo Failed - 01/08/2022 12:59 PM      Failed - B12 Level in normal range and within 720 days    No results found for: "VITAMINB12"       Passed - Cr in normal range and within 360 days    Creat  Date Value Ref Range Status  01/05/2017 0.93 0.70 - 1.33 mg/dL Final    Comment:    For patients >460years of age, the reference limit for Creatinine is approximately 13% higher for people identified as African-American. .    Creatinine, Ser  Date Value Ref Range Status  06/18/2021 1.02 0.76 - 1.27 mg/dL Final         Passed - HBA1C is between 0 and 7.9 and within 180 days    Hemoglobin A1C  Date Value Ref Range Status  11/30/2021 6.5 (A) 4.0 - 5.6 % Final   Hgb A1c MFr Bld  Date Value Ref Range Status  06/18/2021 7.2 (H) 4.8 - 5.6 % Final    Comment:             Prediabetes: 5.7 - 6.4          Diabetes: >6.4          Glycemic control for adults with diabetes: <7.0          Passed - eGFR in normal range and within 360 days    GFR, Est African American  Date Value Ref Range Status  01/05/2017 105 > OR = 60 mL/min/1.743mFinal   GFR calc Af Amer  Date Value Ref Range Status  03/13/2020 90 >59 mL/min/1.73 Final    Comment:    **In accordance with recommendations from the NKF-ASN Task force,**   [facility] is in the process of updating its eGFR calculation to  the   2021 CKD-EPI creatinine equation that estimates kidney function   without a race variable.    GFR, Est Non African American  Date Value Ref Range Status  01/05/2017 90 > OR = 60 mL/min/1.7312minal   GFR calc non Af Amer  Date Value Ref Range Status  03/13/2020 78 >59 mL/min/1.73 Final   eGFR  Date Value Ref Range Status  06/18/2021 83 >59 mL/min/1.73 Final         Passed - Valid encounter within last 6 months    Recent Outpatient Visits           1 month ago [facility]   7 months [facility]   12 months ago Type 2 diabetes mellitus with other specified complication, without long-term current use of insulin [facility]   1 year ago Type 2 diabetes mellitus with other specified complication, without long-term current use of  insulin [facility])   [facility] [name], MD   1 year ago Type 2 diabetes mellitus with other specified complication, without long-term current use of insulin [facility])   [name] MD              Passed - CBC within normal limits and completed in the last 12 months    WBC  Date Value Ref Range Status  06/18/2021 7.3 3.4 - 10.8 x10E3/uL Final  10/06/2011 8.6 4.0 - 10.5 K/uL Final   RBC  Date Value Ref Range Status  06/18/2021 4.56 4.14 - 5.80 x10E6/uL Final  10/06/2011 4.11 (L) 4.22 - 5.81 MIL/uL Final   Hemoglobin  Date Value Ref Range Status  06/18/2021 14.9 13.0 - 17.7 g/dL Final   Hematocrit  Date Value Ref Range Status  06/18/2021 44.0 37.5 - 51.0 % Final   MCHC  Date Value Ref Range Status  06/18/2021 33.9 31.5 - 35.7 g/dL Final  10/06/2011 36.0 30.0 - 36.0 g/dL Final   [facility]  Date Value Ref Range Status  06/18/2021 32.7 26.6 - 33.0 pg Final  10/06/2011 33.1 26.0 - 34.0 pg Final    MCV  Date Value Ref Range Status  06/18/2021 97 79 - 97 fL Final   No results found for: "PLTCOUNTKUC", "LABPLAT", "POCPLA" RDW  Date Value Ref Range Status  06/18/2021 12.1 11.6 - 15.4 % Final

## 2022-01-13 ENCOUNTER — Other Ambulatory Visit: Payer: Self-pay | Admitting: Cardiology

## 2022-01-31 ENCOUNTER — Other Ambulatory Visit: Payer: Self-pay | Admitting: Cardiology

## 2022-02-04 NOTE — Progress Notes (Signed)
I,Roshena L Chambers,acting as a scribe for Lelon Huh, MD.,have documented all relevant documentation on the behalf of Lelon Huh, MD,as directed by  Lelon Huh, MD while in the presence of Lelon Huh, MD.    Established patient visit   Patient: [name]   DOB: [birth year]   61 y.o. Male  MRN: [id number] Visit Date: 02/05/2022  Today's healthcare provider: Lelon Huh, MD   Chief Complaint  Patient presents with   Insomnia   Subjective    HPI  Follow up for insomnia:  The patient was last seen for this more than 1 year ago.   Current treatment includes Temazepam 62m at bedtime.  He reports good compliance with treatment. He feels that condition is Unchanged. He is not having side effects.   He has been taking this for many years and doing well. Is also due for refill of lamotrigine for anxiety and depression. Was originally prescribed both of these medications by Dr. KNicolasa Ducking but had been doing very well for several years so is having refilled here and no longer seeing Dr. KNicolasa Ducking-----------------------------------------------------------------------------------------   Medications: Outpatient Medications Prior to Visit  Medication Sig   aspirin 81 MG tablet Take by mouth.   atorvastatin (LIPITOR) 80 MG tablet Take 1 tablet by mouth daily.  **Please attend upcoming office visit with labs scheduled for 05/20/21 for further refills.**   glipiZIDE (GLUCOTROL) 5 MG tablet Take 1 tablet (5 mg total) by mouth daily before breakfast.   Glucose Blood (BLOOD GLUCOSE TEST STRIPS) STRP Check blood sugars once daily.   lamoTRIgine (LAMICTAL) 200 MG tablet TAKE 1 TABLET BY MOUTH EVERY DAY   levothyroxine (SYNTHROID) 150 MCG tablet Take 1 tablet (150 mcg total) by mouth daily before breakfast.   naproxen (NAPROSYN) 500 MG tablet TAKE 1 TABLET BY MOUTH TWICE A DAY AS NEEDED   nitroGLYCERIN (NITROSTAT) 0.4 MG SL tablet Place 1 tablet under the tongue every 5 minutes for 3  doses as needed for chest pain. **Please contact your provider's office for further refills ([phone].**   pioglitazone-metformin (ACTOPLUS MET) 15-850 MG tablet Take 1 tablet by mouth 2 (two) times daily.   ramipril (ALTACE) 5 MG capsule Take 1 capsule (5 mg total) by mouth daily.   sotalol (BETAPACE) 120 MG tablet Take 1 tablet (120 mg total) by mouth 2 (two) times daily. Schedule an appointment with Dr.Jordan for future refills. 1st attempt   SOTALOL AF 120 MG TABS Take 1 tablet (120 mg total) by mouth 2 (two) times daily. Please contact the office to schedule appointment fir additional refills.   temazepam (RESTORIL) 30 MG capsule TAKE 1 CAPSULE BY MOUTH AT BEDTIME AS NEEDED FOR SLEEP   triamcinolone cream (KENALOG) 0.1 % Apply 1 application topically 2 (two) times daily as needed.   No facility-administered medications prior to visit.    Review of Systems  Constitutional:  Negative for appetite change, chills and fever.  Respiratory:  Negative for chest tightness, shortness of breath and wheezing.   Cardiovascular:  Negative for chest pain and palpitations.  Gastrointestinal:  Negative for abdominal pain, nausea and vomiting.       Objective    BP 138/77 (BP Location: Right Arm, Patient Position: Sitting, Cuff Size: Large)   Pulse (!) 58   Temp 97.9 F (36.6 C) (Oral)   Resp 14   Wt 208 lb (94.3 kg)   SpO2 98% Comment: room air  BMI 29.84 kg/m    Physical Exam  General  appearance: Well developed, well nourished male, cooperative and in no acute distress Head: Normocephalic, without obvious abnormality, atraumatic Respiratory: Respirations even and unlabored, normal respiratory rate Extremities: All extremities are intact.  Skin: Skin color, texture, turgor normal. No rashes seen  Psych: Appropriate mood and affect. Neurologic: Mental status: Alert, oriented to person, place, and time, thought content appropriate.    Assessment & Plan     1. Anxiety,  generalized  2. Depression, major, recurrent, mild ([place]) Doing very well on current dose of lamotrigine originally prescribed by Dr. Nicolasa Ducking - lamoTRIgine (LAMICTAL) 200 MG tablet; Take 1 tablet (200 mg total) by mouth daily.  Dispense: 90 tablet; Refill: 3  3. Insomnia, unspecified type Has been taking 32m hs for years. Will try cutting back to temazepam (RESTORIL) 15 MG capsule; Take 1-2 capsules (15-30 mg total) by mouth at bedtime as needed for sleep.  Dispense: 60 capsule; Refill: 4      The entirety of the information documented in the History of Present Illness, Review of Systems and Physical Exam were personally obtained by me. Portions of this information were initially documented by the CMA and reviewed by me for thoroughness and accuracy.     DLelon Huh MD  [phone](phone) [phone](fax)  [place]

## 2022-02-05 ENCOUNTER — Ambulatory Visit: Payer: BC Managed Care – PPO | Admitting: Family Medicine

## 2022-02-05 ENCOUNTER — Encounter: Payer: Self-pay | Admitting: Family Medicine

## 2022-02-05 ENCOUNTER — Ambulatory Visit (INDEPENDENT_AMBULATORY_CARE_PROVIDER_SITE_OTHER): Payer: BC Managed Care – PPO | Admitting: Family Medicine

## 2022-02-05 VITALS — BP 138/77 | HR 58 | Temp 97.9°F | Resp 14 | Wt 208.0 lb

## 2022-02-05 DIAGNOSIS — F330 Major depressive disorder, recurrent, mild: Secondary | ICD-10-CM | POA: Diagnosis not present

## 2022-02-05 DIAGNOSIS — G4700 Insomnia, unspecified: Secondary | ICD-10-CM

## 2022-02-05 DIAGNOSIS — F411 Generalized anxiety disorder: Secondary | ICD-10-CM

## 2022-02-05 MED ORDER — LAMOTRIGINE 200 MG PO TABS: 200.0000 mg | ORAL_TABLET | Freq: Every day | ORAL | 3 refills | Status: AC

## 2022-02-05 MED ORDER — TEMAZEPAM 15 MG PO CAPS: 15.0000 mg | ORAL_CAPSULE | Freq: Every evening | ORAL | 4 refills | Status: DC | PRN

## 2022-02-07 ENCOUNTER — Other Ambulatory Visit: Payer: Self-pay | Admitting: Family Medicine

## 2022-02-07 DIAGNOSIS — E1169 Type 2 diabetes mellitus with other specified complication: Secondary | ICD-10-CM

## 2022-03-29 ENCOUNTER — Telehealth: Payer: Self-pay | Admitting: Family Medicine

## 2022-03-29 MED ORDER — LEVOTHYROXINE SODIUM 150 MCG PO TABS: 150.0000 ug | ORAL_TABLET | Freq: Every day | ORAL | 1 refills | Status: AC

## 2022-03-29 NOTE — Telephone Encounter (Signed)
[facility] faxed refill request for the following medications:   levothyroxine (SYNTHROID) 150 MCG tablet    Please advise

## 2022-04-23 ENCOUNTER — Other Ambulatory Visit: Payer: Self-pay

## 2022-04-23 ENCOUNTER — Encounter: Payer: Self-pay | Admitting: Emergency Medicine

## 2022-04-23 ENCOUNTER — Emergency Department
Admission: EM | Admit: 2022-04-23 | Discharge: 2022-04-23 | Disposition: A | Discharge status: Home / Self Care | Payer: BC Managed Care – PPO | Attending: Emergency Medicine | Admitting: Emergency Medicine

## 2022-04-23 ENCOUNTER — Emergency Department: Payer: BC Managed Care – PPO

## 2022-04-23 DIAGNOSIS — I2510 Atherosclerotic heart disease of native coronary artery without angina pectoris: Secondary | ICD-10-CM | POA: Insufficient documentation

## 2022-04-23 DIAGNOSIS — I10 Essential (primary) hypertension: Secondary | ICD-10-CM | POA: Diagnosis not present

## 2022-04-23 DIAGNOSIS — R0789 Other chest pain: Secondary | ICD-10-CM | POA: Insufficient documentation

## 2022-04-23 DIAGNOSIS — E119 Type 2 diabetes mellitus without complications: Secondary | ICD-10-CM | POA: Diagnosis not present

## 2022-04-23 DIAGNOSIS — R079 Chest pain, unspecified: Secondary | ICD-10-CM

## 2022-04-23 LAB — BASIC METABOLIC PANEL
Anion gap: 8 (ref 5–15)
BUN: 16 mg/dL (ref 8–23)
CO2: 30 mmol/L (ref 22–32)
Calcium: 9 mg/dL (ref 8.9–10.3)
Chloride: 103 mmol/L (ref 98–111)
Creatinine, Ser: 1.03 mg/dL (ref 0.61–1.24)
GFR, Estimated: >60 mL/min (ref >60)
Glucose, Bld: 170 mg/dL — ABNORMAL HIGH (ref 70–99)
Potassium: 4.9 mmol/L (ref 3.5–5.1)
Sodium: 141 mmol/L (ref 135–145)

## 2022-04-23 LAB — CBC
HCT: 43.6 % (ref 39.0–52.0)
Hemoglobin: 14.6 g/dL (ref 13.0–17.0)
MCH: 32.4 pg (ref 26.0–34.0)
MCHC: 33.5 g/dL (ref 30.0–36.0)
MCV: 96.9 fL (ref 80.0–100.0)
Platelets: 234 10*3/uL (ref 150–400)
RBC: 4.5 MIL/uL (ref 4.22–5.81)
RDW: 13.8 % (ref 11.5–15.5)
WBC: 7 10*3/uL (ref 4.0–10.5)
nRBC: 0 % (ref 0.0–0.2)

## 2022-04-23 LAB — TROPONIN I (HIGH SENSITIVITY)
Troponin I (High Sensitivity): 6 ng/L (ref <18)
Troponin I (High Sensitivity): 7 ng/L (ref <18)

## 2022-04-23 MED ORDER — SUCRALFATE 1 G PO TABS
1.0000 g | ORAL_TABLET | Freq: Once | ORAL | Status: AC
  Administered 2022-04-23: 1 g via ORAL
  Filled 2022-04-23: qty 1

## 2022-04-23 MED ORDER — FAMOTIDINE 20 MG PO TABS
40.0000 mg | ORAL_TABLET | Freq: Once | ORAL | Status: AC
  Administered 2022-04-23: 40 mg via ORAL
  Filled 2022-04-23: qty 2

## 2022-04-23 MED ORDER — FAMOTIDINE 20 MG PO TABS: 20.0000 mg | ORAL_TABLET | Freq: Two times a day (BID) | ORAL | 0 refills | Status: DC

## 2022-04-23 MED ORDER — METOCLOPRAMIDE HCL 10 MG PO TABS
10.0000 mg | ORAL_TABLET | Freq: Once | ORAL | Status: AC
  Administered 2022-04-23: 10 mg via ORAL
  Filled 2022-04-23: qty 1

## 2022-04-23 NOTE — ED Notes (Signed)
Discharge instructions given to pt and significant other. Pt and significant other voiced understanding. Pt unable to sign due to sign pad not working.

## 2022-04-23 NOTE — ED Provider Notes (Signed)
[facility] Provider Note    Event Date/Time   First MD Initiated Contact with Patient 04/23/22 1209     (approximate)   History   Chief Complaint: Chest Pain   HPI  [name] is a 62 y.o. male with a history of CAD, diabetes, hypertension, hyperlipidemia who comes ED complaining of left upper chest pain described as pressure that started last night, intermittent lasting up to 5 minutes at a time, associated with lightheadedness.  No aggravating or alleviating factors.  Nonradiating, no shortness of breath diaphoresis or vomiting.  Not exertional, not pleuritic.  Most recent episode of discomfort was about 11:00 AM today.  He reports having symptoms such as this off-and-on for the past 3 weeks.  Reports seeing his cardiologist, Dr. Peter [name] about a week ago, who increased his ramipril dosing.     Physical Exam   Triage Vital Signs: ED Triage Vitals  Enc Vitals Group     BP 04/23/22 1155 (!) 189/94     Pulse Rate 04/23/22 1153 63     Resp 04/23/22 1153 18     Temp 04/23/22 1153 98.5 F (36.9 C)     Temp Source 04/23/22 1153 Oral     SpO2 04/23/22 1153 96 %     Weight --      Height --      Head Circumference --      Peak Flow --      Pain Score 04/23/22 1152 0     Pain Loc --      Pain Edu? --      Excl. in [place]? --     Most recent vital signs: Vitals:   04/23/22 1153 04/23/22 1155  BP:  (!) 189/94  Pulse: 63   Resp: 18   Temp: 98.5 F (36.9 C)   SpO2: 96%     General: Awake, no distress.  CV:  Good peripheral perfusion.  Regular rate and rhythm.  Normal distal pulses Resp:  Normal effort.  Clear to auscultation bilaterally Abd:  No distention.  Soft nontender Other:  No lower extremity edema or calf tenderness.  Chest wall nontender, pain not reproducible   ED Results / Procedures / Treatments   Labs (all labs ordered are listed, but only abnormal results are displayed) Labs Reviewed  BASIC METABOLIC PANEL - Abnormal;  Notable for the following components:      Result Value   Glucose, Bld 170 (*)    All other components within normal limits  CBC  TROPONIN I (HIGH SENSITIVITY)  TROPONIN I (HIGH SENSITIVITY)     EKG Interpreted by me Sinus rhythm rate of 69.  Left axis, normal intervals.  Normal QRS ST segments and T waves.   RADIOLOGY Chest x-ray interpreted by me, appears normal.  Radiology report reviewed   PROCEDURES:  Procedures   MEDICATIONS ORDERED IN ED: Medications  sucralfate (CARAFATE) tablet 1 g (1 g Oral Given 04/23/22 1253)  metoCLOPramide (REGLAN) tablet 10 mg (10 mg Oral Given 04/23/22 1253)  famotidine (PEPCID) tablet 40 mg (40 mg Oral Given 04/23/22 1253)     IMPRESSION / MDM / ASSESSMENT AND PLAN / ED COURSE  I reviewed the triage vital signs and the nursing notes.  DDx: GERD, non-STEMI, pneumothorax, pleural effusion,  Patient's presentation is most consistent with acute presentation with potential threat to life or bodily function.  Patient presents with intermittent episodes of nonspecific chest pain.  Doubt unstable angina, dissection, PE, pericardial effusion,  carditis, pneumothorax.  Will try GI cocktail, obtain trops.   ----------------------------------------- 2:46 PM on 04/23/2022 ----------------------------------------- No recurrent symptoms in the ED.  Serial troponins are normal.  Will have the patient try continuing Pepcid for now and follow-up with his doctor.  Blood pressure remains elevated about 160/80 in the ED.  Patient has been compliant with his ramipril 5 mg daily.  Discussed potentially increasing this but he would prefer to wait until he sees his doctor.  He can monitor it at home as well with his home blood pressure cuff.      FINAL CLINICAL IMPRESSION(S) / ED DIAGNOSES   Final diagnoses:  Nonspecific chest pain     Rx / DC Orders   ED Discharge Orders          Ordered    famotidine (PEPCID) 20 MG tablet  2 times daily         04/23/22 1446             Note:  This document was prepared using Dragon voice recognition software and may include unintentional dictation errors.   Carrie Mew, MD 04/23/22 [phone]

## 2022-04-23 NOTE — ED Triage Notes (Signed)
Patient to ED via POV for left sided chest pain that started during the night. Pain does not radiate states feels more like pressure than pain. 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 Endorses elevated BP this AM- takes meds at home and took approx 1 hr ago.

## 2022-04-26 ENCOUNTER — Telehealth: Payer: Self-pay | Admitting: Cardiology

## 2022-04-26 MED ORDER — RAMIPRIL 10 MG PO CAPS: 10.0000 mg | ORAL_CAPSULE | Freq: Every day | ORAL | 3 refills | Status: DC

## 2022-04-26 NOTE — Telephone Encounter (Addendum)
Patient stated his BP will go up after walking around. Sitting 145/79, walking 178/88 and 171/80. He went to the ED on Friday for chest tightness 4/10 on the left side of chest. He stated he has these episodes at random, and they can last 5 seconds or more. He was put on Pepcid twice daily. Recommended he use enteric-coated ASA. He has not used NTG. Education on NTG use. His pressure goes up with exertion, no SOB. Please advise on BP.

## 2022-04-26 NOTE — Telephone Encounter (Signed)
Pt c/o BP issue: STAT if pt c/o blurred vision, one-sided weakness or slurred speech  1. What are your last 5 BP readings?   143/78 - sitting down  178/90 - standing up or moving around   2. Are you having any other symptoms (ex. Dizziness, headache, blurred vision, passed out)? No   3. What is your BP issue? Pt was seen in ER this past weekend, but he is still concerned with his BP. Pt wants to know if Dr. [name] wants to change any of his medications.

## 2022-04-26 NOTE — Telephone Encounter (Signed)
Patient advised to increase ramipril to '10mg'$  daily. He will take another '5mg'$  now and start '10mg'$  in the morning. Appointment made with Melvenia Needles for 2/14. Med list updated.

## 2022-04-26 NOTE — Addendum Note (Signed)
Addended by: Betha Loa F on: 04/26/2022 03:02 PM   Modules accepted: Orders

## 2022-04-28 ENCOUNTER — Telehealth: Payer: Self-pay | Admitting: Cardiology

## 2022-04-28 NOTE — Telephone Encounter (Signed)
Pt c/o BP issue: STAT if pt c/o blurred vision, one-sided weakness or slurred speech  1. What are your last 5 BP readings? 186/89    2. Are you having any other symptoms (ex. Dizziness, headache, blurred vision, passed out)? No  3. What is your BP issue? Pt is requesting return call to discuss his bp and to see if his new medication is suppose to already be helping, or if he should wait a while longer.

## 2022-04-28 NOTE — Telephone Encounter (Signed)
Called patient, made aware of response from MD.  Patient verbalized understanding.

## 2022-04-28 NOTE — Telephone Encounter (Signed)
Returned call to patient.  Patient states he has been taking Ramipril '10mg'$  QD as recommended since 04/26/22. He reports SBP 127 (unsure of DBP) yesterday morning. He notes BP increases by 20-30 points when eating or performing any activity. He states he has been checking his BP about every hour.  BP this morning 186/89. Patient rechecked while on the phone with a reading of 177/76, HR 68.  Patient denies any headaches or blurred vision.   Patient states he went to ED last week for chest pain and was told it was likely related to acid reflux for which he was prescribed Pepcid. Patient states he has not noticed any improvement since taking the Pepcid.  Patient states he has not used NTG.  Patient denies increased salt intake.  Informed patient that eating, talking, activity and checking BP frequently can raise the BP. He reports his BP cuff is 56-79 years old and has not been checked against a manual reading for accuracy.  Will forward to Dr. [name] and [facility] Triage to review and address.  Patient has appt with Caron Presume, PA-C on 05/05/22.

## 2022-04-29 ENCOUNTER — Encounter: Payer: Self-pay | Admitting: Physician Assistant

## 2022-04-29 ENCOUNTER — Ambulatory Visit: Payer: BC Managed Care – PPO | Attending: Physician Assistant | Admitting: Physician Assistant

## 2022-04-29 VITALS — BP 144/80 | HR 60 | Ht 70.0 in | Wt 204.0 lb

## 2022-04-29 DIAGNOSIS — I2510 Atherosclerotic heart disease of native coronary artery without angina pectoris: Secondary | ICD-10-CM

## 2022-04-29 DIAGNOSIS — I4729 Other ventricular tachycardia: Secondary | ICD-10-CM | POA: Diagnosis not present

## 2022-04-29 NOTE — Patient Instructions (Signed)
Medication Instructions:  Your physician recommends that you continue on your current medications as directed. Please refer to the Current Medication list given to you today.  *If you need a refill on your cardiac medications before your next appointment, please call your pharmacy*   Lab Work: None ordered If you have labs (blood work) drawn today and your tests are completely normal, you will receive your results only by: [name] (if you have MyChart) OR A paper copy in the mail If you have any lab test that is abnormal or we need to change your treatment, we will call you to review the results.   Testing/Procedures: Your physician has requested that you have an echocardiogram. Echocardiography is a painless test that uses sound waves to create images of your heart. It provides your doctor with information about the size and shape of your heart and how well your heart's chambers and valves are working. This procedure takes approximately one hour. There are no restrictions for this procedure. Please do NOT wear cologne, perfume, aftershave, or lotions (deodorant is allowed). Please arrive 15 minutes prior to your appointment time.    Follow-Up: At [facility], you and your health needs are our priority.  As part of our continuing mission to provide you with exceptional heart care, we have created designated Provider Care Teams.  These Care Teams include your primary Cardiologist (physician) and Advanced Practice Providers (APPs -  Physician Assistants and Nurse Practitioners) who all work together to provide you with the care you need, when you need it.   Your next appointment:   09/27/2022 at 8:20 AM  Provider:   Dr [name]  Other Instructions 1.Omron is the brand of home blood pressure monitor that we discussed today 2.Check your blood pressure daily, one hour after taking your morning medications for 2 weeks, keep a log and send [place] the readings via mychart at the end  of the 2 weeks 3.Limit your caffeine intake

## 2022-04-29 NOTE — Progress Notes (Signed)
Office Visit    Patient Name: [name] Date of Encounter: 04/29/2022  PCP:  Birdie Sons, MD   [facility]  Cardiologist:  Peter [name], MD  Advanced Practice Provider:  No care team member to display Electrophysiologist:  None    Chief Complaint    [name] is a 62 y.o. male with a past medical history of CAD and VT presents today for acute follow-up visit.  He has a history of significant coronary artery disease.  First myocardial infarction at age 39.  At that time he had stenting of the proximal right coronary with 4.0 x 13 mm MultiLink stent and distal vessel with a 3.0 x 8 mm MultiLink stent.  June 2010 he again had an acute infarct related to occlusion of the distal right coronary.  The distal vessel was stented with a 2.5 x 24 mm Taxus stent and a 2.75 x 20 mm Taxus stent at the crux.  He had follow-up cardiac catheterization September 2010 which demonstrated nonobstructive disease and ejection fraction of 55%.  His last evaluation with cardiac catheterization July 2013 showed nonobstructive disease as well.  He also has history of symptomatic monomorphic ventricular tachycardia that have been treated with Betapace therapy, well-controlled.  He was last seen April 2023 by Dr. [name] at that time he was doing well from a cardiac standpoint.  He was retired.  He reported vague chest discomfort at this time.  Tolerating medications well.  Blood pressure 130s over 70s at home.  A1c had gone up to 8.7%.  He went on glipizide and A1c came down to 7.2%.  Today, he explains that he was recently in the hospital 04/23/2022.  He had some muscular chest pain on the left side of his chest which was like a dull ache.  He also became flushed.  He states it does not last long maybe 2 or 3 seconds.  Mostly on his chest wall.  He has also had some headaches for which she takes Tylenol and this helps.  Troponin was negative in the hospital and EKG was unremarkable.   He was started on reflux medicine.  He also thinks anxiety could be contributing.  Right before he came to the office his blood pressure was 133/84 however, our cuff showed 172/92.  We discussed getting a new blood pressure cuff, [employer] brand.  When I returned his blood pressure it came down to 144/80.  His ACE inhibitor was recently increased and is still taking effect.  We have asked him to monitor his blood pressure closely for [place].  I do not think an ischemic workup is indicated at this current moment.  Reports no shortness of breath nor dyspnea on exertion. No edema, orthopnea, PND. Reports no palpitations.     Past Medical History    Past Medical History:  Diagnosis Date   Arthritis    Coronary artery disease    Diabetes mellitus    Dyslipidemia    Gout    History of tobacco use    Hypertension    Hypothyroidism    Old inferior myocardial infarction    Parathyroid adenoma    Sleep apnea    does not use cpap   Thyroid disease    Ventricular tachycardia ([place])    Symptomatic ventricular tachycardia   Past Surgical History:  Procedure Laterality Date   CARDIAC CATHETERIZATION  11/2008   nonobstructive CAD, left main normal, LAD normal, minor LCx irregularities <10%, mRCA 30%  ISR, dRCA 30% narrowing proximal to PDA and PLB bifurcation; stented segments widely patent, LVEF 55%; severe inferior basal wall hypokinesis   CARDIAC CATHETERIZATION  08/2004   Acute inferior MI 2/2 dRCA occlusion s/p 2.5 x 24 mm Taxus stent and a 2.75 x 20 mm Taxus stent at the crux; LVEF 45%, moderate akinesis of the inferior wall   CARDIAC CATHETERIZATION  2000   pRCA 4.0 x 13 mm MultiLink stent, dRCA 3.0 x 8 mm MultiLink stent   CARDIAC CATHETERIZATION  10/06/11   normal left main, LAD, D1-3, LCx and OM1-OM3; 20% prox RCA stenosis, 40-50% ISR mid RCA, 20% ISR distal RCA; LVEF 45-50%; mild basal inferior wall hypokinesis.   CHOLECYSTECTOMY     LEFT HEART CATHETERIZATION WITH CORONARY ANGIOGRAM N/A  10/06/2011   Procedure: LEFT HEART CATHETERIZATION WITH CORONARY ANGIOGRAM;  Surgeon: Wellington Hampshire, MD;  Location: [facility] CATH LAB;  Service: Cardiovascular;  Laterality: N/A;   OTHER SURGICAL HISTORY     Status post removal of parathyroid adenoma   parathroid      Allergies  Allergies  Allergen Reactions   Crestor [Rosuvastatin Calcium]     ITCHING AND PALPITATIONS   Penicillins     As a child; took as adult and no reaction.    EKGs/Labs/Other Studies Reviewed:   The following studies were reviewed today: No recent studies  EKG:  EKG is not ordered today.    Recent Labs: 06/18/2021: ALT 14; TSH 1.790 04/23/2022: BUN 16; Creatinine, Ser 1.03; Hemoglobin 14.6; Platelets 234; Potassium 4.9; Sodium 141  Recent Lipid Panel    Component Value Date/Time   CHOL 129 06/18/2021 0822   TRIG 121 06/18/2021 0822   HDL 30 (L) 06/18/2021 0822   CHOLHDL 3.5 03/13/2020 1027   CHOLHDL 3.9 01/05/2017 0936   VLDL 30 10/05/2011 0933   LDLCALC 77 06/18/2021 0822   LDLCALC 67 01/05/2017 0936    Home Medications   Current Meds  Medication Sig   aspirin 81 MG tablet Take by mouth.   atorvastatin (LIPITOR) 80 MG tablet Take 1 tablet by mouth daily.  **Please attend upcoming office visit with labs scheduled for 05/20/21 for further refills.**   famotidine (PEPCID) 20 MG tablet Take 1 tablet (20 mg total) by mouth 2 (two) times daily.   glipiZIDE (GLUCOTROL) 5 MG tablet Take 1 tablet (5 mg total) by mouth daily before breakfast.   Glucose Blood (BLOOD GLUCOSE TEST STRIPS) STRP Check blood sugars once daily.   lamoTRIgine (LAMICTAL) 200 MG tablet Take 1 tablet (200 mg total) by mouth daily.   levothyroxine (SYNTHROID) 150 MCG tablet Take 1 tablet (150 mcg total) by mouth daily before breakfast.   nitroGLYCERIN (NITROSTAT) 0.4 MG SL tablet Place 1 tablet under the tongue every 5 minutes for 3 doses as needed for chest pain. **Please contact your provider's office for further refills [phone]).**    pioglitazone-metformin (ACTOPLUS MET) 15-850 MG tablet Take 1 tablet by mouth twice daily. Appointment needed.   ramipril (ALTACE) 10 MG capsule Take 1 capsule (10 mg total) by mouth daily.   sotalol (BETAPACE) 120 MG tablet Take 1 tablet (120 mg total) by mouth 2 (two) times daily. Schedule an appointment with Dr.Jordan for future refills. 1st attempt   SOTALOL AF 120 MG TABS Take 1 tablet (120 mg total) by mouth 2 (two) times daily. Please contact the office to schedule appointment fir additional refills.   temazepam (RESTORIL) 15 MG capsule Take 1-2 capsules (15-30 mg total) by mouth at  bedtime as needed for sleep.   triamcinolone cream (KENALOG) 0.1 % Apply 1 application topically 2 (two) times daily as needed.     Review of Systems      All other systems reviewed and are otherwise negative except as noted above.  Physical Exam    VS:  BP (!) 144/80   Pulse 60   Ht '5\' 10"'$  (1.778 m)   Wt 204 lb (92.5 kg)   SpO2 99%   BMI 29.27 kg/m  , BMI Body mass index is 29.27 kg/m.  Wt Readings from Last 3 Encounters:  04/29/22 204 lb (92.5 kg)  02/05/22 208 lb (94.3 kg)  11/25/21 209 lb (94.8 kg)     GEN: Well nourished, well developed, in no acute distress. HEENT: normal. Neck: Supple, no JVD, carotid bruits, or masses. Cardiac: RRR, no murmurs, rubs, or gallops. No clubbing, cyanosis, edema.  Radials/PT 2+ and equal bilaterally.  Respiratory:  Respirations regular and unlabored, clear to auscultation bilaterally. GI: Soft, nontender, nondistended. MS: No deformity or atrophy. Skin: Warm and dry, no rash. Neuro:  Strength and sensation are intact. Psych: Normal affect.  Assessment & Plan    CAD -his anginal equivalent was chest pain with radiation to the neck and down his left arm, diaphoresis -more MS picture today -ischemic workup deferred at this time -we will order an echo for peace of mind and since he has never had one done  2. Hypertension -elevated in the office  today but did come down to 144/80 after sitting for a while -encouraged for him to monitor BP at home with new BP cuff and send me those readings -encouraged a low sodium diet and increased exercise regimen Limit caffeine  3. Hyperlipidemia -LDL 77, HDL 30, triglycerides 121 -continue Lipitor '80mg'$  daily  4. Hypothyroidism -plan for repeat TSH when he sees his PCP next month  HYPERTENSION CONTROL Vitals:   04/29/22 1529 04/29/22 1705  BP: (!) 172/92 (!) 144/80    The patient's blood pressure is elevated above target today.  In order to address the patient's elevated BP: Blood pressure will be monitored at home to determine if medication changes need to be made.         Disposition: Follow up 6 months with Peter [name], MD or APP.  Signed, Elgie Collard, PA-C 04/29/2022, 5:06 PM [facility]

## 2022-05-04 ENCOUNTER — Telehealth: Payer: Self-pay | Admitting: Cardiology

## 2022-05-04 ENCOUNTER — Other Ambulatory Visit: Payer: Self-pay | Admitting: Cardiology

## 2022-05-04 NOTE — Telephone Encounter (Signed)
That is fine with me.  Nelva Bush, MD [facility]

## 2022-05-04 NOTE — Telephone Encounter (Signed)
New message   Patient was seen on 04-29-22 at the [address] office.  Patient request to transfer care to the [facility] location (Dr End) because it is closer.  Is this ok with both providers?  If ok, I will reschedule their July appt to the [place] location.

## 2022-05-05 ENCOUNTER — Ambulatory Visit: Payer: BC Managed Care – PPO | Admitting: Physician Assistant

## 2022-05-13 ENCOUNTER — Telehealth: Payer: Self-pay | Admitting: Internal Medicine

## 2022-05-13 NOTE — Telephone Encounter (Signed)
LVM to schedule  End, Harrell Gave, MD  P Cv [facility] Scheduling Good morning,  I was contacted by [name]'s psychiatrist about severely elevated BP's.  Looks like [name] was previously followed by Dr. [name] at [facility] but is in the process of transferring his care to [facility].  Could you try to get him in to see me or an APP in the next week or two to reassess his BP?  Thanks.  Nelva Bush, MD [facility]

## 2022-05-14 ENCOUNTER — Encounter: Payer: Self-pay | Admitting: Cardiology

## 2022-05-14 ENCOUNTER — Ambulatory Visit: Payer: BC Managed Care – PPO | Attending: Cardiology | Admitting: Cardiology

## 2022-05-14 VITALS — BP 152/88 | HR 68 | Ht 70.0 in | Wt 202.6 lb

## 2022-05-14 DIAGNOSIS — I10 Essential (primary) hypertension: Secondary | ICD-10-CM

## 2022-05-14 DIAGNOSIS — I2510 Atherosclerotic heart disease of native coronary artery without angina pectoris: Secondary | ICD-10-CM | POA: Diagnosis not present

## 2022-05-14 DIAGNOSIS — I4729 Other ventricular tachycardia: Secondary | ICD-10-CM

## 2022-05-14 DIAGNOSIS — E782 Mixed hyperlipidemia: Secondary | ICD-10-CM | POA: Diagnosis not present

## 2022-05-14 DIAGNOSIS — E039 Hypothyroidism, unspecified: Secondary | ICD-10-CM

## 2022-05-14 MED ORDER — RAMIPRIL 10 MG PO CAPS: ORAL_CAPSULE | ORAL | 3 refills | Status: DC

## 2022-05-14 NOTE — Progress Notes (Signed)
Cardiology Clinic Note   Patient Name: [name] Date of Encounter: 05/14/2022  Primary Care Provider:  Eulas Post, MD Primary Cardiologist:  Nelva Bush, MD  Patient Profile    62 year old male with a past medical history of coronary artery disease, monomorphic ventricular tachycardia, essential hypertension, hyperlipidemia, type 2 diabetes, hypothyroidism, hyperparathyroidism, and anxiety who is here today to follow-up on his hypertension.  Past Medical History    Past Medical History:  Diagnosis Date   Arthritis    Coronary artery disease    Diabetes mellitus    Dyslipidemia    Gout    History of tobacco use    Hypertension    Hypothyroidism    Old inferior myocardial infarction    Parathyroid adenoma    Sleep apnea    does not use cpap   Thyroid disease    Ventricular tachycardia ([place])    Symptomatic ventricular tachycardia   Past Surgical History:  Procedure Laterality Date   CARDIAC CATHETERIZATION  11/2008   nonobstructive CAD, left main normal, LAD normal, minor LCx irregularities <10%, mRCA 30% ISR, dRCA 30% narrowing proximal to PDA and PLB bifurcation; stented segments widely patent, LVEF 55%; severe inferior basal wall hypokinesis   CARDIAC CATHETERIZATION  08/2004   Acute inferior MI 2/2 dRCA occlusion s/p 2.5 x 24 mm Taxus stent and a 2.75 x 20 mm Taxus stent at the crux; LVEF 45%, moderate akinesis of the inferior wall   CARDIAC CATHETERIZATION  2000   pRCA 4.0 x 13 mm MultiLink stent, dRCA 3.0 x 8 mm MultiLink stent   CARDIAC CATHETERIZATION  10/06/11   normal left main, LAD, D1-3, LCx and OM1-OM3; 20% prox RCA stenosis, 40-50% ISR mid RCA, 20% ISR distal RCA; LVEF 45-50%; mild basal inferior wall hypokinesis.   CHOLECYSTECTOMY     LEFT HEART CATHETERIZATION WITH CORONARY ANGIOGRAM N/A 10/06/2011   Procedure: LEFT HEART CATHETERIZATION WITH CORONARY ANGIOGRAM;  Surgeon: Wellington Hampshire, MD;  Location: [facility] CATH LAB;  Service:  Cardiovascular;  Laterality: N/A;   OTHER SURGICAL HISTORY     Status post removal of parathyroid adenoma   parathroid      Allergies  Allergies  Allergen Reactions   Crestor [Rosuvastatin Calcium]     ITCHING AND PALPITATIONS   Penicillins     As a child; took as adult and no reaction.    History of Present Illness    [name] is a 62 year old male with previously mentioned past medical history of coronary artery disease, monomorphic ventricular tachycardia, essential hypertension, hyperlipidemia, type 2 diabetes, hypothyroidism, hyperparathyroidism, and anxiety.  He has a significant past medical history of coronary artery disease with his first myocardial infarction at the age of 34.  At that time he underwent successful PCI/DES of the proximal and distal area of the right coronary.  He had a follow-up left heart catheterization in 11/2008 which demonstrated nonobstructive disease and ejection fraction 55%.  His last evaluation by cardiac catheterization was 09/2011 which showed nonobstructive disease as well.  He had a longstanding history of symptomatic monomorphic ventricular tachycardia and has been treated with Betapace therapy and has been well-controlled at that time.  He was last seen in clinic 04/29/2022 by T. Harriet Pho, PA-C after recent hospitalization 04/23/2022.  He had had some muscular chest pain the left side of his chest that he described as a dull ache.  Blood pressure was slightly elevated but his lisinopril had just recently been increased with there were  no medication changes that were made.  He was encouraged to obtain a new blood pressure cuff due to the heat tracing between his home blood pressure monitoring and in office monitoring as well as to limit his caffeine.  He returns clinic today after having elevated blood pressure at another provider office.  He states that he is unsure whether the elevation in his blood pressure is related to anxiety or if something else  is going on.  He denies any dietary discretions does not use large quantity of salt avoids fast food avoids prepackaged and preprocessed foods.  He was evaluated in the emergency department for some muscular type chest discomfort that he states he still has twinges here and they are that only lasts for a matter of seconds.  He denies any associated shortness of breath dyspnea on exertion, diaphoresis, nausea or vomiting.  He is previously scheduled for an echocardiogram at his last visit.  At that time there were no changes made to his medication regimen as it had just been increased prior to that visit.  He states that typically he can take his blood pressure in the morning and is elevated he will take his medication and it takes roughly 4 to 5 hours before he notices a decrease in his pressure and then by the evenings he may be down to in the 130s to 150s.  Home Medications    Current Outpatient Medications  Medication Sig Dispense Refill   aspirin 81 MG tablet Take 81 mg by mouth daily.     atorvastatin (LIPITOR) 80 MG tablet Take 1 tablet by mouth daily.  **Please attend upcoming office visit with labs scheduled for 05/20/21 for further refills.** 90 tablet 3   famotidine (PEPCID) 20 MG tablet Take 1 tablet (20 mg total) by mouth 2 (two) times daily. 60 tablet 0   glipiZIDE (GLUCOTROL) 5 MG tablet Take 1 tablet (5 mg total) by mouth daily before breakfast. 90 tablet 3   Glucose Blood (BLOOD GLUCOSE TEST STRIPS) STRP Check blood sugars once daily. 100 strip 11   lamoTRIgine (LAMICTAL) 200 MG tablet Take 1 tablet (200 mg total) by mouth daily. 90 tablet 3   levothyroxine (SYNTHROID) 150 MCG tablet Take 1 tablet (150 mcg total) by mouth daily before breakfast. 90 tablet 1   nitroGLYCERIN (NITROSTAT) 0.4 MG SL tablet Place 1 tablet under the tongue every 5 minutes for 3 doses as needed for chest pain. **Please contact your provider's office for further refills [phone]).** 25 tablet 3    pioglitazone-metformin (ACTOPLUS MET) 15-850 MG tablet Take 1 tablet by mouth twice daily. Appointment needed. 180 tablet 0   sotalol (BETAPACE) 120 MG tablet Take 1 tablet (120 mg total) by mouth 2 (two) times daily. Schedule an appointment with Dr.Jordan for future refills. 1st attempt 180 tablet 3   SOTALOL AF 120 MG TABS Take 1 tablet (120 mg total) by mouth 2 (two) times daily. 180 tablet 1   temazepam (RESTORIL) 15 MG capsule Take 1-2 capsules (15-30 mg total) by mouth at bedtime as needed for sleep. 60 capsule 4   naproxen (NAPROSYN) 500 MG tablet TAKE 1 TABLET BY MOUTH TWICE A DAY AS NEEDED (Patient not taking: Reported on 04/29/2022) 60 tablet 0   ramipril (ALTACE) 10 MG capsule Take one tablet (10 mg) in the AM and 1/2 tablet ( 5 mg) by mouth in the PM. 90 capsule 3   triamcinolone cream (KENALOG) 0.1 % Apply 1 application topically 2 (two) times daily  as needed. (Patient not taking: Reported on 05/14/2022) 30 g 0   No current facility-administered medications for this visit.     Family History    Family History  Problem Relation Age of Onset   Heart attack Father    Stroke Father    Alzheimer's disease Father    Diabetes Mother    He indicated that his mother is alive. He indicated that his father is deceased.  Social History    Social History   Socioeconomic History   Marital status: Married    Spouse name: Not on file   Number of children: 3   Years of education: Not on file   Highest education level: Not on file  Occupational History   Occupation: [employer]  Tobacco Use   Smoking status: Former   Smokeless tobacco: Current    Types: Snuff  Vaping Use   Vaping Use: Never used  Substance and Sexual Activity   Alcohol use: No   Drug use: No   Sexual activity: Yes  Other Topics Concern   Not on file  Social History Narrative   Lives in [place], [place] with wife.    Social Determinants of Health   Financial Resource Strain: Not on file  Food Insecurity:  Not on file  Transportation Needs: Not on file  Physical Activity: Not on file  Stress: Not on file  Social Connections: Not on file  Intimate Partner Violence: Not on file     Review of Systems    General:  No chills, fever, night sweats or weight changes.  Cardiovascular: Has chronic twinges of chest pain, dyspnea on exertion, edema, orthopnea, palpitations, paroxysmal nocturnal dyspnea. Dermatological: No rash, lesions/masses Respiratory: No cough, dyspnea Urologic: No hematuria, dysuria Abdominal:   No nausea, vomiting, diarrhea, bright red blood per rectum, melena, or hematemesis Neurologic:  No visual changes, wkns, changes in mental status. All other systems reviewed and are otherwise negative except as noted above.   Physical Exam    VS:  BP (!) 166/92 (BP Location: Left Arm, Patient Position: Sitting, Cuff Size: Normal)   Pulse 68   Ht '5\' 10"'$  (1.778 m)   Wt 202 lb 9.6 oz (91.9 kg)   SpO2 97%   BMI 29.07 kg/m  , BMI Body mass index is 29.07 kg/m.     Vitals:   05/14/22 0855 05/14/22 0905  BP: (!) 166/92 (!) 152/88    GEN: Well nourished, well developed, in no acute distress. HEENT: normal. Glasses on.  Neck: Supple, no JVD, carotid bruits, or masses. Cardiac: RRR, no murmurs, rubs, or gallops. No clubbing, cyanosis, edema.  Radials 2+/PT 2+ and equal bilaterally.  Respiratory:  Respirations regular and unlabored, clear to auscultation bilaterally. GI: Soft, nontender, nondistended, BS + x 4. MS: no deformity or atrophy. Skin: warm and dry, no rash. Neuro:  Strength and sensation are intact. Psych: Normal affect.  Accessory Clinical Findings    ECG personally reviewed by me today-no new tracings were completed today  Lab Results  Component Value Date   WBC 7.0 04/23/2022   HGB 14.6 04/23/2022   HCT 43.6 04/23/2022   MCV 96.9 04/23/2022   PLT 234 04/23/2022   Lab Results  Component Value Date   CREATININE 1.03 04/23/2022   BUN 16 04/23/2022   NA 141  04/23/2022   K 4.9 04/23/2022   CL 103 04/23/2022   CO2 30 04/23/2022   Lab Results  Component Value Date   ALT 14 06/18/2021  AST 16 06/18/2021   ALKPHOS 119 06/18/2021   BILITOT 0.4 06/18/2021   Lab Results  Component Value Date   CHOL 129 06/18/2021   HDL 30 (L) 06/18/2021   LDLCALC 77 06/18/2021   TRIG 121 06/18/2021   CHOLHDL 3.5 03/13/2020    Lab Results  Component Value Date   HGBA1C 6.5 (A) 11/30/2021    Assessment & Plan   1.  Hypertension with continued elevated pressures.  Patient stated that he had 1 reading at home that had a blood pressure of [id number] systolic and he has been concerned for stroke and heart attacks with his blood pressure running this high.  He attributes some of his elevated pressure to anxiety as well.  He had previously had his ramipril increased from 5 to 10 mg.  He states his blood pressures are still running quite elevated and takes an extended period of time before his medication kicks and lowers his blood pressure in the mornings.  Keep him on ramipril 10 mg in the a.m. and add an additional 5 mg in the evening.  He is to continue to keep a blood pressure log at home of his pressures 1 to 2 hours post medication administration.  With increasing his ramipril he has been scheduled for a BMP in 2 weeks to reevaluate electrolytes and kidney function.  2.  Coronary artery disease with stable angina.  Recent workup in the [facility] emergency department revealed no ischemic EKG changes and high-sensitivity troponins were negative x 2.  Patient still states today that he has twinges of leg prickly chest discomfort that lasted for a few seconds and then resolves on its own without any associated symptoms.  At his previous office visits he was scheduled for follow-up echocardiogram that has been ordered and pending.  Any further ischemic workup has been deferred at this time as he has more of a musculoskeletal picture.  He is also being treated for acid reflux since  his ED visit.  Continued on aspirin, statin, and Nitrostat as needed.   3.  History of ventricular tachycardia without any recent recurrence.  He is continued on sotalol 120 mg twice daily.  4.  Hyperlipidemia with LDL of 77.  He is continued on atorvastatin 80 mg daily.  5.  Hypothyroidism with blood work of TSH scheduled for next month.  He is also continued on levothyroxine 150 mcg daily.  This continues to be managed by his PCP.  6.  Disposition patient return to clinic to see APP in 2 weeks to reevaluate changes made to blood pressure medication today and blood work that was ordered today to reevaluate kidney and electrolytes.  Arhan Mcmanamon, NP 05/14/2022, 12:37 PM

## 2022-05-14 NOTE — Patient Instructions (Signed)
Medication Instructions:    Ramipril - take one tablet ( 10 mg) in the AM and 1/2 tablet ( 5 mg) in the PM  *If you need a refill on your cardiac medications before your next appointment, please call your pharmacy*   Lab Work:  Your physician recommends that you return for lab work in: 1 week at the medical mall. You will need to be fasting.  No appt is needed. Hours are M-F 7AM- 6 PM.  If you have labs (blood work) drawn today and your tests are completely normal, you will receive your results only by: [facility] (if you have MyChart) OR A paper copy in the mail If you have any lab test that is abnormal or we need to change your treatment, we will call you to review the results.   Testing/Procedures:  None Ordered   Follow-Up: At [facility], you and your health needs are our priority.  As part of our continuing mission to provide you with exceptional heart care, we have created designated Provider Care Teams.  These Care Teams include your primary Cardiologist (physician) and Advanced Practice Providers (APPs -  Physician Assistants and Nurse Practitioners) who all work together to provide you with the care you need, when you need it.  We recommend signing up for the patient portal called "MyChart".  Sign up information is provided on this After Visit Summary.  MyChart is used to connect with patients for Virtual Visits (Telemedicine).  Patients are able to view lab/test results, encounter notes, upcoming appointments, etc.  Non-urgent messages can be sent to your provider as well.   To learn more about what you can do with MyChart, go to [email or web address].    Your next appointment:   2 week(s)  Provider:   You may see Nelva Bush, MD or one of the following Advanced Practice Providers on your designated Care Team:   Murray Hodgkins, NP Christell Faith, PA-C Cadence Kathlen Mody, PA-C Gerrie Nordmann, NP

## 2022-05-18 DIAGNOSIS — I10 Essential (primary) hypertension: Secondary | ICD-10-CM | POA: Diagnosis not present

## 2022-05-18 DIAGNOSIS — I25708 Atherosclerosis of coronary artery bypass graft(s), unspecified, with other forms of angina pectoris: Secondary | ICD-10-CM | POA: Diagnosis not present

## 2022-05-18 DIAGNOSIS — E119 Type 2 diabetes mellitus without complications: Secondary | ICD-10-CM | POA: Diagnosis not present

## 2022-05-18 DIAGNOSIS — E039 Hypothyroidism, unspecified: Secondary | ICD-10-CM | POA: Diagnosis not present

## 2022-05-21 ENCOUNTER — Telehealth: Payer: Self-pay | Admitting: Cardiology

## 2022-05-21 ENCOUNTER — Emergency Department
Admission: EM | Admit: 2022-05-21 | Discharge: 2022-05-21 | Disposition: A | Discharge status: Home / Self Care | Payer: BC Managed Care – PPO | Attending: Emergency Medicine | Admitting: Emergency Medicine

## 2022-05-21 ENCOUNTER — Other Ambulatory Visit: Payer: Self-pay

## 2022-05-21 DIAGNOSIS — R0789 Other chest pain: Secondary | ICD-10-CM | POA: Diagnosis not present

## 2022-05-21 DIAGNOSIS — I10 Essential (primary) hypertension: Secondary | ICD-10-CM | POA: Diagnosis not present

## 2022-05-21 DIAGNOSIS — E119 Type 2 diabetes mellitus without complications: Secondary | ICD-10-CM | POA: Diagnosis not present

## 2022-05-21 DIAGNOSIS — I2510 Atherosclerotic heart disease of native coronary artery without angina pectoris: Secondary | ICD-10-CM | POA: Diagnosis not present

## 2022-05-21 LAB — LIPID PANEL
Cholesterol: 100 mg/dL (ref 0–200)
HDL: 32 mg/dL — ABNORMAL LOW (ref >40)
LDL Cholesterol: 52 mg/dL (ref 0–99)
Total CHOL/HDL Ratio: 3.1 RATIO
Triglycerides: 82 mg/dL (ref <150)
VLDL: 16 mg/dL (ref 0–40)

## 2022-05-21 LAB — HEPATIC FUNCTION PANEL
ALT: 17 U/L (ref 0–44)
AST: 18 U/L (ref 15–41)
Albumin: 3.9 g/dL (ref 3.5–5.0)
Alkaline Phosphatase: 92 U/L (ref 38–126)
Bilirubin, Direct: 0.2 mg/dL (ref 0.0–0.2)
Indirect Bilirubin: 0.6 mg/dL (ref 0.3–0.9)
Total Bilirubin: 0.8 mg/dL (ref 0.3–1.2)
Total Protein: 6.7 g/dL (ref 6.5–8.1)

## 2022-05-21 LAB — CBC WITH DIFFERENTIAL/PLATELET
Abs Immature Granulocytes: 0.02 10*3/uL (ref 0.00–0.07)
Basophils Absolute: 0.1 10*3/uL (ref 0.0–0.1)
Basophils Relative: 1 %
Eosinophils Absolute: 0.4 10*3/uL (ref 0.0–0.5)
Eosinophils Relative: 5 %
HCT: 43.3 % (ref 39.0–52.0)
Hemoglobin: 14.6 g/dL (ref 13.0–17.0)
Immature Granulocytes: 0 %
Lymphocytes Relative: 19 %
Lymphs Abs: 1.4 10*3/uL (ref 0.7–4.0)
MCH: 32.3 pg (ref 26.0–34.0)
MCHC: 33.7 g/dL (ref 30.0–36.0)
MCV: 95.8 fL (ref 80.0–100.0)
Monocytes Absolute: 0.7 10*3/uL (ref 0.1–1.0)
Monocytes Relative: 9 %
Neutro Abs: 5.1 10*3/uL (ref 1.7–7.7)
Neutrophils Relative %: 66 %
Platelets: 208 10*3/uL (ref 150–400)
RBC: 4.52 MIL/uL (ref 4.22–5.81)
RDW: 13.3 % (ref 11.5–15.5)
WBC: 7.6 10*3/uL (ref 4.0–10.5)
nRBC: 0 % (ref 0.0–0.2)

## 2022-05-21 LAB — BASIC METABOLIC PANEL
Anion gap: 7 (ref 5–15)
BUN: 17 mg/dL (ref 8–23)
CO2: 28 mmol/L (ref 22–32)
Calcium: 8.5 mg/dL — ABNORMAL LOW (ref 8.9–10.3)
Chloride: 103 mmol/L (ref 98–111)
Creatinine, Ser: 0.96 mg/dL (ref 0.61–1.24)
GFR, Estimated: >60 mL/min (ref >60)
Glucose, Bld: 195 mg/dL — ABNORMAL HIGH (ref 70–99)
Potassium: 4.7 mmol/L (ref 3.5–5.1)
Sodium: 138 mmol/L (ref 135–145)

## 2022-05-21 LAB — TROPONIN I (HIGH SENSITIVITY): Troponin I (High Sensitivity): 6 ng/L (ref <18)

## 2022-05-21 LAB — TSH: TSH: 1.02 u[IU]/mL (ref 0.350–4.500)

## 2022-05-21 MED ORDER — AMLODIPINE BESYLATE 5 MG PO TABS: 5.0000 mg | ORAL_TABLET | Freq: Every day | ORAL | 1 refills | Status: DC

## 2022-05-21 MED ORDER — CLONIDINE HCL 0.1 MG PO TABS: 0.1000 mg | ORAL_TABLET | Freq: Two times a day (BID) | ORAL | 0 refills | Status: DC | PRN

## 2022-05-21 MED ORDER — RAMIPRIL 5 MG PO CAPS
10.0000 mg | ORAL_CAPSULE | ORAL | Status: AC
  Administered 2022-05-21: 10 mg via ORAL
  Filled 2022-05-21: qty 2
  Filled 2022-05-21: qty 1

## 2022-05-21 NOTE — ED Provider Notes (Signed)
[facility] Provider Note    Event Date/Time   First MD Initiated Contact with Patient 05/21/22 [phone]     (approximate)   History   Chief Complaint: Chest Pain (PT here)   HPI  [name] is a 62 y.o. male with a history of CAD, diabetes, hypertension, anxiety who comes ED complaining of left-sided chest tingling sensation that radiates to the left arm.  Noticed that 1:30 AM this morning while in bed.  No aggravating or alleviating factors.  Denies shortness of breath diaphoresis vomiting.  No exertional symptoms.  Not pleuritic.    Last ate a snack of peanut butter and crackers at 9:30 PM last night.  Tried Tums and Pepcid without relief, but does note that he burped a few times which had a peanut butter taste.     Physical Exam   Triage Vital Signs: ED Triage Vitals  Enc Vitals Group     BP 05/21/22 0902 (!) 193/106     Pulse Rate 05/21/22 0902 65     Resp 05/21/22 0902 18     Temp 05/21/22 0902 98.1 F (36.7 C)     Temp Source 05/21/22 0902 Oral     SpO2 05/21/22 0902 98 %     Weight 05/21/22 0901 202 lb 9.6 oz (91.9 kg)     Height 05/21/22 0901 '5\' 10"'$  (1.778 m)     Head Circumference --      Peak Flow --      Pain Score 05/21/22 0901 0     Pain Loc --      Pain Edu? --      Excl. in [place]? --     Most recent vital signs: Vitals:   05/21/22 1000 05/21/22 1030  BP: (!) 150/82 (!) 149/87  Pulse: 61 63  Resp: 16 12  Temp:    SpO2: 97% 95%    General: Awake, no distress.  CV:  Good peripheral perfusion.  Regular rate and rhythm, normal distal pulses Resp:  Normal effort.  Clear to auscultation bilaterally Abd:  No distention.  Soft nontender Other:  Oral mucosa, no oropharyngeal erythema or swelling   ED Results / Procedures / Treatments   Labs (all labs ordered are listed, but only abnormal results are displayed) Labs Reviewed  BASIC METABOLIC PANEL - Abnormal; Notable for the following components:      Result Value    Glucose, Bld 195 (*)    Calcium 8.5 (*)    All other components within normal limits  LIPID PANEL - Abnormal; Notable for the following components:   HDL 32 (*)    All other components within normal limits  CBC WITH DIFFERENTIAL/PLATELET  TSH  HEPATIC FUNCTION PANEL  HEMOGLOBIN A1C  TROPONIN I (HIGH SENSITIVITY)  TROPONIN I (HIGH SENSITIVITY)     EKG Interpreted by me Sinus rhythm rate of 65, left axis, normal intervals.  Poor R wave progression.  Normal ST segments and T waves.  No ischemic changes.   RADIOLOGY    PROCEDURES:  Procedures   MEDICATIONS ORDERED IN ED: Medications  ramipril (ALTACE) capsule 10 mg (10 mg Oral Given 05/21/22 0949)     IMPRESSION / MDM / ASSESSMENT AND PLAN / ED COURSE  I reviewed the triage vital signs and the nursing notes.  DDx: Non-STEMI, GERD, hyperthyroidism, symptomatic hypertension, anxiety, electrolyte abnormality  Patient's presentation is most consistent with acute presentation with potential threat to life or bodily function.  Patient presents with left  chest wall paresthesia sensation that started at 1:30 AM, once constant through the morning, now resolved.  Called nurse advice line which recommended ED evaluation.  Symptoms are atypical and not cardiac. Considering the patient's symptoms, medical history, and physical examination today, I have low suspicion for ACS, PE, TAD, pneumothorax, carditis, mediastinitis, pneumonia, CHF, or sepsis. Due to his medical history will obtain lab panel.  Blood pressure initially was 190/106, but on recheck came down to about 140/80.0 will order the patient's morning ramipril as well as prescribed as needed clonidine he can take for episodes of severe hypertension.  Will plan to start him on a low-dose amlodipine.      FINAL CLINICAL IMPRESSION(S) / ED DIAGNOSES   Final diagnoses:  Severe hypertension  Atypical chest pain     Rx / DC Orders   ED Discharge Orders          Ordered     cloNIDine (CATAPRES) 0.1 MG tablet  2 times daily PRN        05/21/22 0935    amLODipine (NORVASC) 5 MG tablet  Daily        05/21/22 0941             Note:  This document was prepared using Dragon voice recognition software and may include unintentional dictation errors.   Carrie Mew, MD 05/21/22 1056

## 2022-05-21 NOTE — Telephone Encounter (Signed)
Pt c/o of Chest Pain: STAT if CP now or developed within 24 hours  1. Are you having CP right now? Yes (she states a little bit but not as much)   2. Are you experiencing any other symptoms (ex. SOB, nausea, vomiting, sweating)? No   3. How long have you been experiencing CP? Started around 1am   4. Is your CP continuous or coming and going? Coming and going   5. Have you taken Nitroglycerin? No   Pt wife called in stating pt has some chest pressure and burning on his left side. She states he is also having a high bp and a little cough. Please advise.  ?

## 2022-05-21 NOTE — Telephone Encounter (Signed)
Pt and wife called stating around 1 am pt was awoken with left sided discomfort that he describes as a wave of tingling heat that runs down his side, up into his chest, arm, and neck. Wife stated she initially believed it was acid reflux as pt keeps coughing, but then symptoms increase in severity. Pt reports he is currently experiencing symptoms and pain down his arm.  Nurse recommended pt report to ER for further evaluations.  Pt verbalized understanding.

## 2022-05-21 NOTE — Discharge Instructions (Addendum)
Your lab test today were all reassuring.  Continue taking ramipril as prescribed.  Start amlodipine 5 mg once a day as well.  You can also take clonidine every 12 hours as needed for blood pressure above 180/100.

## 2022-05-21 NOTE — ED Triage Notes (Signed)
Pt here with cp that started on the left and radiates under his arm. Pt states pain is more tingling and is intermittent. Pt stable in tx room.

## 2022-05-22 LAB — HEMOGLOBIN A1C
Hgb A1c MFr Bld: 6.2 % — ABNORMAL HIGH (ref 4.8–5.6)
Mean Plasma Glucose: 131 mg/dL

## 2022-05-24 ENCOUNTER — Telehealth: Payer: Self-pay

## 2022-05-24 NOTE — Transitions of Care (Post Inpatient/ED Visit) (Signed)
   05/24/2022  Name: [name] MRN: [id number] DOB: Aug 09, 1960  Patient returned a call to the office after 1st unsuccessful attempt.  Call back was completed and patient advised that he is now back established with Dr. Dorma Russell, MD whom is no longer a provider at this practice. No further questions were asked.

## 2022-05-24 NOTE — Transitions of Care (Post Inpatient/ED Visit) (Signed)
   05/24/2022  Name: [name] MRN: [id number] DOB: 31-May-1960  Today's TOC FU Call Status: Today's TOC FU Call Status:: Unsuccessul Call (1st Attempt)  Attempted to reach the patient regarding the most recent Inpatient/ED visit.  Follow Up Plan: Additional outreach attempts will be made to reach the patient to complete the Transitions of Care (Post Inpatient/ED visit) call.   Signature Stephan Minister, [place]

## 2022-05-25 ENCOUNTER — Telehealth: Payer: Self-pay | Admitting: Cardiology

## 2022-05-25 NOTE — Telephone Encounter (Signed)
No labs from ER will be fine

## 2022-05-25 NOTE — Telephone Encounter (Signed)
Patient was in ER had blood work done so asking if he still needs them done again before his 3/11 appt., please assist

## 2022-05-27 ENCOUNTER — Telehealth: Payer: Self-pay

## 2022-05-27 NOTE — Telephone Encounter (Signed)
Called and rescheduled pt to see Cadence at 10:55 am.

## 2022-05-31 ENCOUNTER — Ambulatory Visit: Payer: BC Managed Care – PPO | Admitting: Cardiology

## 2022-05-31 ENCOUNTER — Ambulatory Visit: Payer: BC Managed Care – PPO | Attending: Cardiology | Admitting: Medical

## 2022-05-31 ENCOUNTER — Encounter: Payer: Self-pay | Admitting: Medical

## 2022-05-31 VITALS — BP 140/84 | HR 56 | Ht 70.0 in | Wt 201.2 lb

## 2022-05-31 DIAGNOSIS — E782 Mixed hyperlipidemia: Secondary | ICD-10-CM | POA: Diagnosis not present

## 2022-05-31 DIAGNOSIS — I2510 Atherosclerotic heart disease of native coronary artery without angina pectoris: Secondary | ICD-10-CM | POA: Diagnosis not present

## 2022-05-31 DIAGNOSIS — I10 Essential (primary) hypertension: Secondary | ICD-10-CM | POA: Diagnosis not present

## 2022-05-31 DIAGNOSIS — I4729 Other ventricular tachycardia: Secondary | ICD-10-CM | POA: Diagnosis not present

## 2022-05-31 MED ORDER — ATORVASTATIN CALCIUM 80 MG PO TABS: ORAL_TABLET | ORAL | 3 refills | Status: DC

## 2022-05-31 NOTE — Patient Instructions (Addendum)
Medication Instructions:   Your physician has recommended you make the following change in your medication:  STOP Amlodipine.  *If you need a refill on your cardiac medications before your next appointment, please call your pharmacy*   Lab Work:  NONE  If you have labs (blood work) drawn today and your tests are completely normal, you will receive your results only by: [place] (if you have MyChart) OR A paper copy in the mail If you have any lab test that is abnormal or we need to change your treatment, we will call you to review the results.   Testing/Procedures:  Echo 06/17/2022 @ 8:30 am.  Follow-Up: At [facility], you and your health needs are our priority.  As part of our continuing mission to provide you with exceptional heart care, we have created designated Provider Care Teams.  These Care Teams include your primary Cardiologist (physician) and Advanced Practice Providers (APPs -  Physician Assistants and Nurse Practitioners) who all work together to provide you with the care you need, when you need it.  We recommend signing up for the patient portal called "MyChart".  Sign up information is provided on this After Visit Summary.  MyChart is used to connect with patients for Virtual Visits (Telemedicine).  Patients are able to view lab/test results, encounter notes, upcoming appointments, etc.  Non-urgent messages can be sent to your provider as well.   To learn more about what you can do with MyChart, go to [email or web address].    Your next appointment:    Keep your future appointment with Dr. Saunders Revel 09/15/2022 @ 10:00 am.

## 2022-05-31 NOTE — Progress Notes (Signed)
Cardiology Office Note:    Date:  05/31/2022   ID:  [name], DOB [birth year], MRN [id number]  PCP:  Eulas Post, MD  [facility] Cardiologist:  Nelva Bush, MD  [facility] Electrophysiologist:  None   Referring MD: Eulas Post, MD   Chief Complaint: 2 week follow-up/Er follow-up  History of Present Illness:    [name] is a 62 y.o. male with a hx of CAD, monomorphic VT, HTN, HLD, DM2, hypothyroidism, hyperparathyroidism, and anxiety who presents for 2 week follow-up.   He has a significant past medical history of coronary artery disease with his first myocardial infarction at the age of 14.  At that time he underwent successful PCI/DES of the proximal and distal area of the right coronary.  He had a follow-up left heart catheterization in 11/2008 which demonstrated nonobstructive disease and ejection fraction 55%.  His last evaluation by cardiac catheterization was 09/2011 which showed nonobstructive disease as well.  He had a longstanding history of symptomatic monomorphic ventricular tachycardia and has been treated with Betapace therapy and has been well-controlled at that time.   He was last seen 05/14/22 for elevated BP and chest twinges. Ramipril was increased. Prior ED visit for chest pain with negative work-up, felt to be MSK pain.   Today, the patient reports he has been doing well. He went to the ER again with chest burning on the left arm and finges were numb. May have been an anxiety attack. BP was severely elevated. Cardiac work-up was negative in the ER.  He was given amlodipine and clonidine as needed, which she has not started.  He recently started taking Sertraline. Since starting medication he has been doing much better with no further chest pain episodes.. BP has been 120/70s at home. He denies shortness of breath, lower leg edema, pnd or orthopnea. BP re-check 130/80.  He has been walking some, 1/2 mile since the episode.   Past Medical  History:  Diagnosis Date   Arthritis    Coronary artery disease    Diabetes mellitus    Dyslipidemia    Gout    History of tobacco use    Hypertension    Hypothyroidism    Old inferior myocardial infarction    Parathyroid adenoma    Sleep apnea    does not use cpap   Thyroid disease    Ventricular tachycardia ([place])    Symptomatic ventricular tachycardia    Past Surgical History:  Procedure Laterality Date   CARDIAC CATHETERIZATION  11/2008   nonobstructive CAD, left main normal, LAD normal, minor LCx irregularities <10%, mRCA 30% ISR, dRCA 30% narrowing proximal to PDA and PLB bifurcation; stented segments widely patent, LVEF 55%; severe inferior basal wall hypokinesis   CARDIAC CATHETERIZATION  08/2004   Acute inferior MI 2/2 dRCA occlusion s/p 2.5 x 24 mm Taxus stent and a 2.75 x 20 mm Taxus stent at the crux; LVEF 45%, moderate akinesis of the inferior wall   CARDIAC CATHETERIZATION  2000   pRCA 4.0 x 13 mm MultiLink stent, dRCA 3.0 x 8 mm MultiLink stent   CARDIAC CATHETERIZATION  10/06/11   normal left main, LAD, D1-3, LCx and OM1-OM3; 20% prox RCA stenosis, 40-50% ISR mid RCA, 20% ISR distal RCA; LVEF 45-50%; mild basal inferior wall hypokinesis.   CHOLECYSTECTOMY     LEFT HEART CATHETERIZATION WITH CORONARY ANGIOGRAM N/A 10/06/2011   Procedure: LEFT HEART CATHETERIZATION WITH CORONARY ANGIOGRAM;  Surgeon: Wellington Hampshire, MD;  Location:  [facility] CATH LAB;  Service: Cardiovascular;  Laterality: N/A;   OTHER SURGICAL HISTORY     Status post removal of parathyroid adenoma   parathroid      Current Medications: Current Meds  Medication Sig   aspirin 81 MG tablet Take 81 mg by mouth daily.   famotidine (PEPCID) 20 MG tablet Take 1 tablet (20 mg total) by mouth 2 (two) times daily.   glipiZIDE (GLUCOTROL) 5 MG tablet Take 1 tablet (5 mg total) by mouth daily before breakfast.   Glucose Blood (BLOOD GLUCOSE TEST STRIPS) STRP Check blood sugars once daily.   lamoTRIgine  (LAMICTAL) 200 MG tablet Take 1 tablet (200 mg total) by mouth daily.   levothyroxine (SYNTHROID) 150 MCG tablet Take 1 tablet (150 mcg total) by mouth daily before breakfast.   nitroGLYCERIN (NITROSTAT) 0.4 MG SL tablet Place 1 tablet under the tongue every 5 minutes for 3 doses as needed for chest pain. **Please contact your provider's office for further refills [phone]).**   pioglitazone-metformin (ACTOPLUS MET) 15-850 MG tablet Take 1 tablet by mouth twice daily. Appointment needed.   ramipril (ALTACE) 10 MG capsule Take one tablet (10 mg) in the AM and 1/2 tablet ( 5 mg) by mouth in the PM.   SOTALOL AF 120 MG TABS Take 1 tablet (120 mg total) by mouth 2 (two) times daily.   temazepam (RESTORIL) 15 MG capsule Take 1-2 capsules (15-30 mg total) by mouth at bedtime as needed for sleep.   [DISCONTINUED] atorvastatin (LIPITOR) 80 MG tablet Take 1 tablet by mouth daily.  **Please attend upcoming office visit with labs scheduled for 05/20/21 for further refills.**     Allergies:   Crestor [rosuvastatin calcium] and Penicillins   Social History   Socioeconomic History   Marital status: Married    Spouse name: Not on file   Number of children: 3   Years of education: Not on file   Highest education level: Not on file  Occupational History   Occupation: [employer]  Tobacco Use   Smoking status: Former   Smokeless tobacco: Current    Types: Snuff  Vaping Use   Vaping Use: Never used  Substance and Sexual Activity   Alcohol use: No   Drug use: No   Sexual activity: Yes  Other Topics Concern   Not on file  Social History Narrative   Lives in [place], [place] with wife.    Social Determinants of Health   Financial Resource Strain: Not on file  Food Insecurity: Not on file  Transportation Needs: Not on file  Physical Activity: Not on file  Stress: Not on file  Social Connections: Not on file     Family History: The patient's family history includes Alzheimer's disease in  his father; Diabetes in his mother; Heart attack in his father; Stroke in his father.  ROS:   Please see the history of present illness.     All other systems reviewed and are negative.  EKGs/Labs/Other Studies Reviewed:    The following studies were reviewed today:  Echo ordered  EKG:  EKG is ordered today.  The ekg ordered today demonstrates SB 56bpm, LAD, iRBBB, TWI III  Recent Labs: 05/21/2022: ALT 17; BUN 17; Creatinine, Ser 0.96; Hemoglobin 14.6; Platelets 208; Potassium 4.7; Sodium 138; TSH 1.020  Recent Lipid Panel    Component Value Date/Time   CHOL 100 05/21/2022 0923   CHOL 129 06/18/2021 0822   TRIG 82 05/21/2022 0923   HDL 32 (L) 05/21/2022 [id number]  HDL 30 (L) 06/18/2021 0822   CHOLHDL 3.1 05/21/2022 0923   VLDL 16 05/21/2022 0923   LDLCALC 52 05/21/2022 0923   LDLCALC 77 06/18/2021 0822   LDLCALC 67 01/05/2017 0936    Physical Exam:    VS:  BP (!) 140/84 (BP Location: Left Arm, Patient Position: Sitting, Cuff Size: Normal)   Pulse (!) 56   Ht '5\' 10"'$  (1.778 m)   Wt 201 lb 3.2 oz (91.3 kg)   SpO2 99%   BMI 28.87 kg/m     Wt Readings from Last 3 Encounters:  05/31/22 201 lb 3.2 oz (91.3 kg)  05/21/22 202 lb 9.6 oz (91.9 kg)  05/14/22 202 lb 9.6 oz (91.9 kg)     GEN:  Well nourished, well developed in no acute distress HEENT: Normal NECK: No JVD; No carotid bruits LYMPHATICS: No lymphadenopathy CARDIAC: RRR, no murmurs, rubs, gallops RESPIRATORY:  Clear to auscultation without rales, wheezing or rhonchi  ABDOMEN: Soft, non-tender, non-distended MUSCULOSKELETAL:  No edema; No deformity  SKIN: Warm and dry NEUROLOGIC:  Alert and oriented x 3 PSYCHIATRIC:  Normal affect   ASSESSMENT:    1. Essential (primary) hypertension   2. Coronary artery disease involving native coronary artery of native heart without angina pectoris   3. Ventricular tachycardia, monomorphic ([place])   4. Mixed hyperlipidemia    PLAN:    In order of problems listed  above:  HTN Blood Pressure today 140/84.  Recheck blood pressure 130/80.  Patient is taking ramipril 10 mg in the a.m. and 5 mg in the p.m.  Patient had a recent ER visit for chest pain found to have severely elevated blood pressure to 193/106.  In the ER he was was given amlodipine 5 mg daily and clonidine 0. 1 mg to take as needed for high blood pressure.  He has not started either of these medications.  At home, blood pressures running 120s over 70s.  Patient is making lifestyle changes with diet and walking.  At this point, I think it is reasonable to wait to start amlodipine or clonidine.  Continue with ramipril 10 mg in the a.m. and 5 mg in the p.m.  We will reevaluate blood pressure at follow-up.  Chest pain CAD Recent ER visit for chest pain, cardiac workup was largely unrevealing.  Suspect chest pain largely from anxiety with a component of high blood pressure .  The patient has been started on Sertraline by his PCP.  Since starting this medication he reports no further chest pain episodes.  Echo has been ordered and is scheduled for later this month.  No further cardiac workup at this time.  Continue aspirin 81 mg daily, Lipitor 80 mg daily  History of VT Continue sotalol 120 mg twice daily.  Hyperlipidemia LDL 77.  Continue Lipitor 80 mg daily.   Disposition: Follow up in 4 month(s) with MD/APP     Signed, Kaydence Menard Ninfa Meeker, PA-C  05/31/2022 11:20 AM    [place]

## 2022-06-17 ENCOUNTER — Ambulatory Visit: Payer: BC Managed Care – PPO

## 2022-06-17 DIAGNOSIS — I2510 Atherosclerotic heart disease of native coronary artery without angina pectoris: Secondary | ICD-10-CM | POA: Diagnosis not present

## 2022-06-17 LAB — ECHOCARDIOGRAM COMPLETE
AR max vel: 4.44 cm2
AV Area VTI: 4.25 cm2
AV Area mean vel: 4.29 cm2
AV Mean grad: 2 mmHg
AV Peak grad: 3.3 mmHg
Ao pk vel: 0.91 m/s
Area-P 1/2: 3.42 cm2
Calc EF: 41.8 %
S' Lateral: 4.55 cm
Single Plane A2C EF: 40.3 %
Single Plane A4C EF: 42.4 %

## 2022-06-22 ENCOUNTER — Telehealth: Payer: Self-pay | Admitting: Internal Medicine

## 2022-06-22 ENCOUNTER — Other Ambulatory Visit: Payer: Self-pay | Admitting: Family Medicine

## 2022-06-22 NOTE — Telephone Encounter (Signed)
Patient is returning call. Transferred to [place], [place].

## 2022-06-22 NOTE — Telephone Encounter (Signed)
See result note.  

## 2022-06-23 DIAGNOSIS — I252 Old myocardial infarction: Secondary | ICD-10-CM | POA: Diagnosis not present

## 2022-06-23 DIAGNOSIS — I25118 Atherosclerotic heart disease of native coronary artery with other forms of angina pectoris: Secondary | ICD-10-CM | POA: Diagnosis not present

## 2022-06-23 DIAGNOSIS — E039 Hypothyroidism, unspecified: Secondary | ICD-10-CM | POA: Diagnosis not present

## 2022-06-23 DIAGNOSIS — F330 Major depressive disorder, recurrent, mild: Secondary | ICD-10-CM | POA: Diagnosis not present

## 2022-06-23 NOTE — Telephone Encounter (Signed)
Unable to refill per protocol, Rx request is too soon. Last refill 03/29/22 for 90 days and 1 refill.  Requested Prescriptions  Pending Prescriptions Disp Refills   levothyroxine (SYNTHROID) 150 MCG tablet [Pharmacy Med Name: Levothyroxine Sodium 175mcg Tablet] 90 tablet 0    Sig: Take 1 tablet by mouth daily before breakfast.     Endocrinology:  Hypothyroid Agents Passed - 06/22/2022  8:37 PM      Passed - TSH in normal range and within 360 days    TSH  Date Value Ref Range Status  05/21/2022 1.020 0.350 - 4.500 uIU/mL Final    Comment:    Performed by a 3rd Generation assay with a functional sensitivity of <=0.01 uIU/mL. Performed at [facility], [address] [place], [place] [address]   06/18/2021 1.790 0.450 - 4.500 uIU/mL Final         Passed - Valid encounter within last 12 months    Recent Outpatient Visits           4 months ago Anxiety, generalized   [place] Birdie Sons, MD   7 months ago Annual physical exam   [facility] Eulas Post, MD   1 year ago Essential hypertension   [place] Eulas Post, MD   1 year ago Type 2 diabetes mellitus with other specified complication, without long-term current use of insulin [facility])   [place] Eulas Post, MD   1 year ago Type 2 diabetes mellitus with other specified complication, without long-term current use of insulin ([place])   [place], MD       Future Appointments             In 2 months End, Harrell Gave, MD [place] at [facility]

## 2022-06-28 ENCOUNTER — Other Ambulatory Visit: Payer: Self-pay | Admitting: Family Medicine

## 2022-06-28 ENCOUNTER — Other Ambulatory Visit: Payer: Self-pay | Admitting: Physician Assistant

## 2022-06-28 DIAGNOSIS — E1169 Type 2 diabetes mellitus with other specified complication: Secondary | ICD-10-CM

## 2022-06-28 MED ORDER — ENTRESTO 24-26 MG PO TABS: 1.0000 | ORAL_TABLET | Freq: Two times a day (BID) | ORAL | 1 refills | Status: DC

## 2022-06-28 NOTE — Telephone Encounter (Signed)
Please clarify---Note from pharmacy asking if Sherryll Burger is to replace ramipril.

## 2022-06-29 NOTE — Telephone Encounter (Signed)
Patient no longer [facility] patient Requested Prescriptions  Pending Prescriptions Disp Refills   pioglitazone-metformin (ACTOPLUS MET) 15-850 MG tablet [Pharmacy Med Name: Pioglitazone Hydrochloride and Metformin Hydrochloride 15mg -850mg  Tablet] 180 tablet 0    Sig: Take 1 tablet by mouth twice daily.     Endocrinology:  Diabetes - Biguanide + Pioglitazone Combo Failed - 06/28/2022  3:29 PM      Failed - B12 Level in normal range and within 720 days    No results found for: "VITAMINB12"       Passed - Cr in normal range and within 360 days    Creat  Date Value Ref Range Status  01/05/2017 0.93 0.70 - 1.33 mg/dL Final    Comment:    For patients >28 years of age, the reference limit for Creatinine is approximately 13% higher for people identified as African-American. .    Creatinine, Ser  Date Value Ref Range Status  05/21/2022 0.96 0.61 - 1.24 mg/dL Final         Passed - HBA1C is between 0 and 7.9 and within 180 days    Hgb A1c MFr Bld  Date Value Ref Range Status  05/21/2022 6.2 (H) 4.8 - 5.6 % Final    Comment:    (NOTE)         Prediabetes: 5.7 - 6.4         Diabetes: >6.4         Glycemic control for adults with diabetes: <7.0          Passed - eGFR in normal range and within 360 days    GFR, Est African American  Date Value Ref Range Status  01/05/2017 105 > OR = 60 mL/min/1.7m2 Final   GFR calc Af Amer  Date Value Ref Range Status  03/13/2020 90 >59 mL/min/1.73 Final    Comment:    **In accordance with recommendations from the NKF-ASN Task force,**   [facility] is in the process of updating its eGFR calculation to the   2021 CKD-EPI creatinine equation that estimates kidney function   without a race variable.    GFR, Est Non African American  Date Value Ref Range Status  01/05/2017 90 > OR = 60 mL/min/1.60m2 Final   GFR, Estimated  Date Value Ref Range Status  05/21/2022 >60 >60 mL/min Final    Comment:    (NOTE) Calculated using the CKD-EPI Creatinine  Equation (2021)    eGFR  Date Value Ref Range Status  06/18/2021 83 >59 mL/min/1.73 Final         Passed - Valid encounter within last 6 months    Recent Outpatient Visits           4 months ago Anxiety, generalized   [place] [facility] Malva Limes, MD   7 months ago Annual physical exam   [facility] Bosie Clos, MD   1 year ago Essential hypertension   [place] [facility] Bosie Clos, MD   1 year ago Type 2 diabetes mellitus with other specified complication, without long-term current use of insulin [facility])   [place] [facility] Bosie Clos, MD   1 year ago Type 2 diabetes mellitus with other specified complication, without long-term current use of insulin [facility])    [facility] Bosie Clos, MD       Future Appointments             In 2 months  End, Cristal Deer, MD [facility] - CBC within normal limits and completed in the last 12 months    WBC  Date Value Ref Range Status  05/21/2022 7.6 4.0 - 10.5 K/uL Final   RBC  Date Value Ref Range Status  05/21/2022 4.52 4.22 - 5.81 MIL/uL Final   Hemoglobin  Date Value Ref Range Status  05/21/2022 14.6 13.0 - 17.0 g/dL Final  50/11/3816 29.9 13.0 - 17.7 g/dL Final   HCT  Date Value Ref Range Status  05/21/2022 43.3 39.0 - 52.0 % Final   Hematocrit  Date Value Ref Range Status  06/18/2021 44.0 37.5 - 51.0 % Final   MCHC  Date Value Ref Range Status  05/21/2022 33.7 30.0 - 36.0 g/dL Final   [facility]  Date Value Ref Range Status  05/21/2022 32.3 26.0 - 34.0 pg Final   MCV  Date Value Ref Range Status  05/21/2022 95.8 80.0 - 100.0 fL Final  06/18/2021 97 79 - 97 fL Final   No results found for: "PLTCOUNTKUC", "LABPLAT", "POCPLA" RDW  Date Value Ref Range Status  05/21/2022 13.3 11.5 - 15.5 % Final  06/18/2021 12.1 11.6 - 15.4 %  Final          ramipril (ALTACE) 5 MG capsule [Pharmacy Med Name: Ramipril 5mg  Capsule] 90 capsule 0    Sig: Take 1 capsule by mouth daily.     Cardiovascular:  ACE Inhibitors Failed - 06/28/2022  3:29 PM      Failed - Last BP in normal range    BP Readings from Last 1 Encounters:  05/31/22 (!) 140/84         Passed - Cr in normal range and within 180 days    Creat  Date Value Ref Range Status  01/05/2017 0.93 0.70 - 1.33 mg/dL Final    Comment:    For patients >75 years of age, the reference limit for Creatinine is approximately 13% higher for people identified as African-American. .    Creatinine, Ser  Date Value Ref Range Status  05/21/2022 0.96 0.61 - 1.24 mg/dL Final         Passed - K in normal range and within 180 days    Potassium  Date Value Ref Range Status  05/21/2022 4.7 3.5 - 5.1 mmol/L Final         Passed - Patient is not pregnant      Passed - Valid encounter within last 6 months    Recent Outpatient Visits           4 months ago Anxiety, generalized   [facility] Malva Limes, MD   7 months ago Annual physical exam   [facility]   1 year ago Essential hypertension   [place] [facility] Bosie Clos, MD   1 year ago Type 2 diabetes mellitus with other specified complication, without long-term current use of insulin [facility])   [place] [facility] Bosie Clos, MD   1 year ago Type 2 diabetes mellitus with other specified complication, without long-term current use of insulin [facility])   [place] [facility] Bosie Clos, MD       Future Appointments             In 2 months End, Cristal Deer, MD [facility]

## 2022-06-30 ENCOUNTER — Telehealth: Payer: Self-pay | Admitting: Internal Medicine

## 2022-06-30 NOTE — Telephone Encounter (Signed)
Pt c/o medication issue:  1. Name of Medication: sacubitril-valsartan (ENTRESTO) 24-26 MG   2. How are you currently taking this medication (dosage and times per day)? Take 1 tablet by mouth 2 (two) times daily.  3. Are you having a reaction (difficulty breathing--STAT)? No   4. What is your medication issue? [employer] is calling to ask a clinical question about if this medication is replacing the Ramipril.

## 2022-06-30 NOTE — Telephone Encounter (Signed)
Pharmacy sent an escript clarification and this has been addressed in that encounter.

## 2022-07-06 NOTE — Telephone Encounter (Signed)
Patient was contacted today to go over questions. He stopped his Ramipril almost two days ago and BP is in the 120s systolic. He was wondering about starting Entresto.   Agreed to wait on additional day to ensure medication is out of his system before initiating Entresto. Discussed with the patient to take BP tomorrow morning and if > 130 SBP should be all good to start Entresto.   Sharlene Dory, PA-C

## 2022-08-31 DIAGNOSIS — F330 Major depressive disorder, recurrent, mild: Secondary | ICD-10-CM | POA: Diagnosis not present

## 2022-08-31 DIAGNOSIS — E118 Type 2 diabetes mellitus with unspecified complications: Secondary | ICD-10-CM | POA: Diagnosis not present

## 2022-08-31 DIAGNOSIS — I25118 Atherosclerotic heart disease of native coronary artery with other forms of angina pectoris: Secondary | ICD-10-CM | POA: Diagnosis not present

## 2022-08-31 DIAGNOSIS — E039 Hypothyroidism, unspecified: Secondary | ICD-10-CM | POA: Diagnosis not present

## 2022-09-10 NOTE — Progress Notes (Unsigned)
Follow-up Outpatient Visit Date: 09/13/2022  Primary Care Provider: Bosie Clos, MD [address] [place] [address]  Chief Complaint: Follow-up abnormal echo  HPI:  [name] is a 62 y.o. male with history of coronary artery disease, ventricular tachycardia on sotalol, hypertension, hyperlipidemia, type 2 diabetes mellitus, hypoparathyroidism, and hypothyroidism, who presents for follow-up of hypertension.  He was previously followed in [place] by Dr. [place] but has transitioned his care to our office.  He was seen in late February by Charlsie Quest, NP, for evaluation of uncontrolled hypertension.  He also noted atypical chest pain thought to be musculoskeletal in nature.  His ramipril was increased from 10 mg daily to 10 mg every morning and 5 mg every afternoon.  He subsequently presented to the ED on 05/21/2022 with chest pain and uncontrolled hypertension (initial blood pressure 193/106).  It improved to 140/80 on recheck.  He was given a prescription for as needed clonidine and also standing amlodipine.  He saw Cadence Furth, [place], on 05/31/2022, which time his blood pressure was 140/84.  He has not had any further chest pain, noting that he had also been started on sertraline for anxiety by his PCP.  He underwent echocardiogram in late March which showed LVEF of 40-45% with global hypokinesis and mild mitral regurgitation.  This prompted a transition from ramipril to [place].  Today, [name] reports that he has been feeling fairly well.  He has noticed a little more frequent orthostatic lightheadedness since switching from ramipril to [place].  This is typically brief and self-limited, lasting about 5 to 10 seconds.  He has not passed out or fallen.  He notes that his home blood pressure is usually 110-120/60-80.  He has not had any chest pain, shortness of breath, palpitations, or edema.  His only taken his as needed clonidine once since it was prescribed in the ED in early  March.  --------------------------------------------------------------------------------------------------  Past Medical History:  Diagnosis Date   Arthritis    Coronary artery disease    Diabetes mellitus    Dyslipidemia    Gout    History of tobacco use    Hypertension    Hypothyroidism    Old inferior myocardial infarction    Parathyroid adenoma    Sleep apnea    does not use cpap   Thyroid disease    Ventricular tachycardia (HCC)    Symptomatic ventricular tachycardia   Past Surgical History:  Procedure Laterality Date   CARDIAC CATHETERIZATION  11/2008   nonobstructive CAD, left main normal, LAD normal, minor LCx irregularities <10%, mRCA 30% ISR, dRCA 30% narrowing proximal to PDA and PLB bifurcation; stented segments widely patent, LVEF 55%; severe inferior basal wall hypokinesis   CARDIAC CATHETERIZATION  08/2004   Acute inferior MI 2/2 dRCA occlusion s/p 2.5 x 24 mm Taxus stent and a 2.75 x 20 mm Taxus stent at the crux; LVEF 45%, moderate akinesis of the inferior wall   CARDIAC CATHETERIZATION  2000   pRCA 4.0 x 13 mm MultiLink stent, dRCA 3.0 x 8 mm MultiLink stent   CARDIAC CATHETERIZATION  10/06/11   normal left main, LAD, D1-3, LCx and OM1-OM3; 20% prox RCA stenosis, 40-50% ISR mid RCA, 20% ISR distal RCA; LVEF 45-50%; mild basal inferior wall hypokinesis.   CHOLECYSTECTOMY     LEFT HEART CATHETERIZATION WITH CORONARY ANGIOGRAM N/A 10/06/2011   Procedure: LEFT HEART CATHETERIZATION WITH CORONARY ANGIOGRAM;  Surgeon: Iran Ouch, MD;  Location: [facility] CATH LAB;  Service: Cardiovascular;  Laterality: N/A;   OTHER SURGICAL HISTORY     Status post removal of parathyroid adenoma   parathroid      Current Meds  Medication Sig   aspirin 81 MG tablet Take 81 mg by mouth daily.   atorvastatin (LIPITOR) 80 MG tablet Take 1 tablet by mouth daily.  **Please attend upcoming office visit with labs scheduled for 05/20/21 for further refills.**   ENTRESTO 24-26 MG Take 1  tablet by mouth twice daily.   famotidine (PEPCID) 20 MG tablet Take 1 tablet (20 mg total) by mouth 2 (two) times daily.   Glucose Blood (BLOOD GLUCOSE TEST STRIPS) STRP Check blood sugars once daily.   lamoTRIgine (LAMICTAL) 200 MG tablet Take 1 tablet (200 mg total) by mouth daily.   levothyroxine (SYNTHROID) 150 MCG tablet Take 1 tablet (150 mcg total) by mouth daily before breakfast.   nitroGLYCERIN (NITROSTAT) 0.4 MG SL tablet Place 1 tablet under the tongue every 5 minutes for 3 doses as needed for chest pain. **Please contact your provider's office for further refills [phone]).**   pioglitazone-metformin (ACTOPLUS MET) 15-850 MG tablet Take 1 tablet by mouth twice daily. Appointment needed.   sertraline (ZOLOFT) 100 MG tablet Take 100 mg by mouth daily.   SOTALOL AF 120 MG TABS Take 1 tablet (120 mg total) by mouth 2 (two) times daily.   temazepam (RESTORIL) 15 MG capsule Take 1-2 capsules (15-30 mg total) by mouth at bedtime as needed for sleep.    Allergies: Crestor [rosuvastatin calcium] and Penicillins  Social History   Tobacco Use   Smoking status: Former   Smokeless tobacco: Current    Types: Snuff  Vaping Use   Vaping Use: Never used  Substance Use Topics   Alcohol use: No   Drug use: No    Family History  Problem Relation Age of Onset   Heart attack Father    Stroke Father    Alzheimer's disease Father    Diabetes Mother     Review of Systems: A 12-system review of systems was performed and was negative except as noted in the HPI.  --------------------------------------------------------------------------------------------------  Physical Exam: BP 134/78 (BP Location: Left Arm, Patient Position: Sitting, Cuff Size: Normal)   Pulse (!) 59   Ht 5\' 10"  (1.778 m)   Wt 187 lb 3.2 oz (84.9 kg)   SpO2 98%   BMI 26.86 kg/m   General:  NAD. Neck: No JVD or HJR. Lungs: Clear to auscultation bilaterally without wheezes or crackles. Heart: Regular rate  and rhythm without murmurs, rubs, or gallops. Abdomen: Soft, nontender, nondistended. Extremities: No lower extremity edema.  EKG: Normal sinus rhythm with left axis deviation and inferior infarct.  No significant change from prior tracing on 05/31/2022.  Lab Results  Component Value Date   WBC 7.6 05/21/2022   HGB 14.6 05/21/2022   HCT 43.3 05/21/2022   MCV 95.8 05/21/2022   PLT 208 05/21/2022    Lab Results  Component Value Date   NA 138 05/21/2022   K 4.7 05/21/2022   CL 103 05/21/2022   CO2 28 05/21/2022   BUN 17 05/21/2022   CREATININE 0.96 05/21/2022   GLUCOSE 195 (H) 05/21/2022   ALT 17 05/21/2022    Lab Results  Component Value Date   CHOL 100 05/21/2022   HDL 32 (L) 05/21/2022   LDLCALC 52 05/21/2022   TRIG 82 05/21/2022   CHOLHDL 3.1 05/21/2022    --------------------------------------------------------------------------------------------------  ASSESSMENT AND PLAN: HFmrEF: [name] appears euvolemic and  is without symptoms of decompensated heart failure though he notes that his activity is fairly limited (NYHA class I-II).  He is tolerating Entresto well though he notes a little more orthostatic lightheadedness since switching from ramipril.  His blood pressure today is upper normal but typically better at home.  We will continue current dose of Entresto.  I would like to start an evidence-based beta-blocker but am reluctant to do so given ongoing sotalol therapy and resting heart rate in the upper 50s.  We will refer [name] to electrophysiology to determine if sotalol is still an appropriate medication for him in the setting of his monomorphic ventricular tachycardia many years ago.  If alternative therapy or discontinuation altogether is appropriate, I would favor adding low-dose carvedilol in the future.  I will also defer adding an SGLT2 inhibitor and aldosterone antagonist for the time being given his orthostatic lightheadedness, though hopefully we can  reconsider this in the future.  Coronary artery disease: No angina reported.  However in the setting of known CAD and reduced LVEF, I have recommended that we proceed with left heart catheterization to ensure that he does not have new coronary artery disease to explain his drop in LVEF.  [name] is in agreement with this but would like to defer catheterization until after he has returned from his trip to [place] next month.  We will therefore defer scheduling the cath and have him follow-up in about a month to reassess his symptoms and arrange for catheterization at that time.  Continue secondary prevention with aspirin and atorvastatin.  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  He has been on sotalol since then though I am not sure this is still a good long-term option for him in the setting of his new cardiomyopathy, particularly as it precludes use of an evidence-based beta-blocker such as metoprolol succinate, carvedilol, or bisoprolol.  I will refer [name] to EP for further recommendations.  Hypertension: Blood pressure mildly today but typically better at home.  Continue current regimen of Entresto and sotalol.  Hyperlipidemia associated with type 2 diabetes mellitus: Lipids well-controlled on last check in March with LDL of 52.  Continue atorvastatin 80 mg daily and ongoing management of DM per PCP.  Would favor consideration of an SGLT2 inhibitor in the future with concurrent HFmrEF.  Follow-up: Return to clinic in 1 month.  Yvonne Kendall, MD 09/13/2022 8:11 AM

## 2022-09-13 ENCOUNTER — Encounter: Payer: Self-pay | Admitting: Internal Medicine

## 2022-09-13 ENCOUNTER — Ambulatory Visit: Payer: BC Managed Care – PPO | Attending: Cardiology | Admitting: Internal Medicine

## 2022-09-13 VITALS — BP 134/78 | HR 59 | Ht 70.0 in | Wt 187.2 lb

## 2022-09-13 DIAGNOSIS — E785 Hyperlipidemia, unspecified: Secondary | ICD-10-CM

## 2022-09-13 DIAGNOSIS — I4729 Other ventricular tachycardia: Secondary | ICD-10-CM

## 2022-09-13 DIAGNOSIS — I2510 Atherosclerotic heart disease of native coronary artery without angina pectoris: Secondary | ICD-10-CM

## 2022-09-13 DIAGNOSIS — I5022 Chronic systolic (congestive) heart failure: Secondary | ICD-10-CM | POA: Diagnosis not present

## 2022-09-13 DIAGNOSIS — E1169 Type 2 diabetes mellitus with other specified complication: Secondary | ICD-10-CM

## 2022-09-13 DIAGNOSIS — I10 Essential (primary) hypertension: Secondary | ICD-10-CM

## 2022-09-13 NOTE — Patient Instructions (Signed)
Medication Instructions:  Your physician recommends that you continue on your current medications as directed. Please refer to the Current Medication list given to you today.  *If you need a refill on your cardiac medications before your next appointment, please call your pharmacy*   Lab Work: None ordered today   Testing/Procedures: None ordered today   Follow-Up: At [facility], you and your health needs are our priority.  As part of our continuing mission to provide you with exceptional heart care, we have created designated Provider Care Teams.  These Care Teams include your primary Cardiologist (physician) and Advanced Practice Providers (APPs -  Physician Assistants and Nurse Practitioners) who all work together to provide you with the care you need, when you need it.  We recommend signing up for the patient portal called "MyChart".  Sign up information is provided on this After Visit Summary.  MyChart is used to connect with patients for Virtual Visits (Telemedicine).  Patients are able to view lab/test results, encounter notes, upcoming appointments, etc.  Non-urgent messages can be sent to your provider as well.   To learn more about what you can do with MyChart, go to [email or web address].    Your next appointment:   1 month(s)  Provider:   You may see Yvonne Kendall, MD or one of the following Advanced Practice Providers on your designated Care Team:   Nicolasa Ducking, NP Eula Listen, PA-C Cadence Fransico Michael, PA-C Charlsie Quest, NP

## 2022-09-15 ENCOUNTER — Ambulatory Visit: Payer: BC Managed Care – PPO | Admitting: Internal Medicine

## 2022-09-27 ENCOUNTER — Ambulatory Visit: Payer: BC Managed Care – PPO | Admitting: Cardiology

## 2022-10-26 ENCOUNTER — Encounter: Payer: Self-pay | Admitting: Cardiology

## 2022-10-26 ENCOUNTER — Other Ambulatory Visit
Admission: RE | Admit: 2022-10-26 | Discharge: 2022-10-26 | Disposition: A | Discharge status: Home / Self Care | Payer: BC Managed Care – PPO | Attending: Cardiology | Admitting: Cardiology

## 2022-10-26 ENCOUNTER — Ambulatory Visit: Payer: BC Managed Care – PPO | Attending: Cardiology | Admitting: Cardiology

## 2022-10-26 VITALS — BP 158/86 | HR 56 | Ht 70.0 in | Wt 183.2 lb

## 2022-10-26 DIAGNOSIS — E1129 Type 2 diabetes mellitus with other diabetic kidney complication: Secondary | ICD-10-CM

## 2022-10-26 DIAGNOSIS — Z7984 Long term (current) use of oral hypoglycemic drugs: Secondary | ICD-10-CM

## 2022-10-26 DIAGNOSIS — I5022 Chronic systolic (congestive) heart failure: Secondary | ICD-10-CM | POA: Insufficient documentation

## 2022-10-26 DIAGNOSIS — I10 Essential (primary) hypertension: Secondary | ICD-10-CM

## 2022-10-26 DIAGNOSIS — I4729 Other ventricular tachycardia: Secondary | ICD-10-CM

## 2022-10-26 DIAGNOSIS — I2510 Atherosclerotic heart disease of native coronary artery without angina pectoris: Secondary | ICD-10-CM | POA: Insufficient documentation

## 2022-10-26 DIAGNOSIS — R809 Proteinuria, unspecified: Secondary | ICD-10-CM

## 2022-10-26 DIAGNOSIS — E1169 Type 2 diabetes mellitus with other specified complication: Secondary | ICD-10-CM

## 2022-10-26 DIAGNOSIS — F419 Anxiety disorder, unspecified: Secondary | ICD-10-CM

## 2022-10-26 DIAGNOSIS — E039 Hypothyroidism, unspecified: Secondary | ICD-10-CM

## 2022-10-26 DIAGNOSIS — E785 Hyperlipidemia, unspecified: Secondary | ICD-10-CM

## 2022-10-26 LAB — BASIC METABOLIC PANEL
Anion gap: 9 (ref 5–15)
BUN: 21 mg/dL (ref 8–23)
CO2: 28 mmol/L (ref 22–32)
Calcium: 8.7 mg/dL — ABNORMAL LOW (ref 8.9–10.3)
Chloride: 103 mmol/L (ref 98–111)
Creatinine, Ser: 1.02 mg/dL (ref 0.61–1.24)
GFR, Estimated: >60 mL/min (ref >60)
Glucose, Bld: 205 mg/dL — ABNORMAL HIGH (ref 70–99)
Potassium: 5.3 mmol/L — ABNORMAL HIGH (ref 3.5–5.1)
Sodium: 140 mmol/L (ref 135–145)

## 2022-10-26 LAB — CBC
HCT: 41.9 % (ref 39.0–52.0)
Hemoglobin: 14.7 g/dL (ref 13.0–17.0)
MCH: 32.8 pg (ref 26.0–34.0)
MCHC: 35.1 g/dL (ref 30.0–36.0)
MCV: 93.5 fL (ref 80.0–100.0)
Platelets: 221 10*3/uL (ref 150–400)
RBC: 4.48 MIL/uL (ref 4.22–5.81)
RDW: 12.9 % (ref 11.5–15.5)
WBC: 10.3 10*3/uL (ref 4.0–10.5)
nRBC: 0 % (ref 0.0–0.2)

## 2022-10-26 NOTE — Patient Instructions (Signed)
Medication Instructions:  Your physician recommends that you continue on your current medications as directed. Please refer to the Current Medication list given to you today.  *If you need a refill on your cardiac medications before your next appointment, please call your pharmacy*   Lab Work: Your provider would like for you to have following labs drawn today BMP, CBC.   If you have labs (blood work) drawn today and your tests are completely normal, you will receive your results only by: MyChart Message (if you have MyChart) OR A paper copy in the mail If you have any lab test that is abnormal or we need to change your treatment, we will call you to review the results.   Testing/Procedures:  [place] A DEPT OF [place] AT [place] [address] [place] [address] Dept: [phone] Loc: [phone]  POLLUX EYE  10/26/2022  You are scheduled for a Cardiac Catheterization on Friday, August 9 with Dr. Cristal Deer End.  1. Please arrive at the Heart & Vascular Center Entrance of [facility], [address], [place] [address] at *will call with time*  (This is 1 hour(s) prior to your procedure time).  Proceed to the Check-In Desk directly inside the entrance.  Procedure Parking: Use the entrance off of the [address] side of the hospital. Turn right upon entering and follow the driveway to parking that is directly in front of the Heart & Vascular Center. There is no valet parking available at this entrance, however there is an awning directly in front of the Heart & Vascular Center for drop off/ pick up for patients.  Special note: Every effort is made to have your procedure done on time. Please understand that emergencies sometimes delay scheduled procedures.  2. Diet: Do not eat solid foods after midnight.  The patient may have clear liquids until 5am upon the day of the procedure.  3. Labs:  You will need to have blood drawn today  4. Medication instructions in preparation for your procedure:   Contrast Allergy: No  Hold the morning of the procedure: Glipizide   Do not take Diabetes Med Glucophage (Metformin) on the day of the procedure and HOLD 48 HOURS AFTER THE PROCEDURE.  On the morning of your procedure, take your Aspirin 81 mg and any morning medicines NOT listed above.  You may use sips of water.  5. Plan to go home the same day, you will only stay overnight if medically necessary. 6. Bring a current list of your medications and current insurance cards. 7. You MUST have a responsible person to drive you home. 8. Someone MUST be with you the first 24 hours after you arrive home or your discharge will be delayed. 9. Please wear clothes that are easy to get on and off and wear slip-on shoes.  Thank you for allowing [place] to care for you!   -- [place] [facility]    Follow-Up: At [facility], you and your health needs are our priority.  As part of our continuing mission to provide you with exceptional heart care, we have created designated Provider Care Teams.  These Care Teams include your primary Cardiologist (physician) and Advanced Practice Providers (APPs -  Physician Assistants and Nurse Practitioners) who all work together to provide you with the care you need, when you need it.  We recommend signing up for the patient portal called "MyChart".  Sign up information  is provided on this After Visit Summary.  MyChart is used to connect with patients for Virtual Visits (Telemedicine).  Patients are able to view lab/test results, encounter notes, upcoming appointments, etc.  Non-urgent messages can be sent to your provider as well.   To learn more about what you can do with MyChart, go to [email or web address].    Your next appointment:   1-2 week post cath    Provider:   You may see Jeremiah Kendall, MD or one of the following  Advanced Practice Providers on your designated Care Team:    Charlsie Quest, NP

## 2022-10-26 NOTE — Progress Notes (Signed)
Cardiology Office Note:  .   Date:  10/26/2022  ID:  [name], DOB 10-12-1960, MRN [id number] PCP: Bosie Clos, MD  [facility] Providers Cardiologist:  Yvonne Kendall, MD Electrophysiologist:  Lanier Prude, MD    History of Present Illness: .   [name] is a 62 y.o. male with past medical history of coronary disease, ventricular tachycardia on sotalol, hypertension, hyperlipidemia, type 2 diabetes, hypoparathyroidism and hypothyroidism who presents today for follow-up of hypertension.  Significant past medical history of coronary artery disease with first myocardial infarction at age 61 to 30-minute with successful PCI/DES of the proximal and distal RCA.  He had follow-up left heart catheterization 11/2008 which demonstrated nonobstructive disease with ejection fraction 55%.  His last evaluation by cardiac catheterization was 09/2011 which showed nonobstructive disease as well.  A longstanding history of symptomatic monomorphic ventricular tachycardia this been treated with Betapace therapy and has been well-controlled.  His last echo was completed 06/17/2026 revealed an LVEF of 40-45% with mildly decreased function, left ventricular demonstrated global echo for kidney cysts with G1 DD, mild mitral valve regurgitation.  He was last seen in clinic on 09/09/2022 by [name].  At that time he reported being fairly well.  He noticed a little more frequent orthostatic lightheadedness since switching from ramipril to [place].  This is typically brief and self-limited lasting about 5 to 10 seconds.  Her blood pressure has been relatively stable.  Was discussed about completing a left heart catheterization to evaluate for any blockages and the patient decided to defer until he returns from his trip to [place].  He returns to clinic today stating that overall he has been feeling fairly well.  He has noticed a couple of twinges in his chest and some palpitations that he has  noticed that he can place his hand over his heart and add slight amount of pressure and then stops.  He has not had any more orthostatic lightheadedness since being on Entresto.  He has had no syncope or near syncopal episodes.  Blood pressure is slightly elevated today but he stated that his blood pressure had been rather well at home.  He denies any other symptoms of shortness of breath, palpitations, peripheral edema.  Denies any hospitalizations or visits to the emergency department but does state that he is anxious today about his visit  ROS: 10 point review of systems has been completed and considered negative with exception of what is been listed in the HPI  Studies Reviewed: Marland Kitchen   EKG Interpretation Date/Time:  Tuesday October 26 2022 15:31:35 EDT Ventricular Rate:  56 PR Interval:  186 QRS Duration:  90 QT Interval:  456 QTC Calculation: 440 R Axis:   -35  Text Interpretation: Sinus bradycardia Left axis deviation Inferior infarct , age undetermined When compared with ECG of 21-May-2022 09:03, PREVIOUS ECG IS PRESENT Confirmed by Charlsie Quest ([id number]) on 10/26/2022 3:51:11 PM   TTE 06/17/22 1. Left ventricular ejection fraction, by estimation, is 40 to 45%. Left  ventricular ejection fraction by 2D MOD biplane is 41.8 %. The left  ventricle has mildly decreased function. The left ventricle demonstrates  global hypokinesis. The left  ventricular internal cavity size was mildly dilated. There is mild left  ventricular hypertrophy. Left ventricular diastolic parameters are  consistent with Grade I diastolic dysfunction (impaired relaxation).   2. Right ventricular systolic function is normal. The right ventricular  size is normal.   3. The mitral valve is normal  in structure. Mild mitral valve  regurgitation. No evidence of mitral stenosis.   4. The aortic valve has an indeterminant number of cusps. Aortic valve  regurgitation is not visualized. No aortic stenosis is present.   5. There  is borderline dilatation of the aortic root, measuring 39 mm.  ascending aorta, measuring 35 mm.   6. The inferior vena cava is normal in size with greater than 50%  respiratory variability, suggesting right atrial pressure of 3 mmHg.  Risk Assessment/Calculations:     HYPERTENSION CONTROL Vitals:   10/26/22 1527 10/26/22 1557  BP: (!) 160/82 (!) 158/86    The patient's blood pressure is elevated above target today.  In order to address the patient's elevated BP: Blood pressure will be monitored at home to determine if medication changes need to be made.          Physical Exam:   VS:  BP (!) 158/86 (BP Location: Left Arm, Patient Position: Sitting, Cuff Size: Normal)   Pulse (!) 56   Ht 5\' 10"  (1.778 m)   Wt 183 lb 3.2 oz (83.1 kg)   SpO2 98%   BMI 26.29 kg/m    Wt Readings from Last 3 Encounters:  10/26/22 183 lb 3.2 oz (83.1 kg)  09/13/22 187 lb 3.2 oz (84.9 kg)  05/31/22 201 lb 3.2 oz (91.3 kg)    GEN: Well nourished, well developed in no acute distress NECK: No JVD; No carotid bruits CARDIAC: RRR, no murmurs, rubs, gallops RESPIRATORY:  Clear to auscultation without rales, wheezing or rhonchi  ABDOMEN: Soft, non-tender, non-distended EXTREMITIES:  No edema; No deformity   ASSESSMENT AND PLAN: .   Coronary artery disease without angina.  EKG today revealed sinus bradycardia with a left axis deviation, no concerns for ischemia noted.  Last echocardiogram showed reduced LVEF.  He was advised to proceed with left heart catheterization to reevaluate function and the patient requested to defer catheterization until return from his trip.  He is here today to discuss the heart catheterization is being scheduled for Friday of this week.  He is continued on aspirin 81 mg daily, and atorvastatin 80 mg daily.  He does have Nitrostat sublingual to take and has not required the use of his Nitrostat.  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  Patient appears to be euvolemic on exam without symptoms of decompensated heart failure.  He is tolerating Entresto better without recurrence of orthostatic lightheadedness.  Blood pressure slightly elevated today but he is nervous today he is continued on Entresto 24/26 mg twice daily.  He has not required diuretic.  Beta-blocker was not started today due to his baseline bradycardia on EKG.  SGLT2 inhibitor was not added today with procedure being scheduled.  Orthostatic lightheadedness has slightly improved we will revisit medication adjustments postprocedure.  Ventricular tachycardia with monomorphic VT noted in 2010.  Felt to potentially be scar mediated.  He has been on sotalol 120 mg twice daily since that time.  With his new moderately reduced EF he may need to be changed to a different medication.  He was previously referred to EP for further recommendations at his last appointment by [name].  Primary hypertension with blood pressure today 160/82 and a recheck of 158/86.  Patient is continued on his Entresto, sotalol, and as needed clonidine.  He stated this morning when he had taken his blood pressure was 110 at home.  He  will continue to monitor his blood pressure we did not make any further changes to his medication today.  Mixed hyperlipidemia associated with type 2 diabetes with his lipids being well-controlled with his last check in March 2024 with an LDL of 52.  He is continued on atorvastatin 80 mg daily and this continues to be managed by his PCP.  Hypothyroidism where he is continued on levothyroxine.  This continues to be managed by his PCP.    Generalized anxiety where he is on sertraline.  He has been advised by the pharmacy that the sertraline can actually block the effectiveness of some of the medications that he is on.  This continues to be managed by his PCP.  We have advised him not to take his sertraline with all of his other  medications since he has some twice daily dosing of his cardiac medications we have advised him to take this medication daily at lunch.    Informed Consent   Shared Decision Making/Informed Consent The risks [stroke (1 in 1000), death (1 in 1000), kidney failure [usually temporary] (1 in 500), bleeding (1 in 200), allergic reaction [possibly serious] (1 in 200)], benefits (diagnostic support and management of coronary artery disease) and alternatives of a cardiac catheterization were discussed in detail with Mr. Silver and he is willing to proceed.     Dispo: Patient to return to clinic to see MD/APP 1 to 2 weeks postprocedure or sooner if needed  Signed,  , NP

## 2022-10-26 NOTE — H&P (View-Only) (Signed)
 Cardiology Office Note:  .   Date:  10/26/2022  ID:  [name], DOB 10-12-1960, MRN [id number] PCP: Jeremiah Clos, MD  [facility] Providers Cardiologist:  Jeremiah Kendall, MD Electrophysiologist:  Jeremiah Prude, MD    History of Present Illness: .   [name] is a 62 y.o. male with past medical history of coronary disease, ventricular tachycardia on sotalol, hypertension, hyperlipidemia, type 2 diabetes, hypoparathyroidism and hypothyroidism who presents today for follow-up of hypertension.  Significant past medical history of coronary artery disease with first myocardial infarction at age 61 to 30-minute with successful PCI/DES of the proximal and distal RCA.  He had follow-up left heart catheterization 11/2008 which demonstrated nonobstructive disease with ejection fraction 55%.  His last evaluation by cardiac catheterization was 09/2011 which showed nonobstructive disease as well.  A longstanding history of symptomatic monomorphic ventricular tachycardia this been treated with Betapace therapy and has been well-controlled.  His last echo was completed 06/17/2026 revealed an LVEF of 40-45% with mildly decreased function, left ventricular demonstrated global echo for kidney cysts with G1 DD, mild mitral valve regurgitation.  He was last seen in clinic on 09/09/2022 by Dr. Okey Miller.  At that time he reported being fairly well.  He noticed a little more frequent orthostatic lightheadedness since switching from ramipril to [place].  This is typically brief and self-limited lasting about 5 to 10 seconds.  Her blood pressure has been relatively stable.  Was discussed about completing a left heart catheterization to evaluate for any blockages and the patient decided to defer until he returns from his trip to [place].  He returns to clinic today stating that overall he has been feeling fairly well.  He has noticed a couple of twinges in his chest and some palpitations that he has  noticed that he can place his hand over his heart and add slight amount of pressure and then stops.  He has not had any more orthostatic lightheadedness since being on Entresto.  He has had no syncope or near syncopal episodes.  Blood pressure is slightly elevated today but he stated that his blood pressure had been rather well at home.  He denies any other symptoms of shortness of breath, palpitations, peripheral edema.  Denies any hospitalizations or visits to the emergency department but does state that he is anxious today about his visit  ROS: 10 point review of systems has been completed and considered negative with exception of what is been listed in the HPI  Studies Reviewed: Marland Kitchen   EKG Interpretation Date/Time:  Tuesday October 26 2022 15:31:35 EDT Ventricular Rate:  56 PR Interval:  186 QRS Duration:  90 QT Interval:  456 QTC Calculation: 440 R Axis:   -35  Text Interpretation: Sinus bradycardia Left axis deviation Inferior infarct , age undetermined When compared with ECG of 21-May-2022 09:03, PREVIOUS ECG IS PRESENT Confirmed by [name] ([id number]) on 10/26/2022 3:51:11 PM   TTE 06/17/22 1. Left ventricular ejection fraction, by estimation, is 40 to 45%. Left  ventricular ejection fraction by 2D MOD biplane is 41.8 %. The left  ventricle has mildly decreased function. The left ventricle demonstrates  global hypokinesis. The left  ventricular internal cavity size was mildly dilated. There is mild left  ventricular hypertrophy. Left ventricular diastolic parameters are  consistent with Grade I diastolic dysfunction (impaired relaxation).   2. Right ventricular systolic function is normal. The right ventricular  size is normal.   3. The mitral valve is normal  in structure. Mild mitral valve  regurgitation. No evidence of mitral stenosis.   4. The aortic valve has an indeterminant number of cusps. Aortic valve  regurgitation is not visualized. No aortic stenosis is present.   5. There  is borderline dilatation of the aortic root, measuring 39 mm.  ascending aorta, measuring 35 mm.   6. The inferior vena cava is normal in size with greater than 50%  respiratory variability, suggesting right atrial pressure of 3 mmHg.  Risk Assessment/Calculations:     HYPERTENSION CONTROL Vitals:   10/26/22 1527 10/26/22 1557  BP: (!) 160/82 (!) 158/86    The patient's blood pressure is elevated above target today.  In order to address the patient's elevated BP: Blood pressure will be monitored at home to determine if medication changes need to be made.          Physical Exam:   VS:  BP (!) 158/86 (BP Location: Left Arm, Patient Position: Sitting, Cuff Size: Normal)   Pulse (!) 56   Ht 5\' 10"  (1.778 m)   Wt 183 lb 3.2 oz (83.1 kg)   SpO2 98%   BMI 26.29 kg/m    Wt Readings from Last 3 Encounters:  10/26/22 183 lb 3.2 oz (83.1 kg)  09/13/22 187 lb 3.2 oz (84.9 kg)  05/31/22 201 lb 3.2 oz (91.3 kg)    GEN: Well nourished, well developed in no acute distress NECK: No JVD; No carotid bruits CARDIAC: RRR, no murmurs, rubs, gallops RESPIRATORY:  Clear to auscultation without rales, wheezing or rhonchi  ABDOMEN: Soft, non-tender, non-distended EXTREMITIES:  No edema; No deformity   ASSESSMENT AND PLAN: .   Coronary artery disease without angina.  EKG today revealed sinus bradycardia with a left axis deviation, no concerns for ischemia noted.  Last echocardiogram showed reduced LVEF.  He was advised to proceed with left heart catheterization to reevaluate function and the patient requested to defer catheterization until return from his trip.  He is here today to discuss the heart catheterization is being scheduled for Friday of this week.  He is continued on aspirin 81 mg daily, and atorvastatin 80 mg daily.  He does have Nitrostat sublingual to take and has not required the use of his Nitrostat.  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  Patient appears to be euvolemic on exam without symptoms of decompensated heart failure.  He is tolerating Entresto better without recurrence of orthostatic lightheadedness.  Blood pressure slightly elevated today but he is nervous today he is continued on Entresto 24/26 mg twice daily.  He has not required diuretic.  Beta-blocker was not started today due to his baseline bradycardia on EKG.  SGLT2 inhibitor was not added today with procedure being scheduled.  Orthostatic lightheadedness has slightly improved we will revisit medication adjustments postprocedure.  Ventricular tachycardia with monomorphic VT noted in 2010.  Felt to potentially be scar mediated.  He has been on sotalol 120 mg twice daily since that time.  With his new moderately reduced EF he may need to be changed to a different medication.  He was previously referred to EP for further recommendations at his last appointment by Dr. Okey Miller.  Primary hypertension with blood pressure today 160/82 and a recheck of 158/86.  Patient is continued on his Entresto, sotalol, and as needed clonidine.  He stated this morning when he had taken his blood pressure was 110 at home.  He  will continue to monitor his blood pressure we did not make any further changes to his medication today.  Mixed hyperlipidemia associated with type 2 diabetes with his lipids being well-controlled with his last check in March 2024 with an LDL of 52.  He is continued on atorvastatin 80 mg daily and this continues to be managed by his PCP.  Hypothyroidism where he is continued on levothyroxine.  This continues to be managed by his PCP.    Generalized anxiety where he is on sertraline.  He has been advised by the pharmacy that the sertraline can actually block the effectiveness of some of the medications that he is on.  This continues to be managed by his PCP.  We have advised him not to take his sertraline with all of his other  medications since he has some twice daily dosing of his cardiac medications we have advised him to take this medication daily at lunch.    Informed Consent   Shared Decision Making/Informed Consent The risks [stroke (1 in 1000), death (1 in 1000), kidney failure [usually temporary] (1 in 500), bleeding (1 in 200), allergic reaction [possibly serious] (1 in 200)], benefits (diagnostic support and management of coronary artery disease) and alternatives of a cardiac catheterization were discussed in detail with [name] and he is willing to proceed.     Dispo: Patient to return to clinic to see MD/APP 1 to 2 weeks postprocedure or sooner if needed  Signed, SHERI HAMMOCK, NP

## 2022-10-27 ENCOUNTER — Ambulatory Visit: Payer: BC Managed Care – PPO | Admitting: Cardiology

## 2022-10-27 ENCOUNTER — Telehealth: Payer: Self-pay

## 2022-10-27 ENCOUNTER — Other Ambulatory Visit: Payer: Self-pay | Admitting: Cardiology

## 2022-10-27 DIAGNOSIS — I2510 Atherosclerotic heart disease of native coronary artery without angina pectoris: Secondary | ICD-10-CM

## 2022-10-27 NOTE — Telephone Encounter (Signed)
Notified patient that his LHC was scheduled for 8/9 at 8:30 and that he needs to arrive at 7:30. Patient given directions to heart and vascular. Patient verbalized understanding and no further questions at this time.

## 2022-10-27 NOTE — Progress Notes (Signed)
CBC results are stable to no signs of infection or bleeding noted. Potassium slightly elevated. Decrease potassium rich foods in diet, increase water intake, avoid power drinks and Gatorade.Will need recheck BMP prior to procedure.

## 2022-10-29 ENCOUNTER — Encounter: Admission: RE | Payer: Self-pay | Source: Home / Self Care

## 2022-10-29 ENCOUNTER — Ambulatory Visit
Admission: RE | Admit: 2022-10-29 | Payer: BC Managed Care – PPO | Source: Home / Self Care | Admitting: Internal Medicine

## 2022-10-29 DIAGNOSIS — I2510 Atherosclerotic heart disease of native coronary artery without angina pectoris: Secondary | ICD-10-CM

## 2022-10-29 SURGERY — LEFT HEART CATH AND CORONARY ANGIOGRAPHY
Anesthesia: Moderate Sedation | Laterality: Left

## 2022-11-02 ENCOUNTER — Telehealth: Payer: Self-pay

## 2022-11-02 DIAGNOSIS — E875 Hyperkalemia: Secondary | ICD-10-CM

## 2022-11-02 NOTE — Telephone Encounter (Signed)
Pt returning call

## 2022-11-02 NOTE — Telephone Encounter (Signed)
Patient schedule for LHC on 8/22 at 9:30 with Dr. Herbie Baltimore. Patient agreeable and verbalized understanding. Patient instructed to come into our office this week to have BMP checked to see what his potassium level is now since the last result was elevated. Patient was supposed to have this lab rechecked before his cath that was cancelled last Friday.

## 2022-11-02 NOTE — Telephone Encounter (Signed)
Called patient to see what date he is available to reschedule his LHC and notify him that insurance has approved the authorization.

## 2022-11-02 NOTE — Addendum Note (Signed)
Addended by: Vivi Barrack on: 11/02/2022 03:13 PM   Modules accepted: Orders

## 2022-11-05 DIAGNOSIS — E875 Hyperkalemia: Secondary | ICD-10-CM | POA: Diagnosis not present

## 2022-11-08 ENCOUNTER — Telehealth: Payer: Self-pay

## 2022-11-08 NOTE — Progress Notes (Signed)
Pre-procedure labs with mildly increased potassium level. Unchanged from previous lab draws.Will recheck after his upcoming procedure.

## 2022-11-08 NOTE — Telephone Encounter (Signed)
Left detailed VM advising patient that his cath on 8/22 will now start at 10:30 and he needs to arrive by 9:30.   Sent [facility] message as well.

## 2022-11-11 ENCOUNTER — Encounter: Payer: Self-pay | Admitting: Cardiology

## 2022-11-11 ENCOUNTER — Ambulatory Visit
Admission: RE | Admit: 2022-11-11 | Discharge: 2022-11-11 | Disposition: A | Discharge status: Home / Self Care | Payer: BC Managed Care – PPO | Attending: Cardiology | Admitting: Cardiology

## 2022-11-11 ENCOUNTER — Encounter
Admission: RE | Disposition: A | Discharge status: Home / Self Care | Payer: Self-pay | Source: Home / Self Care | Attending: Cardiology

## 2022-11-11 ENCOUNTER — Other Ambulatory Visit: Payer: Self-pay

## 2022-11-11 DIAGNOSIS — I2510 Atherosclerotic heart disease of native coronary artery without angina pectoris: Secondary | ICD-10-CM

## 2022-11-11 DIAGNOSIS — I10 Essential (primary) hypertension: Secondary | ICD-10-CM | POA: Diagnosis not present

## 2022-11-11 DIAGNOSIS — I2582 Chronic total occlusion of coronary artery: Secondary | ICD-10-CM | POA: Diagnosis not present

## 2022-11-11 DIAGNOSIS — E039 Hypothyroidism, unspecified: Secondary | ICD-10-CM | POA: Diagnosis not present

## 2022-11-11 DIAGNOSIS — Z955 Presence of coronary angioplasty implant and graft: Secondary | ICD-10-CM | POA: Insufficient documentation

## 2022-11-11 DIAGNOSIS — I25118 Atherosclerotic heart disease of native coronary artery with other forms of angina pectoris: Secondary | ICD-10-CM | POA: Diagnosis not present

## 2022-11-11 DIAGNOSIS — R931 Abnormal findings on diagnostic imaging of heart and coronary circulation: Secondary | ICD-10-CM | POA: Diagnosis not present

## 2022-11-11 DIAGNOSIS — F411 Generalized anxiety disorder: Secondary | ICD-10-CM | POA: Diagnosis not present

## 2022-11-11 DIAGNOSIS — E1169 Type 2 diabetes mellitus with other specified complication: Secondary | ICD-10-CM | POA: Insufficient documentation

## 2022-11-11 DIAGNOSIS — Z79899 Other long term (current) drug therapy: Secondary | ICD-10-CM | POA: Insufficient documentation

## 2022-11-11 DIAGNOSIS — I252 Old myocardial infarction: Secondary | ICD-10-CM | POA: Diagnosis not present

## 2022-11-11 DIAGNOSIS — I5020 Unspecified systolic (congestive) heart failure: Secondary | ICD-10-CM | POA: Diagnosis not present

## 2022-11-11 DIAGNOSIS — E782 Mixed hyperlipidemia: Secondary | ICD-10-CM | POA: Diagnosis not present

## 2022-11-11 DIAGNOSIS — Z79890 Hormone replacement therapy (postmenopausal): Secondary | ICD-10-CM | POA: Diagnosis not present

## 2022-11-11 DIAGNOSIS — I472 Ventricular tachycardia, unspecified: Secondary | ICD-10-CM | POA: Diagnosis not present

## 2022-11-11 HISTORY — PX: LEFT HEART CATH AND CORONARY ANGIOGRAPHY: CATH118249

## 2022-11-11 LAB — GLUCOSE, CAPILLARY
Glucose-Capillary: 188 mg/dL — ABNORMAL HIGH (ref 70–99)
Glucose-Capillary: 166 mg/dL — ABNORMAL HIGH (ref 70–99)

## 2022-11-11 SURGERY — LEFT HEART CATH AND CORONARY ANGIOGRAPHY
Anesthesia: Moderate Sedation | Laterality: Left

## 2022-11-11 MED ORDER — HEPARIN SODIUM (PORCINE) 1000 UNIT/ML IJ SOLN
INTRAMUSCULAR | Status: AC
  Filled 2022-11-11: qty 10

## 2022-11-11 MED ORDER — VERAPAMIL HCL 2.5 MG/ML IV SOLN
INTRAVENOUS | Status: DC | PRN
  Administered 2022-11-11: 2.5 mg via INTRA_ARTERIAL

## 2022-11-11 MED ORDER — VERAPAMIL HCL 2.5 MG/ML IV SOLN
INTRAVENOUS | Status: AC
  Filled 2022-11-11: qty 2

## 2022-11-11 MED ORDER — SODIUM CHLORIDE 0.9 % IV SOLN: 250.0000 mL | INTRAVENOUS | Status: DC | PRN

## 2022-11-11 MED ORDER — SODIUM CHLORIDE 0.9% FLUSH: 3.0000 mL | INTRAVENOUS | Status: DC | PRN

## 2022-11-11 MED ORDER — LIDOCAINE HCL (PF) 1 % IJ SOLN
INTRAMUSCULAR | Status: DC | PRN
  Administered 2022-11-11: 2 mL

## 2022-11-11 MED ORDER — FENTANYL CITRATE (PF) 100 MCG/2ML IJ SOLN
INTRAMUSCULAR | Status: AC
  Filled 2022-11-11: qty 2

## 2022-11-11 MED ORDER — HEPARIN (PORCINE) IN NACL 1000-0.9 UT/500ML-% IV SOLN
INTRAVENOUS | Status: DC | PRN
  Administered 2022-11-11 (×2): 500 mL

## 2022-11-11 MED ORDER — FENTANYL CITRATE (PF) 100 MCG/2ML IJ SOLN
INTRAMUSCULAR | Status: DC | PRN
  Administered 2022-11-11: 25 ug via INTRAVENOUS

## 2022-11-11 MED ORDER — SODIUM CHLORIDE 0.9% FLUSH: 3.0000 mL | Freq: Two times a day (BID) | INTRAVENOUS | Status: DC

## 2022-11-11 MED ORDER — MIDAZOLAM HCL 2 MG/2ML IJ SOLN
INTRAMUSCULAR | Status: AC
  Filled 2022-11-11: qty 2

## 2022-11-11 MED ORDER — MIDAZOLAM HCL 2 MG/2ML IJ SOLN
INTRAMUSCULAR | Status: DC | PRN
  Administered 2022-11-11: 1 mg via INTRAVENOUS

## 2022-11-11 MED ORDER — IOHEXOL 300 MG/ML  SOLN
INTRAMUSCULAR | Status: DC | PRN
  Administered 2022-11-11: 50 mL

## 2022-11-11 MED ORDER — ACETAMINOPHEN 325 MG PO TABS: 650.0000 mg | ORAL_TABLET | ORAL | Status: DC | PRN

## 2022-11-11 MED ORDER — SODIUM CHLORIDE 0.9 % IV SOLN: INTRAVENOUS | Status: DC

## 2022-11-11 MED ORDER — ONDANSETRON HCL 4 MG/2ML IJ SOLN: 4.0000 mg | Freq: Four times a day (QID) | INTRAMUSCULAR | Status: DC | PRN

## 2022-11-11 MED ORDER — HEPARIN SODIUM (PORCINE) 1000 UNIT/ML IJ SOLN
INTRAMUSCULAR | Status: DC | PRN
  Administered 2022-11-11: 4000 [IU] via INTRAVENOUS

## 2022-11-11 MED ORDER — ASPIRIN 81 MG PO CHEW: 81.0000 mg | CHEWABLE_TABLET | ORAL | Status: DC

## 2022-11-11 MED ORDER — LIDOCAINE HCL 1 % IJ SOLN
INTRAMUSCULAR | Status: AC
  Filled 2022-11-11: qty 20

## 2022-11-11 MED ORDER — SODIUM CHLORIDE 0.9 % WEIGHT BASED INFUSION: 1.0000 mL/kg/h | INTRAVENOUS | Status: DC

## 2022-11-11 MED ORDER — HEPARIN (PORCINE) IN NACL 1000-0.9 UT/500ML-% IV SOLN
INTRAVENOUS | Status: AC
  Filled 2022-11-11: qty 1000

## 2022-11-11 MED ORDER — LABETALOL HCL 5 MG/ML IV SOLN: 10.0000 mg | INTRAVENOUS | Status: DC | PRN

## 2022-11-11 MED ORDER — HYDRALAZINE HCL 20 MG/ML IJ SOLN: 10.0000 mg | INTRAMUSCULAR | Status: DC | PRN

## 2022-11-11 SURGICAL SUPPLY — 10 items
CATH 5F 110X4 TIG (CATHETERS) IMPLANT
DEVICE RAD TR BAND REGULAR (VASCULAR PRODUCTS) IMPLANT
DRAPE BRACHIAL (DRAPES) IMPLANT
GLIDESHEATH SLEND SS 6F .021 (SHEATH) IMPLANT
GUIDEWIRE INQWIRE 1.5J.035X260 (WIRE) IMPLANT
INQWIRE 1.5J .035X260CM (WIRE) ×1
PACK CARDIAC CATH (CUSTOM PROCEDURE TRAY) ×1 IMPLANT
PROTECTION STATION PRESSURIZED (MISCELLANEOUS) ×1
SET ATX-X65L (MISCELLANEOUS) IMPLANT
STATION PROTECTION PRESSURIZED (MISCELLANEOUS) IMPLANT

## 2022-11-11 NOTE — Progress Notes (Signed)
Dr. Herbie Baltimore removed a total of 4 ml from TR band now: remaining 6 ml air in TR band. No ooze, hematoma, drainage at site. Right arm elevated on pillow.

## 2022-11-11 NOTE — Interval H&P Note (Signed)
History and Physical Interval Note:  11/11/2022 11:18 AM  [name]  has presented today for surgery, with the diagnosis of L Cath  Reduced ejection fraction.  The various methods of treatment have been discussed with the patient and family. After consideration of risks, benefits and other options for treatment, the patient has consented to  Procedure(s): LEFT HEART CATH AND CORONARY ANGIOGRAPHY (Left)  PERCUTANEOUS CORONARY INTERVENTION   as a surgical intervention.  The patient's history has been reviewed, patient examined, no change in status, stable for surgery.  I have reviewed the patient's chart and labs.  Questions were answered to the patient's satisfaction.   Cath Lab Visit (complete for each Cath Lab visit)  Clinical Evaluation Leading to the Procedure:   ACS: No.  Non-ACS:    Anginal Classification: CCS I  Anti-ischemic medical therapy: Maximal Therapy (2 or more classes of medications)  Non-Invasive Test Results: Equivocal test results  Prior CABG: No previous CABG    [name]

## 2022-11-11 NOTE — Brief Op Note (Signed)
   BRIEF CARDIAC CATHETERIZATION REPORT  11/11/2022 12:10 PM  PATIENT: [name]   MRN: [id number]  PCP: Bosie Clos, MD  [facility] Cardiologist:  Yvonne Kendall, MD; APP: Hedy Camara, NP Electrophysiologist:  Lanier Prude, MD     SURGEON:  Surgeons and Role: Marykay Lex, MD - Primary  PROCEDURE:  Procedure(s): LEFT HEART CATH AND CORONARY ANGIOGRAPHY (Left)  PATIENT:  [name]  62 y.o. male with PMH notable for CAD (DES PCI to proximal and distal RCA/RPL in 2006.  Follow-up cath in 2010 and then 2013 were stable.  Echo in 2028 showed EF 40 to 45% with mild MR.  Global HK.  Seen September 09, 2022 by Dr. Cameron Proud well.  Mild orthostasis.  Mild chest discomfort but self-limiting.  Described as twinges off and on.  With reduced EF, they discussed possibility of ischemic evaluation with cardiac catheterization.  He delayed this until after her trip to [place].  Was seen then on 10/26/2022 by Hedy Camara, NP to schedule for cardiac catheterization.  PRE-OPERATIVE DIAGNOSIS:  L Cath  Reduced Ejection Fraction  POST-OPERATIVE DIAGNOSIS:  Severe single-vessel CAD with proximal RCA CTO proximal to proximal RCA stent, also noted distal overlapping RCA stents into the RPL.  There are left-to-right collaterals filling the PDA and PL system from septal perforators and a large OM1. Essentially normal left system with LAD and small diagonal branch, large RI and bifurcating else X with OM1 and LPL 1. Mildly reduced EF of roughly 40 to 45% with inferior hypokinesis.  Normal LVEDP.   PROCEDURE PERFORMED  Time Out: Verified patient identification, verified procedure, site/side was marked, verified correct patient position, special equipment/implants available, medications/allergies/relevent history reviewed, required imaging and test results available. Performed.  Access:  RIGHT radial Artery: 6 Fr sheath -- Seldinger technique using Micropuncture Kit --  Direct ultrasound guidance used.  Permanent image obtained and placed on chart. -- 10 mL radial cocktail IA; 4000 units IV Heparin  Left Heart Catheterization: 5 Fr TIG 4.0 catheter was advanced or exchanged over a J-wire under direct fluoroscopic guidance into the ascending aorta; the catheter advanced across the aortic valve first for left ventricular hemodynamics and LV gram.  It was then pulled back across aortic valve for pullback gradient.  The catheter was then used to engage first the Right Coronary Artery followed by the Left Coronary Artery for selective coronary cineangiography.   Review of initial angiography revealed: RCA CTO  Preparations are made for medical  Upon completion of Angiogaphy, the catheter was removed completely out of the body over a wire, without complication.  Radial sheath removed in the Cardiac Catheterization lab with TR Band placed for hemostasis.  TR Band: 1200  Hours; 10 mL air; reverse Barbeau B  MEDICATIONS Sedation: 1 mg Versed, 25 mg fentanyl SQ Lidocaine 3 mL Radial Cocktail: 3 mg Verapmil in 10 mL NS Heparin: 4000 units   EBL: <50 mL  COUNTS:  YES  PATIENT DISPOSITION:  PACU - hemodynamically stable.  DICTATION: .Note written in EPIC  PLAN OF CARE: Discharge to home after PACU    [name], MD

## 2022-11-11 NOTE — Progress Notes (Signed)
Dr. Herbie Baltimore at bedside, speaking with pt. And his wife re: cath results. Both verbalize understanding of conversation.

## 2022-11-12 ENCOUNTER — Encounter: Payer: Self-pay | Admitting: Cardiology

## 2022-11-15 ENCOUNTER — Encounter: Payer: Self-pay | Admitting: Cardiology

## 2022-11-15 ENCOUNTER — Ambulatory Visit: Payer: BC Managed Care – PPO | Attending: Cardiology | Admitting: Cardiology

## 2022-11-15 VITALS — BP 122/78 | HR 63 | Ht 70.0 in | Wt 179.4 lb

## 2022-11-15 DIAGNOSIS — I5020 Unspecified systolic (congestive) heart failure: Secondary | ICD-10-CM | POA: Diagnosis not present

## 2022-11-15 DIAGNOSIS — E785 Hyperlipidemia, unspecified: Secondary | ICD-10-CM

## 2022-11-15 DIAGNOSIS — E875 Hyperkalemia: Secondary | ICD-10-CM | POA: Diagnosis not present

## 2022-11-15 DIAGNOSIS — E1169 Type 2 diabetes mellitus with other specified complication: Secondary | ICD-10-CM

## 2022-11-15 DIAGNOSIS — Z7984 Long term (current) use of oral hypoglycemic drugs: Secondary | ICD-10-CM

## 2022-11-15 DIAGNOSIS — I2510 Atherosclerotic heart disease of native coronary artery without angina pectoris: Secondary | ICD-10-CM

## 2022-11-15 DIAGNOSIS — I4729 Other ventricular tachycardia: Secondary | ICD-10-CM | POA: Diagnosis not present

## 2022-11-15 DIAGNOSIS — I10 Essential (primary) hypertension: Secondary | ICD-10-CM | POA: Diagnosis not present

## 2022-11-15 DIAGNOSIS — E039 Hypothyroidism, unspecified: Secondary | ICD-10-CM

## 2022-11-15 MED ORDER — DAPAGLIFLOZIN PROPANEDIOL 10 MG PO TABS: 10.0000 mg | ORAL_TABLET | Freq: Every day | ORAL | 3 refills | Status: DC

## 2022-11-15 NOTE — Progress Notes (Signed)
Cardiology Office Note:  .   Date:  11/15/2022  ID:  [name], DOB 1960/04/25, MRN [id number] PCP: Bosie Clos, MD  [facility] Providers Cardiologist:  Yvonne Kendall, MD Electrophysiologist:  Lanier Prude, MD    History of Present Illness: .   [name] is a 62 y.o. male with a past medical history of coronary artery disease, ventricular tachycardia on sotalol, hypertension, hyperlipidemia, type 2 diabetes, hypoparathyroidism and hypothyroidism, who presents today for follow-up of his hypertension and recent left heart catheterization.  Significant past medical history of coronary artery disease with first myocardial infarction at age 39 and underwent successful PCI/DES to the proximal distal RCA.  He had follow-up left heart catheterization/2010 which demonstrated nonobstructive disease with an ejection fraction of 55%.  Evaluation by cardiac catheterization in 09/2011 showed nonobstructive disease as well.  Longstanding history of symptomatic monomorphic ventricular tachycardia that has been treated with sotalol therapy and has been well-controlled.  Echocardiogram in 06/17/2022 revealed an EF of 40-45% with mildly decreased function, left ventricular demonstrated global hypokinesis with G1 DD and mild mitral valve regurgitation.  Last heart catheterization was completed 11/11/2022 which revealed severe single-vessel CAD with proximal RCA CTO proximal proximal RCA stent also noted distal overlap and RCA stents into the RPL.  There was left-to-right collaterals filling the PDA and PL system from septal perforators and a large OM1.  Mildly reduced EF of roughly 40-45% with inferior hypokinesis with normal LVEDP.  Continued on aspirin for moderate CAD and continue with the titration of GDMT.  He was last seen in clinic 10/26/2022.  At that time he had overall been feeling very well.  He continued to have chest discomfort with palpitation.  Had not had any more orthostatic  lightheadedness since the change to his medications.  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  No further medication changes were made at that time as he would prefer to defer until after the procedure was completed.  He is here today for follow-up stating that overall he has been feeling fairly well.  He denies any recurrent chest discomfort, shortness of breath, or peripheral edema.  He has not had any more episodes of orthostatic lightheadedness since being on Entresto.  He denies any syncope or near syncope.  States that he occasionally had a dizzy spell that is not unchanged prior to [place] starting.  He has not had any issues with his right wrist since his procedure.  Has been compliant with his medications.  ROS: Point review of system has been completed and considered negative with exception of what is been listed in the HPI  Studies Reviewed: Marland Kitchen   EKG Interpretation Date/Time:  Monday November 15 2022 11:03:01 EDT Ventricular Rate:  63 PR Interval:  168 QRS Duration:  90 QT Interval:  416 QTC Calculation: 425 R Axis:   -34  Text Interpretation: Normal sinus rhythm Left axis deviation Inferior infarct (cited on or before 26-Oct-2022) When compared with ECG of 26-Oct-2022 15:31, No significant change was found Confirmed by Charlsie Quest ([id number]) on 11/15/2022 11:09:57 AM   LHC 11/11/22   Prox RCA to Dist RCA lesion is 100% stenosed including the previously placed Prox-mid RCA stent   Dist RCA - RPAV overlapping Stents are100% stenosed. RPL & RPDA fill via L-R collateral   There is mild to moderate left ventricular systolic dysfunction.  The left ventricular ejection fraction is 35-45% by visual estimate.  LV end diastolic pressure is normal.  POST-OPERATIVE DIAGNOSIS:  Severe single-vessel CAD with proximal RCA CTO proximal to proximal RCA stent, also noted distal overlapping RCA stents into the RPL.  There are left-to-right collaterals filling the PDA and PL  system from septal perforators and a large OM1. Essentially normal left system with LAD and small diagonal branch, large RI and bifurcating else X with OM1 and LPL 1. Mildly reduced EF of roughly 40 to 45% with inferior hypokinesis.  Normal LVEDP.  PLAN OF CARE: Discharge to home after PACU ; Recommend Aspirin 81mg  daily for moderate CAD.  Continue titration of GDMT for CAD and HFmrEF  TTE 06/17/22 1. Left ventricular ejection fraction, by estimation, is 40 to 45%. Left  ventricular ejection fraction by 2D MOD biplane is 41.8 %. The left  ventricle has mildly decreased function. The left ventricle demonstrates  global hypokinesis. The left  ventricular internal cavity size was mildly dilated. There is mild left  ventricular hypertrophy. Left ventricular diastolic parameters are  consistent with Grade I diastolic dysfunction (impaired relaxation).   2. Right ventricular systolic function is normal. The right ventricular  size is normal.   3. The mitral valve is normal in structure. Mild mitral valve  regurgitation. No evidence of mitral stenosis.   4. The aortic valve has an indeterminant number of cusps. Aortic valve  regurgitation is not visualized. No aortic stenosis is present.   5. There is borderline dilatation of the aortic root, measuring 39 mm.  ascending aorta, measuring 35 mm.   6. The inferior vena cava is normal in size with greater than 50%  respiratory variability, suggesting right atrial pressure of 3 mmHg.  Risk Assessment/Calculations:             Physical Exam:   VS:  BP 122/78 (BP Location: Left Arm, Patient Position: Sitting, Cuff Size: Normal)   Pulse 63   Ht 5\' 10"  (1.778 m)   Wt 179 lb 6.4 oz (81.4 kg)   SpO2 97%   BMI 25.74 kg/m    Wt Readings from Last 3 Encounters:  11/15/22 179 lb 6.4 oz (81.4 kg)  11/11/22 179 lb (81.2 kg)  10/26/22 183 lb 3.2 oz (83.1 kg)    GEN: Well nourished, well developed in no acute distress NECK: No JVD; No carotid  bruits CARDIAC: RRR, no murmurs, rubs, gallops RESPIRATORY:  Clear to auscultation without rales, wheezing or rhonchi  ABDOMEN: Soft, non-tender, non-distended EXTREMITIES:  No edema; No deformity, Right radial cath site with 2+ radial pulse, small amount of bruising without hematoma   ASSESSMENT AND PLAN: .   Coronary artery disease without angina.  Recent left heart catheterization revealed proximal RCA and distal RCA lesion 100% stenosed including the previously placed proximal mid RCA stent, distal RCA VAV overlapping stents were 100% stenosis.  RPL and RPDA filled via left-to-right collaterals.  Left ventricular ejection fraction 35 to 45% by visual estimate.  LV end-diastolic pressure is normal with recommendation to continue on aspirin 81 mg daily for moderate CAD and continue with titration of GDMT for CAD and HFmrEF.  Patient continues to remain chest pain-free today.  He is continued on aspirin 81 mg daily and atorvastatin 80 mg daily.  He has been sent for follow-up BMP post procedure.  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  Patient appears to be euvolemic on exam without symptoms of decompensated heart failure.  He is tolerating Entresto without recurrence of orthostatic lightheadedness.  Continued on Entresto 24/26 milligrams  twice daily.  He has not required any further diuretic therapy.  Beta-blocker was not started due to baseline bradycardia.  SGLT2 inhibitor Farxiga 10 mg daily is being started today and he has have been encouraged to continue to monitor his blood pressures and to notify us for any increase in dizziness or lightheadedness.  Advised that typically does not cause hypoglycemia as he has restarted his self on glipizide he has been encouraged to continue to monitor his blood sugars at home.  Will continue to defer MRA therapy as he keeps high potassium levels on blood work with his last serum potassium 5.4.  Ventricular  tachycardia with monomorphic VT noted since 2010.  Felt to be potentially a scar mediated.  He is continued on sotalol 120 mg twice daily.  He has a follow-up with the EP scheduled for September.  Primary hypertension with blood pressure 122/78.  Blood pressures are well-controlled on his Entresto and sotalol.  He is encouraged to continue to monitor his blood pressures at home hours post medication administration.  Mixed hyperlipidemia with associated type 2 diabetes with lipids being well-controlled/echo March 2024 with LDL 52.  He is continued on atorvastatin 80 mg daily this continues to be followed by his PCP.  He did have questions today related to lowering of his HDL related to his sotalol.  Hypothyroidism where he is continued to be on levothyroxine 150 mcg before breakfast.  This continues to be managed by his PCP.       Dispo: Patient to return to clinic to see MD/APP in 6 weeks or sooner if needed  Signed, Tavi Hoogendoorn, NP

## 2022-11-15 NOTE — Patient Instructions (Addendum)
Medication Instructions:   Start Taking: Farxiga 10 mg once daily  *If you need a refill on your cardiac medications before your next appointment, please call your pharmacy*   Lab Work: Your provider would like for you to have following labs drawn today BMP.    If you have labs (blood work) drawn today and your tests are completely normal, you will receive your results only by: MyChart Message (if you have MyChart) OR A paper copy in the mail If you have any lab test that is abnormal or we need to change your treatment, we will call you to review the results.   Testing/Procedures: none   Follow-Up: At [facility], you and your health needs are our priority.  As part of our continuing mission to provide you with exceptional heart care, we have created designated Provider Care Teams.  These Care Teams include your primary Cardiologist (physician) and Advanced Practice Providers (APPs -  Physician Assistants and Nurse Practitioners) who all work together to provide you with the care you need, when you need it.  We recommend signing up for the patient portal called "MyChart".  Sign up information is provided on this After Visit Summary.  MyChart is used to connect with patients for Virtual Visits (Telemedicine).  Patients are able to view lab/test results, encounter notes, upcoming appointments, etc.  Non-urgent messages can be sent to your provider as well.   To learn more about what you can do with MyChart, go to [email or web address].    Your next appointment:   6 week(s)  Provider:   Charlsie Quest, NP

## 2022-11-16 LAB — BASIC METABOLIC PANEL
BUN/Creatinine Ratio: 17 (ref 10–24)
BUN: 20 mg/dL (ref 8–27)
CO2: 24 mmol/L (ref 20–29)
Calcium: 9.5 mg/dL (ref 8.6–10.2)
Chloride: 99 mmol/L (ref 96–106)
Creatinine, Ser: 1.16 mg/dL (ref 0.76–1.27)
Glucose: 177 mg/dL — ABNORMAL HIGH (ref 70–99)
Potassium: 5.6 mmol/L — ABNORMAL HIGH (ref 3.5–5.2)
Sodium: 141 mmol/L (ref 134–144)
eGFR: 70 mL/min/{1.73_m2} (ref >59)

## 2022-11-17 ENCOUNTER — Other Ambulatory Visit: Payer: Self-pay

## 2022-11-17 DIAGNOSIS — E875 Hyperkalemia: Secondary | ICD-10-CM

## 2022-11-17 NOTE — Progress Notes (Signed)
Potassium levels still remain elevated.  Recommend decreasing high potassium foods, salt substitutes, any electrolyte infused drinks such as Gatorade, Powerade, liquid IV.  Increase water.  Repeat BMP in 1 week.

## 2022-11-22 ENCOUNTER — Other Ambulatory Visit: Payer: Self-pay | Admitting: Physician Assistant

## 2022-11-23 NOTE — Telephone Encounter (Signed)
Patient of Dr. End. Please review for refill. Thank you!  

## 2022-11-26 ENCOUNTER — Telehealth: Payer: Self-pay

## 2022-11-26 NOTE — Telephone Encounter (Signed)
Called patient to remind him to come into our office to have his labs drawn. Advised patient of the time the lab is open and that no appointment is needed.

## 2022-12-03 DIAGNOSIS — E875 Hyperkalemia: Secondary | ICD-10-CM | POA: Diagnosis not present

## 2022-12-04 LAB — BASIC METABOLIC PANEL
BUN/Creatinine Ratio: 16 (ref 10–24)
BUN: 16 mg/dL (ref 8–27)
CO2: 24 mmol/L (ref 20–29)
Calcium: 9.2 mg/dL (ref 8.6–10.2)
Chloride: 103 mmol/L (ref 96–106)
Creatinine, Ser: 0.97 mg/dL (ref 0.76–1.27)
Glucose: 166 mg/dL — ABNORMAL HIGH (ref 70–99)
Potassium: 5.1 mmol/L (ref 3.5–5.2)
Sodium: 142 mmol/L (ref 134–144)
eGFR: 87 mL/min/{1.73_m2} (ref >59)

## 2022-12-06 ENCOUNTER — Ambulatory Visit: Payer: BC Managed Care – PPO | Admitting: Cardiology

## 2022-12-07 NOTE — Progress Notes (Signed)
Potassium levels have improved, kidney function remains stable, blood glucose has improved.  Continue current medication regimen without any changes needed at this time.

## 2022-12-12 NOTE — Progress Notes (Unsigned)
Electrophysiology Office Note:    Date:  12/14/2022   ID:  [name], DOB 01/23/1961, MRN [id number]  [facility] Cardiologist:  Yvonne Kendall, MD  [facility] Electrophysiologist:  Lanier Prude, MD   Referring MD: Bosie Clos, MD   Chief Complaint: VT, sotalol  History of Present Illness:    [name] is a 62 y.o. malewho I am seeing today for an evaluation of VT at the request of Dr End.  The patient was last seen by [facility] hammock on 11/15/2022.  The patient has a medical history that includes CAD, VT on sotalol, HTN, HLD, DM, hypoparathyroidism.  He has a long history of VT on sotalol. Heart cath 11/11/2022 shows CTO of the RCA. EF 40%.           Their past medical, social and family history was reveiwed.   ROS:   Please see the history of present illness.    All other systems reviewed and are negative.  EKGs/Labs/Other Studies Reviewed:    The following studies were reviewed today:  11/11/2022 [facility] Severe single-vessel CAD with proximal RCA CTO proximal to proximal RCA stent, also noted distal overlapping RCA stents into the RPL.  There are left-to-right collaterals filling the PDA and PL system from septal perforators and a large OM1. Essentially normal left system with LAD and small diagonal branch, large RI and bifurcating else X with OM1 and LPL 1. Mildly reduced EF of roughly 40 to 45% with inferior hypokinesis.  Normal LVEDP.  06/17/2022 Echo EF 40 RV normal Mild MR  11/15/2022 ECG shows sinus, inferior infarct   EKG Interpretation Date/Time:  Tuesday December 14 2022 10:29:23 EDT Ventricular Rate:  61 PR Interval:  192 QRS Duration:  98 QT Interval:  436 QTC Calculation: 438 R Axis:   -41  Text Interpretation: Normal sinus rhythm Left axis deviation Inferior infarct Confirmed by Steffanie Dunn [phone]) on 12/14/2022 11:03:46 AM    Physical Exam:    VS:  BP 126/72   Pulse 67   Ht 5\' 10"  (1.778 m)   Wt 182 lb  (82.6 kg)   SpO2 97%   BMI 26.11 kg/m     Wt Readings from Last 3 Encounters:  12/14/22 182 lb (82.6 kg)  11/15/22 179 lb 6.4 oz (81.4 kg)  11/11/22 179 lb (81.2 kg)     GEN:  Well nourished, well developed in no acute distress CARDIAC: RRR, no murmurs, rubs, gallops RESPIRATORY:  Clear to auscultation without rales, wheezing or rhonchi       ASSESSMENT AND PLAN:    1. Ventricular tachycardia, monomorphic (HCC)   2. HFrEF (heart failure with reduced ejection fraction) (HCC)   3. Essential (primary) hypertension   4. Coronary artery disease involving native coronary artery of native heart without angina pectoris     #VT #high risk med use - sotalol Electrically quiescent on sotalol. QTc acceptable for continued sotalol use.  He is on sertraline which can interact but given he has been on this medication for years without incident and his QT is normal, I think it is reasonable for him to continue on both with careful monitoring of the QT interval every 4 months.  The pros and cons of this strategy were discussed in detail with the patient and he is in agreement with the plan.  #CAD #Inferior infarct #RCA CTO No ischemic symptoms. Likely the driver of his monomorphic VT.  #Hypertension At goal today.  Recommend checking blood pressures 1-2  times per week at home and recording the values.  Recommend bringing these recordings to the primary care physician.  #HFrEF NYHA II. Warm and dry. Continue current medical therapy.  Follow up with APP in 4 months.      Signed, Rossie Muskrat. Lalla Brothers, MD, [facility], [facility] 12/14/2022 11:11 AM    Electrophysiology [facility]

## 2022-12-14 ENCOUNTER — Encounter: Payer: Self-pay | Admitting: Cardiology

## 2022-12-14 ENCOUNTER — Other Ambulatory Visit: Payer: Self-pay | Admitting: Family Medicine

## 2022-12-14 ENCOUNTER — Ambulatory Visit: Payer: BC Managed Care – PPO | Attending: Cardiology | Admitting: Cardiology

## 2022-12-14 VITALS — BP 126/72 | HR 67 | Ht 70.0 in | Wt 182.0 lb

## 2022-12-14 DIAGNOSIS — F330 Major depressive disorder, recurrent, mild: Secondary | ICD-10-CM

## 2022-12-14 DIAGNOSIS — I4729 Other ventricular tachycardia: Secondary | ICD-10-CM

## 2022-12-14 DIAGNOSIS — I10 Essential (primary) hypertension: Secondary | ICD-10-CM | POA: Diagnosis not present

## 2022-12-14 DIAGNOSIS — F411 Generalized anxiety disorder: Secondary | ICD-10-CM

## 2022-12-14 DIAGNOSIS — I2510 Atherosclerotic heart disease of native coronary artery without angina pectoris: Secondary | ICD-10-CM

## 2022-12-14 DIAGNOSIS — I5020 Unspecified systolic (congestive) heart failure: Secondary | ICD-10-CM

## 2022-12-14 NOTE — Patient Instructions (Signed)
Medication Instructions:  Your physician recommends that you continue on your current medications as directed. Please refer to the Current Medication list given to you today.  *If you need a refill on your cardiac medications before your next appointment, please call your pharmacy*   Lab Work: None ordered.  If you have labs (blood work) drawn today and your tests are completely normal, you will receive your results only by: MyChart Message (if you have MyChart) OR A paper copy in the mail If you have any lab test that is abnormal or we need to change your treatment, we will call you to review the results.   Testing/Procedures: None ordered.    Follow-Up: At [facility], you and your health needs are our priority.  As part of our continuing mission to provide you with exceptional heart care, we have created designated Provider Care Teams.  These Care Teams include your primary Cardiologist (physician) and Advanced Practice Providers (APPs -  Physician Assistants and Nurse Practitioners) who all work together to provide you with the care you need, when you need it.  We recommend signing up for the patient portal called "MyChart".  Sign up information is provided on this After Visit Summary.  MyChart is used to connect with patients for Virtual Visits (Telemedicine).  Patients are able to view lab/test results, encounter notes, upcoming appointments, etc.  Non-urgent messages can be sent to your provider as well.   To learn more about what you can do with MyChart, go to [email or web address].    Your next appointment:   4 months with Dr Lovena Neighbours NP, Luella Cook

## 2022-12-14 NOTE — Telephone Encounter (Signed)
Rx 02/05/22 #90 3RF- too soon, provider no longer at practice  Requested Prescriptions  Pending Prescriptions Disp Refills   lamoTRIgine (LAMICTAL) 200 MG tablet [Pharmacy Med Name: LAMOTRIGINE 200 MG TABLET] 90 tablet 3    Sig: TAKE 1 TABLET BY MOUTH EVERY DAY     Neurology:  Anticonvulsants - lamotrigine Passed - 12/14/2022  2:35 AM      Passed - Cr in normal range and within 360 days    Creat  Date Value Ref Range Status  01/05/2017 0.93 0.70 - 1.33 mg/dL Final    Comment:    For patients >49 years of age, the reference limit for Creatinine is approximately 13% higher for people identified as African-American. .    Creatinine, Ser  Date Value Ref Range Status  12/03/2022 0.97 0.76 - 1.27 mg/dL Final         Passed - ALT in normal range and within 360 days    ALT  Date Value Ref Range Status  05/21/2022 17 0 - 44 U/L Final         Passed - AST in normal range and within 360 days    AST  Date Value Ref Range Status  05/21/2022 18 15 - 41 U/L Final         Passed - Completed PHQ-2 or PHQ-9 in the last 360 days      Passed - Valid encounter within last 12 months    Recent Outpatient Visits           10 months ago Anxiety, generalized   [place] [facility] Malva Limes, MD   1 year ago Annual physical exam   [place] [facility] Bosie Clos, MD   1 year ago Essential hypertension   [place] [facility] Bosie Clos, MD   1 year ago Type 2 diabetes mellitus with other specified complication, without long-term current use of insulin [facility])   [place] [facility] Bosie Clos, MD   2 years ago Type 2 diabetes mellitus with other specified complication, without long-term current use of insulin (HCC)   [place] [facility] Bosie Clos, MD       Future Appointments             In 2 weeks Charlsie Quest, NP [facility] [place]   In 4  months Sherie Don, NP [facility]

## 2022-12-17 DIAGNOSIS — E875 Hyperkalemia: Secondary | ICD-10-CM | POA: Diagnosis not present

## 2022-12-17 DIAGNOSIS — Z1389 Encounter for screening for other disorder: Secondary | ICD-10-CM | POA: Diagnosis not present

## 2022-12-17 DIAGNOSIS — E039 Hypothyroidism, unspecified: Secondary | ICD-10-CM | POA: Diagnosis not present

## 2022-12-17 DIAGNOSIS — I25118 Atherosclerotic heart disease of native coronary artery with other forms of angina pectoris: Secondary | ICD-10-CM | POA: Diagnosis not present

## 2022-12-17 DIAGNOSIS — Z125 Encounter for screening for malignant neoplasm of prostate: Secondary | ICD-10-CM | POA: Diagnosis not present

## 2022-12-17 DIAGNOSIS — F411 Generalized anxiety disorder: Secondary | ICD-10-CM | POA: Diagnosis not present

## 2022-12-17 DIAGNOSIS — Z0000 Encounter for general adult medical examination without abnormal findings: Secondary | ICD-10-CM | POA: Diagnosis not present

## 2022-12-17 DIAGNOSIS — E118 Type 2 diabetes mellitus with unspecified complications: Secondary | ICD-10-CM | POA: Diagnosis not present

## 2022-12-23 DIAGNOSIS — Z1389 Encounter for screening for other disorder: Secondary | ICD-10-CM | POA: Diagnosis not present

## 2022-12-28 ENCOUNTER — Encounter: Payer: Self-pay | Admitting: Cardiology

## 2022-12-28 ENCOUNTER — Ambulatory Visit: Payer: BC Managed Care – PPO | Attending: Cardiology | Admitting: Cardiology

## 2022-12-28 ENCOUNTER — Other Ambulatory Visit: Payer: Self-pay | Admitting: Cardiology

## 2022-12-28 VITALS — BP 136/74 | HR 60 | Ht 70.0 in | Wt 190.2 lb

## 2022-12-28 DIAGNOSIS — I5022 Chronic systolic (congestive) heart failure: Secondary | ICD-10-CM | POA: Diagnosis not present

## 2022-12-28 DIAGNOSIS — I4729 Other ventricular tachycardia: Secondary | ICD-10-CM | POA: Diagnosis not present

## 2022-12-28 DIAGNOSIS — I2510 Atherosclerotic heart disease of native coronary artery without angina pectoris: Secondary | ICD-10-CM

## 2022-12-28 DIAGNOSIS — E1169 Type 2 diabetes mellitus with other specified complication: Secondary | ICD-10-CM | POA: Diagnosis not present

## 2022-12-28 DIAGNOSIS — E039 Hypothyroidism, unspecified: Secondary | ICD-10-CM

## 2022-12-28 DIAGNOSIS — E785 Hyperlipidemia, unspecified: Secondary | ICD-10-CM

## 2022-12-28 DIAGNOSIS — I10 Essential (primary) hypertension: Secondary | ICD-10-CM

## 2022-12-28 MED ORDER — SOTALOL HCL (AF) 120 MG PO TABS: 1.0000 | ORAL_TABLET | Freq: Two times a day (BID) | ORAL | 3 refills | Status: DC

## 2022-12-28 MED ORDER — ENTRESTO 24-26 MG PO TABS: 1.0000 | ORAL_TABLET | Freq: Two times a day (BID) | ORAL | 3 refills | Status: DC

## 2022-12-28 NOTE — Progress Notes (Signed)
Cardiology Office Note:  .   Date:  12/28/2022  ID:  [name], DOB 27-Jul-1960, MRN [id number] PCP: Bosie Clos, MD  [facility] Providers Cardiologist:  Yvonne Kendall, MD Electrophysiologist:  Lanier Prude, MD    History of Present Illness: .   [name] is a 62 y.o. male with a past medical history of coronary disease, ventricular tachycardia on sotalol, hypertension, hyperlipidemia, type 2 diabetes, hypoparathyroidism, hypothyroidism, who presents today for follow-up.  Significant past medical history of coronary artery disease with first myocardial infarction at the age of 80 and underwent successful PCI/DES of the proximal distal RCA.  Follow-up left heart catheterization in 2010 which demonstrated nonobstructive disease and ejection fraction 55%.  Evaluation by cardiac catheterization 09/2011 showed nonobstructive disease as well.  Longstanding history of symptomatic monomorphic ventricular tachycardia that has been treated with sotalol therapy and has been well-controlled.  Echocardiogram 06/17/2022 revealed an EF of 40-45% with mildly decreased function, left ventricle demonstrated global hypokinesis, G1 DD, mild mitral valve regurgitation.  Heart catheterization completed 11/11/2022 revealed single-vessel CAD with proximal RCA CTO proximal RCA stent was also noted distal overlapping RCA stents into the RPL.  There was left-to-right collaterals filling the PDA and the PL system from septal perforators and a large OM1.  Mildly reduced EF is roughly 40-45% with inferior hypokinesis and normal LVEDP.  He is continued on aspirin for moderate CAD and continue with the titration of GDMT.  He was last seen in clinic 11/15/2022 at that time he had been doing fairly well from a cardiac perspective.  He denied any recurrent chest discomfort.  Had not had any more episodes of orthostatic lightheadedness since being on Entresto.  He had been started on Farxiga 10 mg daily with  repeat blood work.  He returns to clinic today stating that overall he has been doing well from a cardiac perspective.  He did have recent follow-up with the EP and was advised that since he has been on sotalol for an extended period of time without side effects and he will be continued on the current medication regimen without changes at this time.  Unfortunately previous visit he was started on Farxiga but due to cost he he was unable to afford the [place] and is placed back on his glipizide.  He denies any chest discomfort, shortness of breath, orthopnea, PND, lightheadedness/dizziness, or peripheral edema.  States he has been compliant with his current occasional.  Also noted PCP had just drawn lab's with a stable potassium level.  ROS: 10 point review of systems has been reviewed and considered negative with exception of what is been listed in the HPI  Studies Reviewed: Marland Kitchen   EKG Interpretation Date/Time:  Tuesday December 28 2022 09:02:32 EDT Ventricular Rate:  60 PR Interval:  194 QRS Duration:  108 QT Interval:  450 QTC Calculation: 450 R Axis:   -34  Text Interpretation: Sinus rhythm with occasional Premature ventricular complexes Left axis deviation Inferior infarct (cited on or before 26-Oct-2022) When compared with ECG of 14-Dec-2022 10:29, Premature ventricular complexes are now Present Confirmed by Charlsie Quest ([id number]) on 12/28/2022 9:13:09 AM    LHC 11/11/22   Prox RCA to Dist RCA lesion is 100% stenosed including the previously placed Prox-mid RCA stent   Dist RCA - RPAV overlapping Stents are100% stenosed. RPL & RPDA fill via L-R collateral   There is mild to moderate left ventricular systolic dysfunction.  The left ventricular ejection fraction is 35-45% by  visual estimate.  LV end diastolic pressure is normal.   POST-OPERATIVE DIAGNOSIS:  Severe single-vessel CAD with proximal RCA CTO proximal to proximal RCA stent, also noted distal overlapping RCA stents into the RPL.  There  are left-to-right collaterals filling the PDA and PL system from septal perforators and a large OM1. Essentially normal left system with LAD and small diagonal branch, large RI and bifurcating else X with OM1 and LPL 1. Mildly reduced EF of roughly 40 to 45% with inferior hypokinesis.  Normal LVEDP.   PLAN OF CARE: Discharge to home after PACU ; Recommend Aspirin 81mg  daily for moderate CAD.  Continue titration of GDMT for CAD and HFmrEF   TTE 06/17/22 1. Left ventricular ejection fraction, by estimation, is 40 to 45%. Left  ventricular ejection fraction by 2D MOD biplane is 41.8 %. The left  ventricle has mildly decreased function. The left ventricle demonstrates  global hypokinesis. The left  ventricular internal cavity size was mildly dilated. There is mild left  ventricular hypertrophy. Left ventricular diastolic parameters are  consistent with Grade I diastolic dysfunction (impaired relaxation).   2. Right ventricular systolic function is normal. The right ventricular  size is normal.   3. The mitral valve is normal in structure. Mild mitral valve  regurgitation. No evidence of mitral stenosis.   4. The aortic valve has an indeterminant number of cusps. Aortic valve  regurgitation is not visualized. No aortic stenosis is present.   5. There is borderline dilatation of the aortic root, measuring 39 mm.  ascending aorta, measuring 35 mm.   6. The inferior vena cava is normal in size with greater than 50%  respiratory variability, suggesting right atrial pressure of 3 mmHg.  Risk Assessment/Calculations:             Physical Exam:   VS:  BP 136/74 (BP Location: Left Arm, Patient Position: Sitting, Cuff Size: Normal)   Pulse 60   Ht 5\' 10"  (1.778 m)   Wt 190 lb 3.2 oz (86.3 kg)   SpO2 99%   BMI 27.29 kg/m    Wt Readings from Last 3 Encounters:  12/28/22 190 lb 3.2 oz (86.3 kg)  12/14/22 182 lb (82.6 kg)  11/15/22 179 lb 6.4 oz (81.4 kg)    GEN: Well nourished, well developed  in no acute distress NECK: No JVD; No carotid bruits CARDIAC: RRR, no murmurs, rubs, gallops RESPIRATORY:  Clear to auscultation without rales, wheezing or rhonchi  ABDOMEN: Soft, non-tender, non-distended EXTREMITIES:  No edema; No deformity   ASSESSMENT AND PLAN: .   Coronary artery disease of native coronary artery without angina.  Most recent catheterization revealed proximal RCA distal RCA lesion 100% stenosis in clinic previously placed proximal mid RCA stent, distal RCA with overlapping stents were 100% stenosis RPL and RPDA filled via left-to-right collaterals.  LVEF was 35-45% by visual estimate.  LV end-diastolic pressure was normal.  He continues to remain chest pain-free without any symptoms of anginal anginal equivalents.  He has been continued on aspirin 81 mg daily, and atorvastatin 80 mg daily.  EKG done today revealed sinus rhythm with rate of 60 with a left axis deviation and occasional unifocal PVC with no further changes noted.  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  Patient appears to be euvolemic on exam without symptoms of decompensated heart failure.  He is tolerating Entresto well without recurrence of orthostatic lightheadedness.  He is continued on Entresto 24/26 mg twice daily.  He  is not currently on beta-blocker due to baseline bradycardia.  SGLT2 inhibitor Marcelline Deist was started at his last visit unfortunately due to price he was unable to start this medication.  He has also not a candidate for MRA therapy due to borderline high normal potassium levels.  He has been encouraged to continue to monitor his weight, fluid and take, and sodium.  History of ventricular tachycardia.  Patient has been on sotalol therapy monitoring 20 mg twice daily for an extended period of time without any recurrence of events.  He has recently followed up with EP and was advised to continue on his current regimen as he has had no further events or negative side effects from the  medication.  Hypertension with blood pressure today 152/60, repeat was 136/74.  Patient's blood pressure log at home typically runs 120s to 130s.  He states he was late this morning which increased his blood pressure.  He has been continued on his Entresto and sotalol.  He has been encouraged to continue to monitor his pressure 1 to 2 hours postmedication administration at home.  Mixed hyperlipidemia with associated type 2 diabetes with lipids being well-controlled.  Last LDL 52.  He has been continued on atorvastatin 80 mg daily.  Hemoglobin A1c 6.8.  He is continued on metformin and glipizide.  This continues to be managed by his PCP.  Hypothyroidism where he is continued on levothyroxine at 50 mcg daily that continues to be managed by his PCP as well.       Dispo: Patient to return to clinic to see MD/APP in 6 months or sooner if needed.  Signed, Emersyn Wyss, NP

## 2022-12-28 NOTE — Patient Instructions (Signed)
Medication Instructions:  No changes at this time.   *If you need a refill on your cardiac medications before your next appointment, please call your pharmacy*   Lab Work: None  If you have labs (blood work) drawn today and your tests are completely normal, you will receive your results only by: Impact (if you have MyChart) OR A paper copy in the mail If you have any lab test that is abnormal or we need to change your treatment, we will call you to review the results.   Testing/Procedures: None   Follow-Up: At [facility], you and your health needs are our priority.  As part of our continuing mission to provide you with exceptional heart care, we have created designated Provider Care Teams.  These Care Teams include your primary Cardiologist (physician) and Advanced Practice Providers (APPs -  Physician Assistants and Nurse Practitioners) who all work together to provide you with the care you need, when you need it.   Your next appointment:   6 month(s)  Provider:   Nelva Bush, MD or Gerrie Nordmann, NP

## 2022-12-28 NOTE — Telephone Encounter (Signed)
Please advise 

## 2022-12-28 NOTE — Telephone Encounter (Signed)
Yes. We have discussed this and he has been on these medications for several years. He was also just evaluated by EP who continued him on his current regimen as well. Thanks

## 2022-12-29 ENCOUNTER — Telehealth: Payer: Self-pay | Admitting: Cardiology

## 2022-12-29 NOTE — Telephone Encounter (Signed)
OK to fill. Per Dr Lalla Brothers, EKG should be monitored every 4 months

## 2022-12-29 NOTE — Telephone Encounter (Signed)
Pt c/o medication issue:  1. Name of Medication: SOTALOL AF 120 MG TABS   2. How are you currently taking this medication (dosage and times per day)? Not currently taking  3. Are you having a reaction (difficulty breathing--STAT)? No   4. What is your medication issue? There is an interaction between this medication and sertraline. Pharmacy is needing provider confirmation to go through with filling prescription.

## 2022-12-29 NOTE — Telephone Encounter (Signed)
Called pharmacy advised of message below.  Pharmacy tech reviewed and verbalized understanding, thankful for call back.

## 2023-01-02 ENCOUNTER — Other Ambulatory Visit: Payer: Self-pay | Admitting: Internal Medicine

## 2023-01-09 ENCOUNTER — Other Ambulatory Visit: Payer: Self-pay | Admitting: Internal Medicine

## 2023-01-14 ENCOUNTER — Other Ambulatory Visit: Payer: Self-pay

## 2023-01-14 ENCOUNTER — Telehealth: Payer: Self-pay | Admitting: Internal Medicine

## 2023-01-14 MED ORDER — ENTRESTO 24-26 MG PO TABS: 1.0000 | ORAL_TABLET | Freq: Two times a day (BID) | ORAL | 3 refills | Status: DC

## 2023-01-14 NOTE — Telephone Encounter (Signed)
Pt c/o medication issue:  1. Name of Medication:   sacubitril-valsartan (ENTRESTO) 24-26 MG    2. How are you currently taking this medication (dosage and times per day)?   Take 1 tablet by mouth 2 (two) times daily.    3. Are you having a reaction (difficulty breathing--STAT)? No  4. What is your medication issue? Pt states someone in our office told [facility] pharmacy that the above medication was no longer needed and the pt would like to speak with a nurse about this. Please advise

## 2023-01-14 NOTE — Telephone Encounter (Signed)
Called patient, advised that medication was sent over to the pharmacy on 12/28/2022, confirmation received by pharmacy on this day. Resent medication over today.   Advised with patient to call us if he had any issues.   Thank you!

## 2023-01-24 ENCOUNTER — Other Ambulatory Visit: Payer: Self-pay

## 2023-01-24 ENCOUNTER — Telehealth: Payer: Self-pay | Admitting: Internal Medicine

## 2023-01-24 MED ORDER — ENTRESTO 24-26 MG PO TABS: 1.0000 | ORAL_TABLET | Freq: Two times a day (BID) | ORAL | 3 refills | Status: DC

## 2023-01-24 NOTE — Telephone Encounter (Signed)
Resent medication to pharmacy. Confirmed this was the correct one to send it it per patient and pharmacy.    Disp Refills Start End   sacubitril-valsartan (ENTRESTO) 24-26 MG 180 tablet 3 01/24/2023 --   Sig - Route: Take 1 tablet by mouth 2 (two) times daily. - Oral   Sent to pharmacy as: sacubitril-valsartan (ENTRESTO) 24-26 MG   E-Prescribing Status: Receipt confirmed by pharmacy (01/24/2023  1:17 PM EST)    Pharmacy  [email or web address] - [place], [place] - [address]

## 2023-01-24 NOTE — Telephone Encounter (Signed)
Message left for call back at the "[facility] pharmacy" number  Is this something that pharm/refills can handle?  Thank you  Danelle Earthly

## 2023-01-24 NOTE — Telephone Encounter (Signed)
Pt c/o medication issue:  1. Name of Medication:   sacubitril-valsartan (ENTRESTO) 24-26 MG    2. How are you currently taking this medication (dosage and times per day)? As prescribed   3. Are you having a reaction (difficulty breathing--STAT)? No   4. What is your medication issue? Patient states [facility] pharmacy has still not received this prescription. The pharmacy requested our office call to give a verbal order of the refill over the phone. The contact number for this is [phone]. He would like a callback once the phone call has been made so he is aware due to being on his last few tablets. Please advise.

## 2023-04-13 NOTE — Progress Notes (Addendum)
Electrophysiology Clinic Note    Date:  04/15/2023  Patient ID:  [name], [name] [birth year], MRN [id number] PCP:  Bosie Clos, MD  Cardiologist:  Yvonne Kendall, MD Electrophysiologist: Lanier Prude, MD   Discussed the use of AI scribe software for clinical note transcription with the patient, who gave verbal consent to proceed.   Patient Profile    Chief Complaint: VT, sotalol mgmt  History of Present Illness: [name] is a 63 y.o. male with PMH notable for CAD, VT, HFrEF, HTN, HLD, T2DM, hypoparathyroidism; seen today for Lanier Prude, MD for routine electrophysiology followup.  He last saw Dr. Lalla Brothers 11/2022 for EP consult. He has been on sotalol for his MM VT for many years. EKG was stable at that visit.   On follow-up today, he is doing well. Denies chest pain, chest pressure, palpitations. He does notice extra "hiccup" heart beats if he checks his pulse, but does not notice throughout his day. BP is 120s/70s at rest by home readings. He gets nervous at MD appts and BP is frequently elevated.  Takes sotalol BID, no missed doses.  He is seeing PCP next week.    AAD History: Sotalol     ROS:  Please see the history of present illness. All other systems are reviewed and otherwise negative.    Physical Exam    VS:  BP (!) 144/82   Pulse (!) 59   Ht 5\' 10"  (1.778 m)   Wt 190 lb 6.4 oz (86.4 kg)   SpO2 98%   BMI 27.32 kg/m  BMI: Body mass index is 27.32 kg/m.  Vitals:   04/15/23 0931 04/15/23 0956  BP: (!) 158/70 (!) 144/82  Pulse: (!) 59   Height: 5\' 10"  (1.778 m)   Weight: 190 lb 6.4 oz (86.4 kg)   SpO2: 98%   BMI (Calculated): 27.32     Wt Readings from Last 3 Encounters:  04/15/23 190 lb 6.4 oz (86.4 kg)  12/28/22 190 lb 3.2 oz (86.3 kg)  12/14/22 182 lb (82.6 kg)     GEN- The patient is well appearing, alert and oriented x 3 today.   Lungs- Clear to ausculation bilaterally, normal work of breathing.  Heart- Regular  rate and rhythm with rare ectopy, no murmurs, rubs or gallops Extremities- No peripheral edema, warm, dry    Studies Reviewed   Previous EP, cardiology notes.    EKG is ordered. Personal review of EKG from today shows:     EKG Interpretation Date/Time:  Friday April 15 2023 09:35:55 EST Ventricular Rate:  59 PR Interval:  184 QRS Duration:  108 QT Interval:  436 QTC Calculation: 431 R Axis:   -40  Text Interpretation: Sinus bradycardia with occasional Premature ventricular complexes Left axis deviation Incomplete right bundle branch block Confirmed by Sherie Don [phone]) on 04/15/2023 9:39:24 AM    12/2022 EKG - QTC  LHC, 11/11/2022   Prox RCA to Dist RCA lesion is 100% stenosed including the previously placed Prox-mid RCA stent   Dist RCA - RPAV overlapping Stents are100% stenosed. RPL & RPDA fill via L-R collateral   There is mild to moderate left ventricular systolic dysfunction.  The left ventricular ejection fraction is 35-45% by visual estimate.  LV end diastolic pressure is normal.   POST-OPERATIVE DIAGNOSIS:  Severe single-vessel CAD with proximal RCA CTO proximal to proximal RCA stent, also noted distal overlapping RCA stents into the RPL.  There are left-to-right collaterals  filling the PDA and PL system from septal perforators and a large OM1. Essentially normal left system with LAD and small diagonal branch, large RI and bifurcating else X with OM1 and LPL 1. Mildly reduced EF of roughly 40 to 45% with inferior hypokinesis.  Normal LVEDP.  TTE, 06/17/2022 1. Left ventricular ejection fraction, by estimation, is 40 to 45%. Left ventricular ejection fraction by 2D MOD biplane is 41.8 %. The left ventricle has mildly decreased function. The left ventricle demonstrates global hypokinesis. The left  ventricular internal cavity size was mildly dilated. There is mild left ventricular hypertrophy. Left ventricular diastolic parameters are consistent with Grade I  diastolic dysfunction (impaired relaxation).   2. Right ventricular systolic function is normal. The right ventricular size is normal.   3. The mitral valve is normal in structure. Mild mitral valve regurgitation. No evidence of mitral stenosis.   4. The aortic valve has an indeterminant number of cusps. Aortic valve regurgitation is not visualized. No aortic stenosis is present.   5. There is borderline dilatation of the aortic root, measuring 39 mm. ascending aorta, measuring 35 mm.   6. The inferior vena cava is normal in size with greater than 50% respiratory variability, suggesting right atrial pressure of 3 mmHg.     Assessment and Plan     #) VT #) high risk medication use - sotalol Quiescent on sotalol EKG with stable intervals with 100mg  zoloft Continue sotalol 120mg  BID Discussed medication mgmt to notify prescribers and pharmacies of sotalol use to avoid interactions.  Discussed if zoloft dose increases, will need updated EKG Seeing PCP next week who will likely need labwork. Recommend BMP and Mag  #) HTN Improved on recheck today, patient states he took AM meds about 30 minutes ago Continue to monitor at home  #) CAD No ischemic s/s   #) HFmrEF Euvolemic on exam, seems well-compensated Continue entresto 24-26        Current medicines are reviewed at length with the patient today.   The patient does not have concerns regarding his medicines.  The following changes were made today:  none  Labs/ tests ordered today include:  Orders Placed This Encounter  Procedures   EKG 12-Lead     Disposition: Follow up with Dr. Lalla Brothers or EP APP  4 months    Signed, Sherie Don, NP  04/15/23  9:56 AM  Electrophysiology [facility]

## 2023-04-15 ENCOUNTER — Encounter: Payer: Self-pay | Admitting: Cardiology

## 2023-04-15 ENCOUNTER — Ambulatory Visit: Payer: BC Managed Care – PPO | Attending: Cardiology | Admitting: Cardiology

## 2023-04-15 VITALS — BP 144/82 | HR 59 | Ht 70.0 in | Wt 190.4 lb

## 2023-04-15 DIAGNOSIS — I2510 Atherosclerotic heart disease of native coronary artery without angina pectoris: Secondary | ICD-10-CM

## 2023-04-15 DIAGNOSIS — I5022 Chronic systolic (congestive) heart failure: Secondary | ICD-10-CM | POA: Diagnosis not present

## 2023-04-15 DIAGNOSIS — I4729 Other ventricular tachycardia: Secondary | ICD-10-CM

## 2023-04-15 DIAGNOSIS — I10 Essential (primary) hypertension: Secondary | ICD-10-CM

## 2023-04-15 DIAGNOSIS — Z79899 Other long term (current) drug therapy: Secondary | ICD-10-CM | POA: Diagnosis not present

## 2023-04-15 NOTE — Patient Instructions (Signed)
Medication Instructions:  The current medical regimen is effective;  continue present plan and medications.  *If you need a refill on your cardiac medications before your next appointment, please call your pharmacy*   Lab Work: Please have your PCP order BMET, MAG when they do your blood work, and send [place] results please.  If you have labs (blood work) drawn today and your tests are completely normal, you will receive your results only by: MyChart Message (if you have MyChart) OR A paper copy in the mail If you have any lab test that is abnormal or we need to change your treatment, we will call you to review the results.   Follow-Up: At [facility], you and your health needs are our priority.  As part of our continuing mission to provide you with exceptional heart care, we have created designated Provider Care Teams.  These Care Teams include your primary Cardiologist (physician) and Advanced Practice Providers (APPs -  Physician Assistants and Nurse Practitioners) who all work together to provide you with the care you need, when you need it.  We recommend signing up for the patient portal called "MyChart".  Sign up information is provided on this After Visit Summary.  MyChart is used to connect with patients for Virtual Visits (Telemedicine).  Patients are able to view lab/test results, encounter notes, upcoming appointments, etc.  Non-urgent messages can be sent to your provider as well.   To learn more about what you can do with MyChart, go to [email or web address].    Your next appointment:   4 month(s)  Provider:   Steffanie Dunn, MD or Sherie Don, NP

## 2023-04-26 DIAGNOSIS — I10 Essential (primary) hypertension: Secondary | ICD-10-CM

## 2023-04-26 DIAGNOSIS — I4729 Other ventricular tachycardia: Secondary | ICD-10-CM

## 2023-04-26 DIAGNOSIS — I5022 Chronic systolic (congestive) heart failure: Secondary | ICD-10-CM

## 2023-05-11 ENCOUNTER — Other Ambulatory Visit: Payer: Self-pay | Admitting: Medical

## 2023-05-11 DIAGNOSIS — E782 Mixed hyperlipidemia: Secondary | ICD-10-CM

## 2023-05-23 DIAGNOSIS — I5022 Chronic systolic (congestive) heart failure: Secondary | ICD-10-CM | POA: Diagnosis not present

## 2023-05-23 DIAGNOSIS — I10 Essential (primary) hypertension: Secondary | ICD-10-CM | POA: Diagnosis not present

## 2023-05-23 DIAGNOSIS — I4729 Other ventricular tachycardia: Secondary | ICD-10-CM | POA: Diagnosis not present

## 2023-05-24 LAB — BASIC METABOLIC PANEL
BUN/Creatinine Ratio: 17 (ref 10–24)
BUN: 17 mg/dL (ref 8–27)
CO2: 26 mmol/L (ref 20–29)
Calcium: 9.6 mg/dL (ref 8.6–10.2)
Chloride: 101 mmol/L (ref 96–106)
Creatinine, Ser: 1.02 mg/dL (ref 0.76–1.27)
Glucose: 197 mg/dL — ABNORMAL HIGH (ref 70–99)
Potassium: 5.5 mmol/L — ABNORMAL HIGH (ref 3.5–5.2)
Sodium: 143 mmol/L (ref 134–144)
eGFR: 82 mL/min/{1.73_m2} (ref >59)

## 2023-05-24 LAB — MAGNESIUM: Magnesium: 1.6 mg/dL (ref 1.6–2.3)

## 2023-06-24 ENCOUNTER — Ambulatory Visit: Payer: BC Managed Care – PPO | Admitting: Cardiology

## 2023-07-12 DIAGNOSIS — I25118 Atherosclerotic heart disease of native coronary artery with other forms of angina pectoris: Secondary | ICD-10-CM | POA: Diagnosis not present

## 2023-07-12 DIAGNOSIS — I10 Essential (primary) hypertension: Secondary | ICD-10-CM | POA: Diagnosis not present

## 2023-07-12 DIAGNOSIS — E118 Type 2 diabetes mellitus with unspecified complications: Secondary | ICD-10-CM | POA: Diagnosis not present

## 2023-07-12 DIAGNOSIS — F411 Generalized anxiety disorder: Secondary | ICD-10-CM | POA: Diagnosis not present

## 2023-07-12 DIAGNOSIS — E875 Hyperkalemia: Secondary | ICD-10-CM | POA: Diagnosis not present

## 2023-07-27 DIAGNOSIS — Z1211 Encounter for screening for malignant neoplasm of colon: Secondary | ICD-10-CM | POA: Diagnosis not present

## 2023-08-04 LAB — COLOGUARD: COLOGUARD: NEGATIVE

## 2023-08-09 NOTE — Progress Notes (Signed)
  Electrophysiology Office Follow up Visit Note:    Date:  08/10/2023   ID:  [name], DOB 08-21-1960, MRN [id number]  PCP:  Nikki Barters, MD  [facility] Cardiologist:  Sammy Crisp, MD  [facility] Electrophysiologist:  Boyce Byes, MD    Interval History:     [name] is a 63 y.o. male who presents for a follow up visit.   I last saw the patient in December 14, 2022.  He has a history of ventricular tachycardia, sotalol , hypertension, hyperlipidemia, coronary artery disease.  He has a CTO of the RCA.  He last saw Ottie Blonder in April 15, 2023.  At that appointment he was doing well.  His QTc at that appointment was acceptable for ongoing sotalol  use.  He has been doing well.  He tells me that last Thursday he had an episode that woke him up from sleep where he felt a warmth over his body.  The warmth went all way down to his groin.  He checked his blood pressure and heart rate during the episode.  His blood pressure was in the 140s systolic and his heart rate was in the 80s.  The symptoms lasted for about 10 minutes and then resolved.  He did not feel pain or lightheadedness or dizziness during the episode.  No clear trigger.  He wonders whether or not it could have been anxiety.  He has vivid dreams.   Past medical, surgical, social and family history were reviewed.  ROS:   Please see the history of present illness.    All other systems reviewed and are negative.  EKGs/Labs/Other Studies Reviewed:    The following studies were reviewed today:     EKG Interpretation Date/Time:  Wednesday Aug 10 2023 08:21:27 EDT Ventricular Rate:  61 PR Interval:  180 QRS Duration:  106 QT Interval:  446 QTC Calculation: 448 R Axis:   -41  Text Interpretation: Normal sinus rhythm Confirmed by Harvie Liner [id number]) on 08/10/2023 8:28:16 AM    Physical Exam:    VS:  BP 126/74   Pulse 61   Ht 5\' 10"  (1.778 m)   Wt 191 lb 6.4 oz (86.8 kg)   SpO2 99%    BMI 27.46 kg/m     Wt Readings from Last 3 Encounters:  08/10/23 191 lb 6.4 oz (86.8 kg)  04/15/23 190 lb 6.4 oz (86.4 kg)  12/28/22 190 lb 3.2 oz (86.3 kg)     GEN: no distress CARD: RRR, No MRG RESP: No IWOB. CTAB.      ASSESSMENT:    1. Ventricular tachycardia, monomorphic (HCC)   2. HFrEF (heart failure with reduced ejection fraction) (HCC)   3. Encounter for long-term (current) use of high-risk medication    PLAN:    In order of problems listed above:  #History of ventricular tachycardia #High risk medication use-sotalol  The patient is doing well on sotalol .  He is electrically quiescent. QTc acceptable for ongoing sotalol  use  #Chronic systolic heart failure Continue Entresto .  Last EF 40% in March 2024. Update echocardiogram   Follow-up 6 months with APP.    Signed, Harvie Liner, MD, [facility], [facility] 08/10/2023 8:36 AM    Electrophysiology [facility]

## 2023-08-10 ENCOUNTER — Ambulatory Visit: Attending: Cardiology | Admitting: Cardiology

## 2023-08-10 ENCOUNTER — Encounter: Payer: Self-pay | Admitting: Cardiology

## 2023-08-10 VITALS — BP 126/74 | HR 61 | Ht 70.0 in | Wt 191.4 lb

## 2023-08-10 DIAGNOSIS — Z79899 Other long term (current) drug therapy: Secondary | ICD-10-CM | POA: Diagnosis not present

## 2023-08-10 DIAGNOSIS — I4729 Other ventricular tachycardia: Secondary | ICD-10-CM

## 2023-08-10 DIAGNOSIS — I5020 Unspecified systolic (congestive) heart failure: Secondary | ICD-10-CM

## 2023-08-10 NOTE — Patient Instructions (Addendum)
 Medication Instructions:  Your physician recommends that you continue on your current medications as directed. Please refer to the Current Medication list given to you today.  *If you need a refill on your cardiac medications before your next appointment, please call your pharmacy*  Testing/Procedures: Echocardiogram  Your physician has requested that you have an echocardiogram. Echocardiography is a painless test that uses sound waves to create images of your heart. It provides your doctor with information about the size and shape of your heart and how well your heart's chambers and valves are working. This procedure takes approximately one hour. There are no restrictions for this procedure. Please do NOT wear cologne, perfume, aftershave, or lotions (deodorant is allowed). Please arrive 15 minutes prior to your appointment time.  Please note: We ask at that you not bring children with you during ultrasound (echo/ vascular) testing. Due to room size and safety concerns, children are not allowed in the ultrasound rooms during exams. Our front office staff cannot provide observation of children in our lobby area while testing is being conducted. An adult accompanying a patient to their appointment will only be allowed in the ultrasound room at the discretion of the ultrasound technician under special circumstances. We apologize for any inconvenience.   Follow-Up: At [facility], you and your health needs are our priority.  As part of our continuing mission to provide you with exceptional heart care, our providers are all part of one team.  This team includes your primary Cardiologist (physician) and Advanced Practice Providers or APPs (Physician Assistants and Nurse Practitioners) who all work together to provide you with the care you need, when you need it.  Your next appointment:   6 months  Provider:   Suzann Riddle, NP

## 2023-08-17 ENCOUNTER — Ambulatory Visit: Payer: BC Managed Care – PPO | Admitting: Cardiology

## 2023-08-24 ENCOUNTER — Ambulatory Visit: Attending: Cardiology

## 2023-08-24 DIAGNOSIS — I5020 Unspecified systolic (congestive) heart failure: Secondary | ICD-10-CM

## 2023-08-24 DIAGNOSIS — I4729 Other ventricular tachycardia: Secondary | ICD-10-CM

## 2023-08-24 LAB — ECHOCARDIOGRAM COMPLETE
AR max vel: 3.22 cm2
AV Area VTI: 3.29 cm2
AV Area mean vel: 3.22 cm2
AV Mean grad: 2 mmHg
AV Peak grad: 3.9 mmHg
Ao pk vel: 0.99 m/s
Area-P 1/2: 3.08 cm2
Calc EF: 50.9 %
S' Lateral: 3.8 cm
Single Plane A2C EF: 45.7 %
Single Plane A4C EF: 57.6 %

## 2023-10-10 ENCOUNTER — Telehealth: Payer: Self-pay | Admitting: Internal Medicine

## 2023-10-10 DIAGNOSIS — E782 Mixed hyperlipidemia: Secondary | ICD-10-CM

## 2023-10-10 MED ORDER — SACUBITRIL-VALSARTAN 24-26 MG PO TABS: 1.0000 | ORAL_TABLET | Freq: Two times a day (BID) | ORAL | 0 refills | Status: DC

## 2023-10-10 MED ORDER — SOTALOL HCL (AF) 120 MG PO TABS: 1.0000 | ORAL_TABLET | Freq: Two times a day (BID) | ORAL | 0 refills | Status: DC

## 2023-10-10 MED ORDER — NITROGLYCERIN 0.4 MG SL SUBL: 0.4000 mg | SUBLINGUAL_TABLET | SUBLINGUAL | 0 refills | Status: AC | PRN

## 2023-10-10 MED ORDER — ATORVASTATIN CALCIUM 80 MG PO TABS: 80.0000 mg | ORAL_TABLET | Freq: Every day | ORAL | 0 refills | Status: DC

## 2023-10-10 NOTE — Telephone Encounter (Signed)
*  STAT* If patient is at the pharmacy, call can be transferred to refill team.   1. Which medications need to be refilled? (please list name of each medication and dose if known)  SOTALOL  AF 120 MG TABS  sacubitril -valsartan  (ENTRESTO ) 24-26 MG  atorvastatin  (LIPITOR ) 80 MG tablet  nitroGLYCERIN  (NITROSTAT ) 0.4 MG SL tablet   2. Would you like to learn more about the convenience, safety, & potential cost savings by using the [facility]?      3. Are you open to using the [facility] (Type [facility].  ).   4. Which pharmacy/location (including street and city if local pharmacy) is medication to be sent to? [id number] - [place], [place] - [address] OF [address]    5. Do they need a 30 day or 90 day supply? 90 day

## 2023-10-10 NOTE — Telephone Encounter (Signed)
 RX sent to requested Pharmacy

## 2024-01-30 ENCOUNTER — Other Ambulatory Visit: Payer: Self-pay | Admitting: Internal Medicine

## 2024-02-02 ENCOUNTER — Telehealth: Payer: Self-pay | Admitting: Internal Medicine

## 2024-02-02 MED ORDER — SACUBITRIL-VALSARTAN 24-26 MG PO TABS: 1.0000 | ORAL_TABLET | Freq: Two times a day (BID) | ORAL | 0 refills | Status: DC

## 2024-02-02 NOTE — Telephone Encounter (Signed)
 Pt's medication was sent to pt's pharmacy as requested. Confirmation received.

## 2024-02-02 NOTE — Progress Notes (Signed)
 [name]                                          MRN: [id number]   02/02/2024   The [facility] Quality Team Specialist reviewed this patient medical record for the purposes of chart review for care gap closure. The following were reviewed: abstraction for care gap closure-kidney health evaluation for diabetes:eGFR  and uACR.    [facility]BCI Quality Team

## 2024-02-02 NOTE — Telephone Encounter (Signed)
?*  STAT* If patient is at the pharmacy, call can be transferred to refill team. ? ? ?1. Which medications need to be refilled? (please list name of each medication and dose if known) sacubitril-valsartan (ENTRESTO) 24-26 MG ? ?2. Which pharmacy/location (including street and city if local pharmacy) is medication to be sent to? [id number] - [place], [place] - [address] ? ?3. Do they need a 30 day or 90 day supply? 90 ? ?

## 2024-02-06 NOTE — Progress Notes (Unsigned)
  Electrophysiology Office Follow up Visit Note:    Date:  02/08/2024   ID:  ABAYOMI PATTISON, DOB 05/02/1960, MRN [id number]  PCP:  Bertrum Charlie CROME, MD  [facility] Cardiologist:  Lonni Hanson, MD  [facility] Electrophysiologist:  OLE ONEIDA HOLTS, MD    Interval History:     [name] is a 63 y.o. male who presents for a follow up visit.   I last saw the patient Aug 10, 2023.  He has a history of ventricular tachycardia managed with sotalol .  He has a reduced ejection fraction.  He has a history of severe coronary artery disease.  He tells me that his sotalol  was started in the setting of his acute myocardial infarction.  He tells me that since the sotalol  was started he has not had any arrhythmias.        Past medical, surgical, social and family history were reviewed.  ROS:   Please see the history of present illness.    All other systems reviewed and are negative.  EKGs/Labs/Other Studies Reviewed:    The following studies were reviewed today:  August 24, 2023 echo EF 55-60   EKG Interpretation Date/Time:  Wednesday February 08 2024 10:08:11 EST Ventricular Rate:  66 PR Interval:  178 QRS Duration:  106 QT Interval:  426 QTC Calculation: 446 R Axis:   -36  Text Interpretation: Normal sinus rhythm Left axis deviation Confirmed by Holts Ole [phone]) on 02/08/2024 10:11:27 AM    Physical Exam:    VS:  BP 134/78   Pulse 66   Ht 5' 10 (1.778 m)   Wt 199 lb 12.8 oz (90.6 kg)   SpO2 95%   BMI 28.67 kg/m     Wt Readings from Last 3 Encounters:  02/08/24 199 lb 12.8 oz (90.6 kg)  08/10/23 191 lb 6.4 oz (86.8 kg)  04/15/23 190 lb 6.4 oz (86.4 kg)     GEN: no distress CARD: RRR, No MRG RESP: No IWOB. CTAB.      ASSESSMENT:    1. Encounter for long-term (current) use of high-risk medication   2. Ventricular tachycardia, monomorphic (HCC)   3. HFrEF (heart failure with reduced ejection fraction) (HCC)   4. Coronary artery  disease involving native coronary artery of native heart without angina pectoris    PLAN:    In order of problems listed above:  #Ventricular tachycardia #High risk med monitoring-sotalol  Quiescent.  Has been on sotalol  for many years.  QTc is acceptable for ongoing use on today's EKG  #Chronic systolic heart failure #Coronary artery disease EF has recovered on recent echo NYHA class II.  Warm and dry on exam.  Continue GDMT.  I discussed my upcoming departure from [facility] during today's clinic appointment.  He will continue to follow-up with one of my partners moving forward.  Follow-up 6 months with EP APP.   Signed, Ole Holts, MD, [facility], [facility] 02/08/2024 10:13 AM    Electrophysiology [facility]

## 2024-02-08 ENCOUNTER — Ambulatory Visit: Payer: Self-pay | Attending: Cardiology | Admitting: Cardiology

## 2024-02-08 ENCOUNTER — Encounter: Payer: Self-pay | Admitting: Internal Medicine

## 2024-02-08 ENCOUNTER — Encounter: Payer: Self-pay | Admitting: Cardiology

## 2024-02-08 ENCOUNTER — Ambulatory Visit (INDEPENDENT_AMBULATORY_CARE_PROVIDER_SITE_OTHER): Payer: Self-pay | Admitting: Internal Medicine

## 2024-02-08 VITALS — BP 134/78 | HR 66 | Ht 70.0 in | Wt 199.8 lb

## 2024-02-08 VITALS — BP 120/70 | HR 64 | Ht 70.0 in | Wt 199.8 lb

## 2024-02-08 DIAGNOSIS — I5020 Unspecified systolic (congestive) heart failure: Secondary | ICD-10-CM

## 2024-02-08 DIAGNOSIS — E782 Mixed hyperlipidemia: Secondary | ICD-10-CM

## 2024-02-08 DIAGNOSIS — I10 Essential (primary) hypertension: Secondary | ICD-10-CM

## 2024-02-08 DIAGNOSIS — E1169 Type 2 diabetes mellitus with other specified complication: Secondary | ICD-10-CM

## 2024-02-08 DIAGNOSIS — Z8679 Personal history of other diseases of the circulatory system: Secondary | ICD-10-CM | POA: Diagnosis not present

## 2024-02-08 DIAGNOSIS — I2510 Atherosclerotic heart disease of native coronary artery without angina pectoris: Secondary | ICD-10-CM

## 2024-02-08 DIAGNOSIS — I4729 Other ventricular tachycardia: Secondary | ICD-10-CM | POA: Diagnosis not present

## 2024-02-08 DIAGNOSIS — Z79899 Other long term (current) drug therapy: Secondary | ICD-10-CM

## 2024-02-08 DIAGNOSIS — E785 Hyperlipidemia, unspecified: Secondary | ICD-10-CM

## 2024-02-08 MED ORDER — SACUBITRIL-VALSARTAN 24-26 MG PO TABS: 1.0000 | ORAL_TABLET | Freq: Two times a day (BID) | ORAL | 11 refills | Status: AC

## 2024-02-08 MED ORDER — SOTALOL HCL (AF) 120 MG PO TABS: 1.0000 | ORAL_TABLET | Freq: Two times a day (BID) | ORAL | 3 refills | Status: AC

## 2024-02-08 MED ORDER — ATORVASTATIN CALCIUM 80 MG PO TABS: 80.0000 mg | ORAL_TABLET | Freq: Every day | ORAL | 3 refills | Status: AC

## 2024-02-08 NOTE — Patient Instructions (Signed)
 Medication Instructions:  Your physician recommends that you continue on your current medications as directed. Please refer to the Current Medication list given to you today.  *If you need a refill on your cardiac medications before your next appointment, please call your pharmacy*  Lab Work: No labs ordered today  If you have labs (blood work) drawn today and your tests are completely normal, you will receive your results only by: MyChart Message (if you have MyChart) OR A paper copy in the mail If you have any lab test that is abnormal or we need to change your treatment, we will call you to review the results.  Testing/Procedures: No test ordered today   Follow-Up: At [facility], you and your health needs are our priority.  As part of our continuing mission to provide you with exceptional heart care, our providers are all part of one team.  This team includes your primary Cardiologist (physician) and Advanced Practice Providers or APPs (Physician Assistants and Nurse Practitioners) who all work together to provide you with the care you need, when you need it.  Your next appointment:   1 year(s)  Provider:   You may see Lonni Hanson, MD or one of the following Advanced Practice Providers on your designated Care Team:   Lonni Meager, NP Lesley Maffucci, PA-C Bernardino Bring, PA-C Cadence Morenci, PA-C Tylene Lunch, NP Barnie Hila, NP

## 2024-02-08 NOTE — Progress Notes (Signed)
  Cardiology Office Note:  .   Date:  02/08/2024  ID:  Reyes DELENA Felix, DOB 1960/05/21, MRN [id number] PCP: Bertrum Charlie CROME, MD  [facility] Providers Cardiologist:  Lonni Hanson, MD Electrophysiologist:  OLE ONEIDA HOLTS, MD     History of Present Illness: .   [name] is a 63 y.o. male with history of coronary artery disease with CTO of RCA, heart failure with improved ejection fraction, ventricular tachycardia on sotalol  managed by EP, hypertension, hyperlipidemia, type 2 diabetes mellitus, hypoparathyroidism, and hypothyroidism, who presents for follow-up of CAD, heart failure, and VT.  He was last seen in the general cardiology clinic in 12/2022 by Tylene Lunch, NP, at which time he was doing well from a heart standpoint.  He was seen by Dr. Holts in the EP clinic in May, at which time continuation of sotalol  was recommended.  Repeat echo was obtained, demonstrating improved LVEF to 55-60%.  He saw Dr. Holts this morning, who recommended continuation of current therapy including sotalol .  Mr. Tuccillo reports that he has been feeling fairly well without chest pain, shortness of breath, edema, palpitations, or syncope.  He occasionally has transient orthostatic lightheadedness lasting a second or 2, though he has not fallen.  He walks twice a week, about a mile each time.  He is tolerating his medications well.  ROS: See HPI  Studies Reviewed: SABRA        TTE (08/24/2023): Normal LV size with mild LVH.  LVEF 55-6% with normal wall motion.  Grade 1 diastolic dysfunction.  Normal RV size and function.  Normal biatrial size.  No pericardial effusion.  Trivial tricuspid regurgitation.  Aortic sclerosis without stenosis.  Normal CVP.  Risk Assessment/Calculations:             Physical Exam:   VS:  BP 120/70 (BP Location: Left Arm, Patient Position: Sitting, Cuff Size: Normal)   Pulse 64   Ht 5' 10 (1.778 m)   Wt 199 lb 12.8 oz (90.6 kg)   SpO2 97%   BMI 28.67 kg/m     Wt Readings from Last 3 Encounters:  02/08/24 199 lb 12.8 oz (90.6 kg)  02/08/24 199 lb 12.8 oz (90.6 kg)  08/10/23 191 lb 6.4 oz (86.8 kg)    General:  NAD. Neck: No JVD or HJR. Lungs: Clear to auscultation bilaterally without wheezes or crackles. Heart: Regular rate and rhythm without murmurs, rubs, or gallops. Abdomen: Soft, nontender, nondistended. Extremities: No lower extremity edema.  ASSESSMENT AND PLAN: .    Coronary artery disease: Mr. Athey continues to do well without angina in the setting of his CTO of the RCA.  Continue secondary prevention with aspirin  and rosuvastatin.  Heart failure with improved ejection fraction: LVEF noted to be 40-45% in 05/2022, improved to 55-60% on echocardiogram in 08/2023.  Continue current regimen of Entresto  and sotalol  (in the lieu of other beta-blocker with history of VT).  History of ventricular tachycardia: No symptoms to suggest recurrent VT.  Continue sotalol  at the direction of Dr. Holts with ongoing EP follow-up.  QTc appropriate on today's EKG performed by Dr. Holts.  Hypertension: Blood pressure well-controlled today.  No medication changes at this time.  Hyperlipidemia associated with type 2 diabetes mellitus: Continue atorvastatin  80 mg daily, as LDL was well-controlled on the last check on 01/12/2024 at 57.  Ongoing management of diabetes per Dr. Bertrum.    Dispo: Return to clinic in 1 year.  Signed, Lonni Hanson, MD

## 2024-02-08 NOTE — Patient Instructions (Signed)
 Medication Instructions:  Your physician recommends that you continue on your current medications as directed. Please refer to the Current Medication list given to you today.  *If you need a refill on your cardiac medications before your next appointment, please call your pharmacy*  Follow-Up: At [facility], you and your health needs are our priority.  As part of our continuing mission to provide you with exceptional heart care, our providers are all part of one team.  This team includes your primary Cardiologist (physician) and Advanced Practice Providers or APPs (Physician Assistants and Nurse Practitioners) who all work together to provide you with the care you need, when you need it.  Your next appointment:   6 months  Provider:   Suzann Riddle, NP

## 2024-02-10 ENCOUNTER — Encounter: Payer: Self-pay | Admitting: Internal Medicine

## 2024-02-10 DIAGNOSIS — Z8679 Personal history of other diseases of the circulatory system: Secondary | ICD-10-CM | POA: Insufficient documentation
